# Patient Record
Sex: Female | Born: 1995
Health system: Southern US, Community
[De-identification: ages and names within clinical notes are randomized; demographics above are authoritative.]

## PROBLEM LIST (undated history)

## (undated) DIAGNOSIS — J039 Acute tonsillitis, unspecified: Secondary | ICD-10-CM

## (undated) DIAGNOSIS — K219 Gastro-esophageal reflux disease without esophagitis: Secondary | ICD-10-CM

## (undated) DIAGNOSIS — F329 Major depressive disorder, single episode, unspecified: Secondary | ICD-10-CM

## (undated) DIAGNOSIS — R7303 Prediabetes: Secondary | ICD-10-CM

## (undated) DIAGNOSIS — K760 Fatty (change of) liver, not elsewhere classified: Secondary | ICD-10-CM

## (undated) DIAGNOSIS — F32A Depression, unspecified: Secondary | ICD-10-CM

## (undated) DIAGNOSIS — D649 Anemia, unspecified: Secondary | ICD-10-CM

## (undated) DIAGNOSIS — S93409A Sprain of unspecified ligament of unspecified ankle, initial encounter: Secondary | ICD-10-CM

## (undated) HISTORY — DX: Prediabetes: R73.03

## (undated) HISTORY — DX: Anemia, unspecified: D64.9

## (undated) HISTORY — PX: EYE SURGERY: SHX253

---
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
## 2014-11-23 ENCOUNTER — Encounter (HOSPITAL_COMMUNITY): Payer: Self-pay

## 2014-11-23 ENCOUNTER — Emergency Department (HOSPITAL_COMMUNITY)
Admission: EM | Admit: 2014-11-23 | Discharge: 2014-11-23 | Disposition: A | Discharge status: Home / Self Care | Payer: Self-pay | Attending: Emergency Medicine | Admitting: Emergency Medicine

## 2014-11-23 DIAGNOSIS — J36 Peritonsillar abscess: Secondary | ICD-10-CM | POA: Insufficient documentation

## 2014-11-23 DIAGNOSIS — Z79899 Other long term (current) drug therapy: Secondary | ICD-10-CM | POA: Insufficient documentation

## 2014-11-23 MED ORDER — HYDROCODONE-ACETAMINOPHEN 7.5-325 MG/15ML PO SOLN: 10.0000 mL | Freq: Four times a day (QID) | ORAL | Status: DC | PRN

## 2014-11-23 MED ORDER — CLINDAMYCIN PHOSPHATE 900 MG/50ML IV SOLN
900.0000 mg | Freq: Once | INTRAVENOUS | Status: AC
  Administered 2014-11-23: 900 mg via INTRAVENOUS
  Filled 2014-11-23: qty 50

## 2014-11-23 MED ORDER — CLINDAMYCIN HCL 150 MG PO CAPS: 300.0000 mg | ORAL_CAPSULE | Freq: Four times a day (QID) | ORAL | Status: DC

## 2014-11-23 MED ORDER — METHYLPREDNISOLONE SODIUM SUCC 125 MG IJ SOLR
125.0000 mg | Freq: Once | INTRAMUSCULAR | Status: AC
  Administered 2014-11-23: 125 mg via INTRAVENOUS
  Filled 2014-11-23: qty 2

## 2014-11-23 MED ORDER — HYDROCODONE-ACETAMINOPHEN 7.5-325 MG/15ML PO SOLN
10.0000 mL | Freq: Once | ORAL | Status: AC
  Administered 2014-11-23: 10 mL via ORAL
  Filled 2014-11-23: qty 15

## 2014-11-23 NOTE — ED Provider Notes (Signed)
CSN: [id number]     Arrival date & time 11/23/14  1330 History   First MD Initiated Contact with Patient 11/23/14 1401     Chief Complaint  Patient presents with  . Sore Throat     (Consider location/radiation/quality/duration/timing/severity/associated sxs/prior Treatment) HPI   Dana Lane is a 19 y.o. female who presents to the Emergency Department complaining of left sided sore throat for four days.  She was seen yesterday at the local health dept and treated with IM Rocephin and Depo-Medrol and returned there today for recheck.  She was sent here for further evaluation of possible peri-tonsillar abscess.  She states her pain is worse today and she also c/o pain to the left neck.  She also states that she is not able to drink fluids or eat due to level of pain.  She denies fever, vomiting, or difficulty breathing.  She does report a history of frequent tonsillitis.     History reviewed. No pertinent past medical history. History reviewed. No pertinent past surgical history. No family history on file. History  Substance Use Topics  . Smoking status: Never Smoker   . Smokeless tobacco: Not on file  . Alcohol Use: No   OB History    No data available     Review of Systems  Constitutional: Negative for fever, chills, activity change and appetite change.  HENT: Positive for sore throat, trouble swallowing and voice change. Negative for congestion, drooling, ear pain and facial swelling.   Eyes: Negative for pain and visual disturbance.  Respiratory: Negative for cough and shortness of breath.   Gastrointestinal: Negative for nausea, vomiting and abdominal pain.  Musculoskeletal: Negative for arthralgias, neck pain and neck stiffness.  Skin: Negative for color change and rash.  Neurological: Negative for dizziness, facial asymmetry, speech difficulty, numbness and headaches.  Hematological: Negative for adenopathy.  All other systems reviewed and are negative.     Allergies   Review of patient's allergies indicates no known allergies.  Home Medications   Prior to Admission medications   Medication Sig Start Date End Date Taking? Authorizing Provider  pantoprazole (PROTONIX) 40 MG tablet Take 40 mg by mouth 2 (two) times daily.   Yes Historical Provider, MD   BP 145/85 mmHg  Pulse 120  Temp(Src) 98.1 F (36.7 C) (Oral)  Resp 18  Ht 5\' 6"  (1.676 m)  Wt 157 lb (71.215 kg)  BMI 25.35 kg/m2  SpO2 100%  LMP 11/12/2014   Physical Exam  Constitutional: She is oriented to person, place, and time. She appears well-developed and well-nourished. No distress.  HENT:  Head: Normocephalic and atraumatic.  Right Ear: Tympanic membrane and ear canal normal.  Left Ear: Tympanic membrane and ear canal normal.  Mouth/Throat: Uvula is midline and mucous membranes are normal. No trismus in the jaw. No uvula swelling. Posterior oropharyngeal edema, posterior oropharyngeal erythema and tonsillar abscesses present.  Erythema and edema of the left tonsil, exudate present with mild edema of the soft palate.  Uvula is midline.  Handles secretions well  Neck: Normal range of motion. Neck supple.  Cardiovascular: Normal rate, regular rhythm and normal heart sounds.   No murmur heard. Pulmonary/Chest: Effort normal and breath sounds normal. No respiratory distress.  Abdominal: There is no splenomegaly. There is no tenderness.  Musculoskeletal: Normal range of motion.  Lymphadenopathy:    She has no cervical adenopathy.  Neurological: She is alert and oriented to person, place, and time. She exhibits normal muscle tone. Coordination normal.  Skin:  Skin is warm and dry.  Nursing note and vitals reviewed.   ED Course  Procedures (including critical care time) Labs Review Labs Reviewed - No data to display  Imaging Review No results found.   EKG Interpretation None      MDM   Final diagnoses:  Peritonsillar abscess   Pt also seen by Dr. Rubin Payor care plan  discussed.   1515  Patient seen by Dr. Suszanne Conners, ENT.  He recommends IV steroids and clindamycin in the dept and rx clinda for 10 days. And he will see her in office for f/u next week. I&D not felt to be indicated at this time  1644 pt is resting comfortably.  Airway patent.  Uvula midline. Tolerating po fluids.  Feeling better, vitals stable.    Pt has tolerated po fluids and appears stable for d/c.  Pauline Aus, PA-C 11/25/14 2322  Benjiman Core, MD 11/27/14 [phone]

## 2014-11-23 NOTE — ED Notes (Signed)
Complain of sore throat and swelling since Monday night

## 2014-11-23 NOTE — ED Notes (Signed)
Sent from [facility]. Sore throat since Monday. Given Rocephin 1gm IM and depo medrol 80 mg IM on 6/1. Recheck today and say pt is worse.  Lt tonsil red with white spots present, ear hurts.  Alert,  Says unable to swallow due to pain  Swelling of lt ant cervical nodes.  .Marland Kitchen

## 2014-11-23 NOTE — Discharge Instructions (Signed)
Peritonsillar Abscess °Peritonsillar abscess is a collection of yellowish white fluid (pus) in the back of the throat. This fluid forms behind the tonsils. The treatment is most often drainage. This is done by: °· Putting a needle into the abscess. °· Cutting and draining the abscess. °HOME CARE °· If your abscess was drained today: °¨ Mix 1 teaspoon of salt in 8 ounces of warm water for gargling. °¨ Gargle the warm salt water. °¨ Gargle 4 times per day or as needed for comfort. Do not swallow this mixture. °· Rest in bed as needed. °· Return to normal activity as soon as you can. °· Apply cold to your neck for pain relief. °¨ Put ice in a plastic bag. °¨ Place a towel between your skin and the bag. °¨ Leave the ice on for 15-20 minutes at a time, 03-04 times a day. °· Eat a soft or liquid diet. Drink cold fluids. Cold fluids will soothe and take puffiness (swelling) down. °· Only take medicine as told by your doctor. °· Take all medicine as told. °GET HELP RIGHT AWAY IF:  °· You are coughing up or throwing up (vomiting) blood. °· Your throat pain is severe and pain medicine does not help it. °· You have trouble talking or breathing. °· You have trouble swallowing or eating. °· You find it easier to breathe while leaning forward. °· You have pain that gets worse. °· You have pain, puffiness, redness, or drainage in your throat. °· You have a fever. °· You become dizzy, have a headache, have low energy, or feel sick. °· You have signs of body fluid loss (dehydration). This includes lightheadedness when standing, peeing (urinating) less, a fast heart rate, or dry mouth and nose. °· You start to drool. °MAKE SURE YOU: °· Understand these instructions. °· Will watch your condition. °· Will get help if you are not doing well or get worse. °Document Released: 05/28/2009 Document Revised: 09/01/2011 Document Reviewed: 05/28/2009 °ExitCare® Patient Information ©2015 [employer]. This information is not intended to replace  advice given to you by your health care provider. Make sure you discuss any questions you have with your health care provider. ° °

## 2014-12-07 ENCOUNTER — Emergency Department (HOSPITAL_COMMUNITY)
Admission: EM | Admit: 2014-12-07 | Discharge: 2014-12-07 | Disposition: A | Discharge status: Home / Self Care | Payer: Self-pay | Attending: Emergency Medicine | Admitting: Emergency Medicine

## 2014-12-07 ENCOUNTER — Encounter (HOSPITAL_COMMUNITY): Payer: Self-pay | Admitting: Cardiology

## 2014-12-07 DIAGNOSIS — Z79899 Other long term (current) drug therapy: Secondary | ICD-10-CM | POA: Insufficient documentation

## 2014-12-07 DIAGNOSIS — L309 Dermatitis, unspecified: Secondary | ICD-10-CM | POA: Insufficient documentation

## 2014-12-07 DIAGNOSIS — Z8709 Personal history of other diseases of the respiratory system: Secondary | ICD-10-CM | POA: Insufficient documentation

## 2014-12-07 MED ORDER — PREDNISONE 20 MG PO TABS: 40.0000 mg | ORAL_TABLET | Freq: Every day | ORAL | Status: DC

## 2014-12-07 NOTE — ED Notes (Signed)
Patient with no complaints at this time. Respirations even and unlabored. Skin warm/dry. Discharge instructions reviewed with patient at this time. Patient given opportunity to voice concerns/ask questions. Patient discharged at this time and left Emergency Department with steady gait.   

## 2014-12-07 NOTE — Discharge Instructions (Signed)
Drug Rash Skin reactions can be caused by several different drugs. Allergy to the medicine can cause itching, hives, and other rashes. Sun exposure causes a red rash with some medicines. Mononucleosis virus can cause a similar red rash when you are taking antibiotics. Sometimes, the rash may be accompanied by pain. The drug rash may happen with new drugs or with medicines that you have been taking for a while. The rash cannot be spread from person to person. In most cases, the symptoms of a drug rash are gone within a few days of stopping the medicine. Your rash, including hives (urticaria), is most likely from the following medicines:  Antibiotics or antimicrobials.  Anticonvulsants or seizure medicines.  Antihypertensives or blood pressure medicines.  Antimalarials.  Antidepressants or depression medicines.  Antianxiety drugs.  Diuretics or water pills.  Nonsteroidal anti-inflammatory drugs.  Simvastatin.  Lithium.  Omeprazole.  Allopurinol.  Pseudoephedrine.  Amiodarone.  Packed red blood cells, when you get a blood transfusion.  Contrast media, such as when getting an imaging test (CT or CAT scan). This drug list is not all inclusive, but drug rashes have been reported with all the medicines listed above. Your caregiver will tell you which medicines to avoid. If you react to a medicine, a similar or worse reaction can occur the next time you take it. If you need to stop taking an antibiotic because of a drug rash, an alternative antibiotic may be needed to get rid of your infection. Antihistamine or cortisone drugs may be prescribed to help relieve your symptoms. Stay out of the sun until the rash is completely gone.  Be sure to let your caregiver know about your drug reaction. Do not take this medicine in the future. Call your caregiver if your drug rash does not improve within 3 to 4 days. SEEK IMMEDIATE MEDICAL CARE IF:   You develop breathing problems, swelling in the  throat, or wheezing.  You have weakness, fainting, fever, and muscle or joint pains.  You develop blisters or peeling of skin, especially around the mouth. Document Released: 07/17/2004 Document Revised: 10/24/2013 Document Reviewed: 04/27/2008 ExitCare Patient Information 2015 [employer]. This information is not intended to replace advice given to you by your health care provider. Make sure you discuss any questions you have with your health care provider.  

## 2014-12-07 NOTE — ED Notes (Signed)
Rash to hands ,  And legs since Tuesday.  Recently treated for tonsillitis.  States her throat still feels swollen.

## 2014-12-08 NOTE — ED Provider Notes (Signed)
CSN: [id number]     Arrival date & time 12/07/14  1516 History   First MD Initiated Contact with Patient 12/07/14 1534     Chief Complaint  Patient presents with  . Rash     (Consider location/radiation/quality/duration/timing/severity/associated sxs/prior Treatment) HPI   Dana Lane is a 19 y.o. female who presents to the Emergency Department complaining of rash and itching for two days.  She recently finished a course of clindamycin given here for peritonsillar abscess and was also given flagyl by her PMD for a "rash" that she developed in her mouth.  Rash developed after abx were completed.  She denies fever, abd pain, vomiting, neck pain, headaches or nasal congestion.    History reviewed. No pertinent past medical history. History reviewed. No pertinent past surgical history. History reviewed. No pertinent family history. History  Substance Use Topics  . Smoking status: Never Smoker   . Smokeless tobacco: Not on file  . Alcohol Use: No   OB History    No data available     Review of Systems  Constitutional: Negative for fever, chills, activity change and appetite change.  HENT: Negative for facial swelling, sore throat and trouble swallowing.   Respiratory: Negative for chest tightness, shortness of breath and wheezing.   Musculoskeletal: Negative for neck pain and neck stiffness.  Skin: Positive for rash. Negative for wound.  Neurological: Negative for dizziness, weakness, numbness and headaches.  All other systems reviewed and are negative.     Allergies  Chocolate and Eggs or egg-derived products  Home Medications   Prior to Admission medications   Medication Sig Start Date End Date Taking? Authorizing Provider  pantoprazole (PROTONIX) 40 MG tablet Take 40 mg by mouth 2 (two) times daily.   Yes Historical Provider, MD  predniSONE (DELTASONE) 20 MG tablet Take 2 tablets (40 mg total) by mouth daily. For 5 days 12/07/14   Tammy Triplett, PA-C   BP 124/83 mmHg   Pulse 94  Temp(Src) 98 F (36.7 C) (Oral)  Resp 18  Ht 5\' 4"  (1.626 m)  Wt 157 lb (71.215 kg)  BMI 26.94 kg/m2  SpO2 100%  LMP 11/12/2014 Physical Exam  Constitutional: She is oriented to person, place, and time. She appears well-developed and well-nourished. No distress.  HENT:  Head: Normocephalic and atraumatic.  Nose: No mucosal edema.  Mouth/Throat: Uvula is midline, oropharynx is clear and moist and mucous membranes are normal. No oral lesions. No trismus in the jaw. No uvula swelling. No oropharyngeal exudate, posterior oropharyngeal edema, posterior oropharyngeal erythema or tonsillar abscesses.  Neck: Normal range of motion. Neck supple.  Cardiovascular: Normal rate, regular rhythm, normal heart sounds and intact distal pulses.   No murmur heard. Pulmonary/Chest: Effort normal and breath sounds normal. No respiratory distress. She has no wheezes.  Musculoskeletal: She exhibits no edema or tenderness.  Lymphadenopathy:    She has no cervical adenopathy.  Neurological: She is alert and oriented to person, place, and time. She exhibits normal muscle tone. Coordination normal.  Skin: Skin is warm. Rash noted. There is erythema.  Erythematous papular lesions to bilateral wrists and lower extremities. No edema, pustules   Nursing note and vitals reviewed.   ED Course  Procedures (including critical care time) Labs Review Labs Reviewed - No data to display  Imaging Review No results found.   EKG Interpretation None      MDM   Final diagnoses:  Dermatitis    Pt well appearing.  Vitals stable.  Rash to  bilateral legs and wrists. Has recently been treated with clindamycin and flagyl.  Airway is patent. No edema , uvula midline.  Agrees to tx with prednisone and close f/u with PMD.      Pauline Aus, PA-C 12/08/14 2226  Bethann Berkshire, MD 12/09/14 [phone]

## 2015-05-04 ENCOUNTER — Emergency Department (HOSPITAL_COMMUNITY)
Admission: EM | Admit: 2015-05-04 | Discharge: 2015-05-04 | Disposition: A | Discharge status: Home / Self Care | Payer: Self-pay | Attending: Emergency Medicine | Admitting: Emergency Medicine

## 2015-05-04 ENCOUNTER — Encounter (HOSPITAL_COMMUNITY): Payer: Self-pay | Admitting: Emergency Medicine

## 2015-05-04 DIAGNOSIS — Z79899 Other long term (current) drug therapy: Secondary | ICD-10-CM | POA: Insufficient documentation

## 2015-05-04 DIAGNOSIS — J069 Acute upper respiratory infection, unspecified: Secondary | ICD-10-CM | POA: Insufficient documentation

## 2015-05-04 HISTORY — DX: Acute tonsillitis, unspecified: J03.90

## 2015-05-04 LAB — RAPID STREP SCREEN (MED CTR MEBANE ONLY): Streptococcus, Group A Screen (Direct): NEGATIVE

## 2015-05-04 NOTE — ED Notes (Signed)
Pt c/o of on-going sore throat since Friday. Pt reports pain intensified last night. Hx of tonsillitis. Airway patent.

## 2015-05-04 NOTE — ED Provider Notes (Signed)
CSN: [id number]     Arrival date & time 05/04/15  [id number] History  By signing my name below, I, Dana Lane, attest that this documentation has been prepared under the direction and in the presence of Lorre NickAnthony Allen, MD. Electronically Signed: Lyndel SafeKaitlyn Lane, ED Scribe. 05/04/2015. 10:09 AM.   Chief Complaint  Patient presents with  . Sore Throat   The history is provided by the patient. No language interpreter was used.   HPI Comments: Dana Lane is a 19 y.o. female, with a PMhx of tonsillitis, who presents to the Emergency Department complaining of a constant sore throat that has been present for 7 days but progressively worsened last night. Pt is able to tolerate secretions. She has been gargling with salt water without significant relief but has not taken any alleviating medication. She associates nasal congestion. Denies fevers, cough, sick contacts, or any urinary symptoms.   Past Medical History  Diagnosis Date  . Tonsillitis    Past Surgical History  Procedure Laterality Date  . Eye surgery     No family history on file. Social History  Substance Use Topics  . Smoking status: Never Smoker   . Smokeless tobacco: None  . Alcohol Use: No   OB History    No data available     Review of Systems  Constitutional: Negative for fever.  HENT: Positive for congestion (nasal) and sore throat. Negative for trouble swallowing.   Respiratory: Negative for cough.   Genitourinary: Negative for dysuria, urgency and frequency.  All other systems reviewed and are negative.  Allergies  Chocolate and Eggs or egg-derived products  Home Medications   Prior to Admission medications   Medication Sig Start Date End Date Taking? Authorizing Provider  pantoprazole (PROTONIX) 40 MG tablet Take 40 mg by mouth 2 (two) times daily.    Historical Provider, MD  predniSONE (DELTASONE) 20 MG tablet Take 2 tablets (40 mg total) by mouth daily. For 5 days 12/07/14   Tammy Triplett, PA-C   BP  130/97 mmHg  Pulse 84  Temp(Src) 97.9 F (36.6 C) (Oral)  Resp 16  Ht 5\' 4"  (1.626 m)  Wt 165 lb (74.844 kg)  BMI 28.31 kg/m2  SpO2 99%  LMP 04/24/2015 Physical Exam  Constitutional: She is oriented to person, place, and time. She appears well-developed and well-nourished.  Non-toxic appearance. No distress.  HENT:  Head: Normocephalic and atraumatic.  Right Ear: External ear normal.  Left Ear: External ear normal.  Mouth/Throat: No oropharyngeal exudate.  Posterior oropharynx with slight erythema, no exudates, no masses appreciated.   Eyes: Conjunctivae, EOM and lids are normal. Pupils are equal, round, and reactive to light.  Neck: Normal range of motion. Neck supple. No tracheal deviation present. No thyroid mass present.  Cardiovascular: Normal rate, regular rhythm and normal heart sounds.  Exam reveals no gallop.   No murmur heard. Pulmonary/Chest: Effort normal and breath sounds normal. No stridor. No respiratory distress. She has no decreased breath sounds. She has no wheezes. She has no rhonchi. She has no rales.  Abdominal: Normal appearance. There is no CVA tenderness.  Musculoskeletal: Normal range of motion.  Lymphadenopathy:    She has no cervical adenopathy.  Neurological: She is alert and oriented to person, place, and time. She has normal strength. No cranial nerve deficit or sensory deficit. GCS eye subscore is 4. GCS verbal subscore is 5. GCS motor subscore is 6.  Skin: Skin is warm and dry. No abrasion and no rash noted.  Psychiatric:  She has a normal mood and affect. Her speech is normal and behavior is normal.  Nursing note and vitals reviewed.   ED Course  Procedures  DIAGNOSTIC STUDIES: Oxygen Saturation is 99% on RA, normal by my interpretation.    COORDINATION OF CARE: 10:07 AM Discussed treatment plan with pt at bedside and pt agreed to plan. Will order rapid strep screening.    Labs Review Labs Reviewed  RAPID STREP SCREEN (NOT AT [facility])   I have  personally reviewed and evaluated these lab results as part of my medical decision-making.   MDM   Final diagnoses:  None    I personally performed the services described in this documentation, which was scribed in my presence. The recorded information has been reviewed and is accurate.   Strep test negative. Patient with likely URI and will be discharged   Lorre Nick, MD 05/04/15 1103

## 2015-05-04 NOTE — Discharge Instructions (Signed)
Upper Respiratory Infection, Adult Most upper respiratory infections (URIs) are a viral infection of the air passages leading to the lungs. A URI affects the nose, throat, and upper air passages. The most common type of URI is nasopharyngitis and is typically referred to as "the common cold." URIs run their course and usually go away on their own. Most of the time, a URI does not require medical attention, but sometimes a bacterial infection in the upper airways can follow a viral infection. This is called a secondary infection. Sinus and middle ear infections are common types of secondary upper respiratory infections. Bacterial pneumonia can also complicate a URI. A URI can worsen asthma and chronic obstructive pulmonary disease (COPD). Sometimes, these complications can require emergency medical care and may be life threatening.  CAUSES Almost all URIs are caused by viruses. A virus is a type of germ and can spread from one person to another.  RISKS FACTORS You may be at risk for a URI if:   You smoke.   You have chronic heart or lung disease.  You have a weakened defense (immune) system.   You are very young or very old.   You have nasal allergies or asthma.  You work in crowded or poorly ventilated areas.  You work in health care facilities or schools. SIGNS AND SYMPTOMS  Symptoms typically develop 2-3 days after you come in contact with a cold virus. Most viral URIs last 7-10 days. However, viral URIs from the influenza virus (flu virus) can last 14-18 days and are typically more severe. Symptoms may include:   Runny or stuffy (congested) nose.   Sneezing.   Cough.   Sore throat.   Headache.   Fatigue.   Fever.   Loss of appetite.   Pain in your forehead, behind your eyes, and over your cheekbones (sinus pain).  Muscle aches.  DIAGNOSIS  Your health care provider may diagnose a URI by:  Physical exam.  Tests to check that your symptoms are not due to  another condition such as:  Strep throat.  Sinusitis.  Pneumonia.  Asthma. TREATMENT  A URI goes away on its own with time. It cannot be cured with medicines, but medicines may be prescribed or recommended to relieve symptoms. Medicines may help:  Reduce your fever.  Reduce your cough.  Relieve nasal congestion. HOME CARE INSTRUCTIONS   Take medicines only as directed by your health care provider.   Gargle warm saltwater or take cough drops to comfort your throat as directed by your health care provider.  Use a warm mist humidifier or inhale steam from a shower to increase air moisture. This may make it easier to breathe.  Drink enough fluid to keep your urine clear or pale yellow.   Eat soups and other clear broths and maintain good nutrition.   Rest as needed.   Return to work when your temperature has returned to normal or as your health care provider advises. You may need to stay home longer to avoid infecting others. You can also use a face mask and careful hand washing to prevent spread of the virus.  Increase the usage of your inhaler if you have asthma.   Do not use any tobacco products, including cigarettes, chewing tobacco, or electronic cigarettes. If you need help quitting, ask your health care provider. PREVENTION  The best way to protect yourself from getting a cold is to practice good hygiene.   Avoid oral or hand contact with people with cold   symptoms.   Wash your hands often if contact occurs.  There is no clear evidence that vitamin C, vitamin E, echinacea, or exercise reduces the chance of developing a cold. However, it is always recommended to get plenty of rest, exercise, and practice good nutrition.  SEEK MEDICAL CARE IF:   You are getting worse rather than better.   Your symptoms are not controlled by medicine.   You have chills.  You have worsening shortness of breath.  You have brown or red mucus.  You have yellow or brown nasal  discharge.  You have pain in your face, especially when you bend forward.  You have a fever.  You have swollen neck glands.  You have pain while swallowing.  You have white areas in the back of your throat. SEEK IMMEDIATE MEDICAL CARE IF:   You have severe or persistent:  Headache.  Ear pain.  Sinus pain.  Chest pain.  You have chronic lung disease and any of the following:  Wheezing.  Prolonged cough.  Coughing up blood.  A change in your usual mucus.  You have a stiff neck.  You have changes in your:  Vision.  Hearing.  Thinking.  Mood. MAKE SURE YOU:   Understand these instructions.  Will watch your condition.  Will get help right away if you are not doing well or get worse.   This information is not intended to replace advice given to you by your health care provider. Make sure you discuss any questions you have with your health care provider.   Document Released: 12/03/2000 Document Revised: 10/24/2014 Document Reviewed: 09/14/2013 Elsevier Interactive Patient Education 2016 [employer]  

## 2015-05-06 LAB — CULTURE, GROUP A STREP: Strep A Culture: NEGATIVE

## 2015-05-22 ENCOUNTER — Encounter: Payer: Self-pay | Admitting: Physician Assistant

## 2015-05-22 ENCOUNTER — Ambulatory Visit: Payer: Self-pay | Admitting: Physician Assistant

## 2015-05-22 VITALS — BP 122/80 | HR 86 | Temp 98.1°F | Ht 64.0 in | Wt 159.6 lb

## 2015-05-22 DIAGNOSIS — L089 Local infection of the skin and subcutaneous tissue, unspecified: Secondary | ICD-10-CM

## 2015-05-22 MED ORDER — SULFAMETHOXAZOLE-TRIMETHOPRIM 800-160 MG PO TABS: 1.0000 | ORAL_TABLET | Freq: Two times a day (BID) | ORAL | Status: AC

## 2015-05-22 NOTE — Progress Notes (Signed)
BP 122/80 mmHg  Pulse 86  Temp(Src) 98.1 F (36.7 C)  Ht 5\' 4"  (1.626 m)  Wt 159 lb 9.6 oz (72.394 kg)  BMI 27.38 kg/m2  SpO2 98%  LMP 04/24/2015   Subjective:    Patient ID: Dana Lane, female    DOB: 11/28/1995, 19 y.o.   MRN: [id number]  HPI: Dana Lane is a 19 y.o. female presenting on 05/22/2015 for Skin Problem   HPI  Chief Complaint  Patient presents with  . Skin Problem     sores under arms since Sunday, 2 household members have MRSA    Pt has been using another person's bactroban but she says she can't tell that it is helping any.  Relevant past medical, surgical, family and social history reviewed and updated as indicated. Interim medical history since our last visit reviewed. Allergies and medications reviewed and updated.   Current outpatient prescriptions:  .  guaiFENesin-dextromethorphan (ROBITUSSIN DM) 100-10 MG/5ML syrup, Take 15 mLs by mouth every 4 (four) hours as needed for cough., Disp: , Rfl:  .  Multiple Vitamin (MULTIVITAMIN WITH MINERALS) TABS tablet, Take 1 tablet by mouth daily., Disp: , Rfl:  .  mupirocin ointment (BACTROBAN) 2 %, Apply 1 application topically 2 (two) times daily., Disp: , Rfl:  .  pantoprazole (PROTONIX) 40 MG tablet, Take 40 mg by mouth daily as needed. , Disp: , Rfl:    Review of Systems  Constitutional: Negative for fever, chills, diaphoresis, appetite change, fatigue and unexpected weight change.  HENT: Positive for congestion and sneezing. Negative for dental problem, drooling, ear pain, facial swelling, hearing loss, mouth sores, sore throat, trouble swallowing and voice change.   Eyes: Negative for pain, discharge, redness, itching and visual disturbance.  Respiratory: Negative for cough, choking, shortness of breath and wheezing.   Cardiovascular: Negative for chest pain, palpitations and leg swelling.  Gastrointestinal: Negative for vomiting, abdominal pain, diarrhea, constipation and blood in stool.  Endocrine:  Negative for cold intolerance, heat intolerance and polydipsia.  Genitourinary: Negative for dysuria, hematuria and decreased urine volume.  Musculoskeletal: Negative for back pain, arthralgias and gait problem.  Skin: Positive for rash.  Allergic/Immunologic: Negative for environmental allergies.  Neurological: Negative for seizures, syncope, light-headedness and headaches.  Hematological: Negative for adenopathy.  Psychiatric/Behavioral: Negative for suicidal ideas, dysphoric mood and agitation. The patient is not nervous/anxious.     Per HPI unless specifically indicated above     Objective:    BP 122/80 mmHg  Pulse 86  Temp(Src) 98.1 F (36.7 C)  Ht 5\' 4"  (1.626 m)  Wt 159 lb 9.6 oz (72.394 kg)  BMI 27.38 kg/m2  SpO2 98%  LMP 04/24/2015  Wt Readings from Last 3 Encounters:  05/22/15 159 lb 9.6 oz (72.394 kg)  05/04/15 165 lb (74.844 kg)  12/07/14 157 lb (71.215 kg)    Physical Exam  Constitutional: She is oriented to person, place, and time. She appears well-developed and well-nourished.  HENT:  Head: Normocephalic and atraumatic.  Pulmonary/Chest: Effort normal.  Neurological: She is alert and oriented to person, place, and time.  Skin: Skin is warm and dry. Rash noted.  approx 8 Red lesions R axilla.  In various states from one is clear colorless blister to some with scabbing.  Lesions vary in size from approx 1 cm to about 1 inch.  Psychiatric: She has a normal mood and affect. Her behavior is normal.  Vitals reviewed.       Assessment & Plan:   Encounter Diagnosis  Name Primary?  . Local skin infection Yes    -Can continue ointment if desired. rx septra. -f/u as scheduled. rto sooner prn

## 2015-06-07 ENCOUNTER — Encounter: Payer: Self-pay | Admitting: Physician Assistant

## 2015-06-07 ENCOUNTER — Ambulatory Visit: Payer: Self-pay | Admitting: Physician Assistant

## 2015-06-07 VITALS — BP 106/78 | HR 82 | Temp 97.9°F | Ht 64.0 in | Wt 162.7 lb

## 2015-06-07 DIAGNOSIS — K219 Gastro-esophageal reflux disease without esophagitis: Secondary | ICD-10-CM

## 2015-06-07 MED ORDER — OMEPRAZOLE 40 MG PO CPDR: 40.0000 mg | DELAYED_RELEASE_CAPSULE | Freq: Every day | ORAL | Status: DC

## 2015-06-07 NOTE — Progress Notes (Signed)
BP 106/78 mmHg  Pulse 82  Temp(Src) 97.9 F (36.6 C)  Ht  (1.626 m)  Wt 162 lb 11.2 oz (73.8 kg)  BMI 27.91 kg/m2  SpO2 99%   Subjective:    Patient ID: Dana Lane, female    DOB: 1995/09/22, 19 y.o.   MRN: [id number]  HPI: Dana Lane is a 19 y.o. female presenting on 06/07/2015 for Gastroesophageal Reflux   HPI Pt states the protonix has been working well.  She doesn't take it every day b/c she can't afford to but it works well when she takes it.  Relevant past medical, surgical, family and social history reviewed and updated as indicated. Interim medical history since our last visit reviewed. Allergies and medications reviewed and updated.  Current outpatient prescriptions:  .  guaiFENesin-dextromethorphan (ROBITUSSIN DM) 100-10 MG/5ML syrup, Take 15 mLs by mouth every 4 (four) hours as needed for cough., Disp: , Rfl:  .  Multiple Vitamin (MULTIVITAMIN WITH MINERALS) TABS tablet, Take 1 tablet by mouth daily., Disp: , Rfl:  .  pantoprazole (PROTONIX) 40 MG tablet, Take 40 mg by mouth daily as needed. , Disp: , Rfl:    Review of Systems  Constitutional: Negative for fever, chills, diaphoresis, appetite change, fatigue and unexpected weight change.  HENT: Positive for sneezing. Negative for congestion, dental problem, drooling, ear pain, facial swelling, hearing loss, mouth sores, sore throat, trouble swallowing and voice change.   Eyes: Negative for pain, discharge, redness, itching and visual disturbance.  Respiratory: Positive for cough. Negative for choking, shortness of breath and wheezing.   Cardiovascular: Negative for chest pain, palpitations and leg swelling.  Gastrointestinal: Negative for vomiting, abdominal pain, diarrhea, constipation and blood in stool.  Endocrine: Negative for cold intolerance, heat intolerance and polydipsia.  Genitourinary: Negative for dysuria, hematuria and decreased urine volume.  Musculoskeletal: Negative for back pain, arthralgias  and gait problem.  Skin: Negative for rash.  Allergic/Immunologic: Negative for environmental allergies.  Neurological: Negative for seizures, syncope, light-headedness and headaches.  Hematological: Negative for adenopathy.  Psychiatric/Behavioral: Negative for suicidal ideas, dysphoric mood and agitation. The patient is not nervous/anxious.     Per HPI unless specifically indicated above     Objective:    BP 106/78 mmHg  Pulse 82  Temp(Src) 97.9 F (36.6 C)  Ht  (1.626 m)  Wt 162 lb 11.2 oz (73.8 kg)  BMI 27.91 kg/m2  SpO2 99%  Wt Readings from Last 3 Encounters:  06/07/15 162 lb 11.2 oz (73.8 kg)  05/22/15 159 lb 9.6 oz (72.394 kg)  05/04/15 165 lb (74.844 kg)    Physical Exam  Constitutional: She is oriented to person, place, and time. She appears well-developed and well-nourished.  HENT:  Head: Normocephalic and atraumatic.  Neck: Neck supple.  Cardiovascular: Normal rate and regular rhythm.   Pulmonary/Chest: Effort normal and breath sounds normal.  Abdominal: Soft. Bowel sounds are normal. She exhibits no mass. There is no tenderness.  Musculoskeletal: She exhibits no edema.  Lymphadenopathy:    She has no cervical adenopathy.  Neurological: She is alert and oriented to person, place, and time.  Skin: Skin is warm and dry.  Psychiatric: She has a normal mood and affect. Her behavior is normal.  Vitals reviewed.       Assessment & Plan:   Encounter Diagnosis  Name Primary?  . Gastroesophageal reflux disease, esophagitis presence not specified Yes    -will get pt signed up for [facility] and order omeprazole so she can take  her ppi regularly -f/u 3 mo. rto sooner prn

## 2015-06-18 DIAGNOSIS — K219 Gastro-esophageal reflux disease without esophagitis: Secondary | ICD-10-CM | POA: Insufficient documentation

## 2015-06-18 HISTORY — DX: Gastro-esophageal reflux disease without esophagitis: K21.9

## 2015-06-27 ENCOUNTER — Ambulatory Visit: Payer: Self-pay | Admitting: Physician Assistant

## 2015-06-27 ENCOUNTER — Encounter: Payer: Self-pay | Admitting: Physician Assistant

## 2015-06-27 VITALS — BP 112/76 | HR 98 | Temp 98.1°F | Ht 64.0 in | Wt 168.4 lb

## 2015-06-27 DIAGNOSIS — N3090 Cystitis, unspecified without hematuria: Secondary | ICD-10-CM

## 2015-06-27 DIAGNOSIS — K219 Gastro-esophageal reflux disease without esophagitis: Secondary | ICD-10-CM

## 2015-06-27 DIAGNOSIS — R1084 Generalized abdominal pain: Secondary | ICD-10-CM

## 2015-06-27 LAB — POCT URINALYSIS DIPSTICK
Bilirubin, UA: NEGATIVE
Glucose, UA: NEGATIVE
Ketones, UA: NEGATIVE
Nitrite, UA: NEGATIVE
Protein, UA: NEGATIVE
Spec Grav, UA: 1.01
Urobilinogen, UA: 1
pH, UA: 7

## 2015-06-27 MED ORDER — CIPROFLOXACIN HCL 500 MG PO TABS: 500.0000 mg | ORAL_TABLET | Freq: Two times a day (BID) | ORAL | Status: AC

## 2015-06-27 NOTE — Progress Notes (Signed)
BP 112/76 mmHg  Pulse 98  Temp(Src) 98.1 F (36.7 C)  Ht 5\' 4"  (1.626 m)  Wt 168 lb 6.4 oz (76.386 kg)  BMI 28.89 kg/m2  SpO2 99%   Subjective:    Patient ID: Dana Lane, female    DOB: Oct 26, 1995, 20 y.o.   MRN: [id number]  HPI: Dana Lane is a 20 y.o. female presenting on 06/27/2015 for Chest Pain and Abdominal Pain   HPI States chest started hurting one week ago. abd pain started Monday (2 d ago).  She says she took some tylenol w/o relief.  Denies cough, fever, nausea, vomiting no other symptoms.  CP is constant and never goes away.    Pt just started on the omeprazole on Sunday (she had to wait for it to come in the mail) so she was off her PPI for some time.  LMP 12/27-31.  Her BMs have been normal and regular.  Relevant past medical, surgical, family and social history reviewed and updated as indicated. Interim medical history since our last visit reviewed. Allergies and medications reviewed and updated.   Current outpatient prescriptions:  Marland Kitchen  Multiple Vitamin (MULTIVITAMIN WITH MINERALS) TABS tablet, Take 1 tablet by mouth daily., Disp: , Rfl:  .  omeprazole (PRILOSEC) 40 MG capsule, Take 1 capsule (40 mg total) by mouth daily., Disp: 90 capsule, Rfl: 2   Review of Systems  Constitutional: Negative for fever, chills, diaphoresis, appetite change, fatigue and unexpected weight change.  HENT: Negative for congestion, dental problem, drooling, ear pain, facial swelling, hearing loss, mouth sores, sneezing, sore throat, trouble swallowing and voice change.   Eyes: Negative for pain, discharge, redness, itching and visual disturbance.  Respiratory: Negative for cough, choking, shortness of breath and wheezing.   Cardiovascular: Positive for chest pain. Negative for palpitations and leg swelling.  Gastrointestinal: Positive for abdominal pain. Negative for vomiting, diarrhea, constipation and blood in stool.  Endocrine: Negative for cold intolerance, heat intolerance and  polydipsia.  Genitourinary: Negative for dysuria, hematuria and decreased urine volume.  Musculoskeletal: Negative for back pain, arthralgias and gait problem.  Skin: Negative for rash.  Allergic/Immunologic: Negative for environmental allergies.  Neurological: Negative for seizures, syncope, light-headedness and headaches.  Hematological: Negative for adenopathy.  Psychiatric/Behavioral: Negative for suicidal ideas, dysphoric mood and agitation. The patient is not nervous/anxious.     Per HPI unless specifically indicated above     Objective:    BP 112/76 mmHg  Pulse 98  Temp(Src) 98.1 F (36.7 C)  Ht 5\' 4"  (1.626 m)  Wt 168 lb 6.4 oz (76.386 kg)  BMI 28.89 kg/m2  SpO2 99%  Wt Readings from Last 3 Encounters:  06/27/15 168 lb 6.4 oz (76.386 kg)  06/07/15 162 lb 11.2 oz (73.8 kg)  05/22/15 159 lb 9.6 oz (72.394 kg)    Physical Exam  Constitutional: She is oriented to person, place, and time. She appears well-developed and well-nourished.  HENT:  Head: Normocephalic and atraumatic.  Neck: Neck supple.  Cardiovascular: Normal rate and regular rhythm.  Exam reveals no gallop and no friction rub.   No murmur heard. Pulmonary/Chest: Effort normal and breath sounds normal. No respiratory distress. She has no wheezes. She has no rales. She exhibits no tenderness.  Abdominal: Soft. Bowel sounds are normal. She exhibits no distension and no mass. There is no hepatosplenomegaly. There is no tenderness. There is no rebound, no guarding and no CVA tenderness.  Musculoskeletal: She exhibits no edema.  Lymphadenopathy:    She has no  cervical adenopathy.  Neurological: She is alert and oriented to person, place, and time.  Skin: Skin is warm and dry.  Psychiatric: She has a normal mood and affect. Her behavior is normal.  Vitals reviewed.   Urinalysis +blo, leuks. cloudy  EKG  NSR without st-t changes     Assessment & Plan:   Encounter Diagnoses  Name Primary?  . Generalized  abdominal pain Yes  . Gastroesophageal reflux disease, esophagitis presence not specified   . Cystitis     rx cipro for urine.  Likely pain from GERD left untreated until 3 days ago. Cont omeprazole.  F/u march as scheduled but RTO if pain worsens or new symptoms develop.

## 2015-09-06 ENCOUNTER — Encounter: Payer: Self-pay | Admitting: Physician Assistant

## 2015-09-06 ENCOUNTER — Ambulatory Visit: Payer: Self-pay | Admitting: Physician Assistant

## 2015-09-06 VITALS — BP 100/70 | HR 95 | Temp 97.5°F | Ht 64.0 in | Wt 164.2 lb

## 2015-09-06 DIAGNOSIS — R0789 Other chest pain: Secondary | ICD-10-CM

## 2015-09-06 DIAGNOSIS — K219 Gastro-esophageal reflux disease without esophagitis: Secondary | ICD-10-CM

## 2015-09-06 NOTE — Progress Notes (Signed)
BP 100/70 mmHg  Pulse 95  Temp(Src) 97.5 F (36.4 C)  Ht 5\' 4"  (1.626 m)  Wt 164 lb 3.2 oz (74.481 kg)  BMI 28.17 kg/m2  SpO2 99%   Subjective:    Patient ID: Dana Lane, female    DOB: 08/26/1995, 20 y.o.   MRN: [id number]  HPI: Dana LewBess Mansour is a 20 y.o. female presenting on 09/06/2015 for Gastroesophageal Reflux   HPI   She is still taking her omeprazole.  States still has cp at times. Hurts once/week.  Lasts the entire day.  No sob but it hurts to breath when her chest is hurting.   Moving arms makes chest hurt when it is hurting.  No n/v/d.   Also insomnia- about a month.  Tried otc sleep aid.    Relevant past medical, surgical, family and social history reviewed and updated as indicated. Interim medical history since our last visit reviewed. Allergies and medications reviewed and updated.    Current outpatient prescriptions:  .  omeprazole (PRILOSEC) 40 MG capsule, Take 1 capsule (40 mg total) by mouth daily., Disp: 90 capsule, Rfl: 2  Review of Systems  Constitutional: Negative for fever, chills, diaphoresis, appetite change, fatigue and unexpected weight change.  HENT: Negative for congestion, dental problem, drooling, ear pain, facial swelling, hearing loss, mouth sores, sneezing, sore throat, trouble swallowing and voice change.   Eyes: Positive for pain and itching. Negative for discharge, redness and visual disturbance.  Respiratory: Negative for cough, choking, shortness of breath and wheezing.   Cardiovascular: Positive for chest pain. Negative for palpitations and leg swelling.  Gastrointestinal: Negative for vomiting, abdominal pain, diarrhea, constipation and blood in stool.  Endocrine: Negative for cold intolerance, heat intolerance and polydipsia.  Genitourinary: Negative for dysuria, hematuria and decreased urine volume.  Musculoskeletal: Negative for back pain, arthralgias and gait problem.  Skin: Negative for rash.  Allergic/Immunologic: Negative for  environmental allergies.  Neurological: Negative for seizures, syncope, light-headedness and headaches.  Hematological: Negative for adenopathy.  Psychiatric/Behavioral: Negative for suicidal ideas, dysphoric mood and agitation. The patient is not nervous/anxious.     Per HPI unless specifically indicated above     Objective:    BP 100/70 mmHg  Pulse 95  Temp(Src) 97.5 F (36.4 C)  Ht 5\' 4"  (1.626 m)  Wt 164 lb 3.2 oz (74.481 kg)  BMI 28.17 kg/m2  SpO2 99%  Wt Readings from Last 3 Encounters:  09/06/15 164 lb 3.2 oz (74.481 kg)  06/27/15 168 lb 6.4 oz (76.386 kg)  06/07/15 162 lb 11.2 oz (73.8 kg)    Physical Exam  Constitutional: She is oriented to person, place, and time. She appears well-developed and well-nourished.  HENT:  Head: Normocephalic and atraumatic.  Neck: Neck supple.  Cardiovascular: Normal rate and regular rhythm.   Pulmonary/Chest: Effort normal and breath sounds normal. She exhibits tenderness.  Abdominal: Soft. Bowel sounds are normal. She exhibits no mass. There is no hepatosplenomegaly. There is no tenderness.  Musculoskeletal: She exhibits no edema.       Right shoulder: She exhibits normal range of motion and no tenderness.       Left shoulder: She exhibits normal range of motion and no tenderness.  Chest pain elicited with shoulder ROM testing  Lymphadenopathy:    She has no cervical adenopathy.  Neurological: She is alert and oriented to person, place, and time.  Skin: Skin is warm and dry.  Psychiatric: She has a normal mood and affect. Her behavior is normal.  Vitals reviewed.       Assessment & Plan:   Encounter Diagnoses  Name Primary?  . Gastroesophageal reflux disease, esophagitis presence not specified Yes  . Chest wall pain     -Continue omeprazole -Gave upper body exercises -F/u 6 months. RTO sooner prn worsening or new symptoms

## 2015-09-14 DIAGNOSIS — R0789 Other chest pain: Secondary | ICD-10-CM | POA: Insufficient documentation

## 2015-09-14 HISTORY — DX: Other chest pain: R07.89

## 2016-01-09 ENCOUNTER — Encounter: Payer: Self-pay | Admitting: Physician Assistant

## 2016-01-09 ENCOUNTER — Ambulatory Visit: Payer: Self-pay | Admitting: Physician Assistant

## 2016-01-09 VITALS — BP 104/72 | HR 87 | Temp 98.4°F | Ht 64.0 in | Wt 157.6 lb

## 2016-01-09 DIAGNOSIS — J019 Acute sinusitis, unspecified: Secondary | ICD-10-CM

## 2016-01-09 MED ORDER — AMOXICILLIN 500 MG PO CAPS: 500.0000 mg | ORAL_CAPSULE | Freq: Three times a day (TID) | ORAL | Status: DC

## 2016-01-09 NOTE — Patient Instructions (Signed)

## 2016-01-09 NOTE — Progress Notes (Signed)
BP 104/72 mmHg  Pulse 87  Temp(Src) 98.4 F (36.9 C)  Ht  (1.626 m)  Wt 157 lb 9.6 oz (71.487 kg)  BMI 27.04 kg/m2  SpO2 99%   Subjective:    Patient ID: Dana Lane, female    DOB: 1995-10-12, 20 y.o.   MRN: [id number]  HPI: Dana Lane is a 20 y.o. female presenting on 01/09/2016 for Headache; Sore Throat; and Eye Pain   HPI Chief Complaint  Patient presents with  . Headache    frontal and occipital, intermittent, since Thursday, tylenol and ibuprofen do not help  . Sore Throat    getting better with gargling salt water and using throat spray  . Eye Pain    pain around both eyes persists during the day, since Thursday    Pt was feeling well until last Thursday.   Pt states the worse things is the pain around her eyes.   She tried tylenol and ibu without much improvement.  Her ST is improving.  Her HA comes and goes.    Relevant past medical, surgical, family and social history reviewed and updated as indicated. Interim medical history since our last visit reviewed. Allergies and medications reviewed and updated.   Current outpatient prescriptions:  .  omeprazole (PRILOSEC) 40 MG capsule, Take 1 capsule (40 mg total) by mouth daily., Disp: 90 capsule, Rfl: 2   Review of Systems  Constitutional: Negative for fever, chills, diaphoresis, appetite change, fatigue and unexpected weight change.  HENT: Positive for sore throat. Negative for congestion, dental problem, drooling, ear pain, facial swelling, hearing loss, mouth sores, sneezing, trouble swallowing and voice change.   Eyes: Positive for pain. Negative for discharge, redness, itching and visual disturbance.  Respiratory: Negative for cough, choking, shortness of breath and wheezing.   Cardiovascular: Negative for chest pain, palpitations and leg swelling.  Gastrointestinal: Negative for vomiting, abdominal pain, diarrhea, constipation and blood in stool.  Endocrine: Negative for cold intolerance, heat  intolerance and polydipsia.  Genitourinary: Negative for dysuria, hematuria and decreased urine volume.  Musculoskeletal: Negative for back pain, arthralgias and gait problem.  Skin: Negative for rash.  Allergic/Immunologic: Negative for environmental allergies.  Neurological: Positive for headaches. Negative for seizures, syncope and light-headedness.  Hematological: Negative for adenopathy.  Psychiatric/Behavioral: Negative for suicidal ideas, dysphoric mood and agitation. The patient is not nervous/anxious.     Per HPI unless specifically indicated above     Objective:    BP 104/72 mmHg  Pulse 87  Temp(Src) 98.4 F (36.9 C)  Ht  (1.626 m)  Wt 157 lb 9.6 oz (71.487 kg)  BMI 27.04 kg/m2  SpO2 99%  Wt Readings from Last 3 Encounters:  01/09/16 157 lb 9.6 oz (71.487 kg)  09/06/15 164 lb 3.2 oz (74.481 kg)  06/27/15 168 lb 6.4 oz (76.386 kg)    Physical Exam  Constitutional: She is oriented to person, place, and time. She appears well-developed and well-nourished.  HENT:  Head: Normocephalic and atraumatic.  Right Ear: Hearing, tympanic membrane and external ear normal. A foreign body is present.  Left Ear: Hearing, tympanic membrane and external ear normal. A foreign body is present.  Nose: Right sinus exhibits frontal sinus tenderness. Left sinus exhibits frontal sinus tenderness.  Mouth/Throat: Uvula is midline and oropharynx is clear and moist. No oropharyngeal exudate.  Cerumen Bilaterally  Neck: Neck supple.  Cardiovascular: Normal rate and regular rhythm.   Pulmonary/Chest: Effort normal and breath sounds normal. She has no wheezes.  Abdominal:  Soft. Bowel sounds are normal. She exhibits no mass. There is no hepatosplenomegaly. There is no tenderness.  Musculoskeletal: She exhibits no edema.  Lymphadenopathy:    She has no cervical adenopathy.  Neurological: She is alert and oriented to person, place, and time.  Skin: Skin is warm and dry.  Face sunburned   Psychiatric: She has a normal mood and affect. Her behavior is normal.  Vitals reviewed.       Assessment & Plan:    Encounter Diagnosis  Name Primary?  . Acute sinusitis, recurrence not specified, unspecified location Yes   -rx amoxil.  Gave handout on sinusitis -counseled pt to wear sunscreen daily to prevent sun damage -f/u as scheduled.  RTO sooner prn

## 2016-01-29 ENCOUNTER — Ambulatory Visit: Payer: Self-pay | Admitting: Physician Assistant

## 2016-01-29 ENCOUNTER — Encounter: Payer: Self-pay | Admitting: Physician Assistant

## 2016-01-29 VITALS — BP 112/76 | HR 90 | Temp 97.9°F | Ht 64.0 in | Wt 155.5 lb

## 2016-01-29 DIAGNOSIS — N3090 Cystitis, unspecified without hematuria: Secondary | ICD-10-CM

## 2016-01-29 DIAGNOSIS — R319 Hematuria, unspecified: Secondary | ICD-10-CM

## 2016-01-29 LAB — POCT URINALYSIS DIPSTICK
Bilirubin, UA: NEGATIVE
Glucose, UA: NEGATIVE
Ketones, UA: NEGATIVE
Nitrite, UA: POSITIVE
Spec Grav, UA: 1.02
Urobilinogen, UA: 0.2
pH, UA: 6

## 2016-01-29 MED ORDER — CIPROFLOXACIN HCL 500 MG PO TABS: 500.0000 mg | ORAL_TABLET | Freq: Two times a day (BID) | ORAL | 0 refills | Status: DC

## 2016-01-29 NOTE — Progress Notes (Signed)
BP 112/76 (BP Location: Left Arm, Patient Position: Sitting, Cuff Size: Normal)   Pulse 90   Temp 97.9 F (36.6 C)   Ht 5\' 4"  (1.626 m)   Wt 155 lb 8 oz (70.5 kg)   SpO2 99%   BMI 26.69 kg/m    Subjective:    Patient ID: Dana Lane, female    DOB: 05/24/1996, 20 y.o.   MRN: [id number]  HPI: Dana Lane is a 20 y.o. female presenting on 01/29/2016 for Hematuria (intermittent since Thursday. pt c/o pain on lower abd. pt states she has a rash on upper legs. pt has taken AZO but was not helful)   HPI   Chief Complaint  Patient presents with  . Hematuria    intermittent since Thursday. pt c/o pain on lower abd. pt states she has a rash on upper legs. pt has taken AZO but was not helful    also states hurts in her stomach when she urinates.  LMP 01/08/16  Relevant past medical, surgical, family and social history reviewed and updated as indicated. Interim medical history since our last visit reviewed. Allergies and medications reviewed and updated.   Current Outpatient Prescriptions:  .  omeprazole (PRILOSEC) 40 MG capsule, Take 1 capsule (40 mg total) by mouth daily., Disp: 90 capsule, Rfl: 2  Review of Systems  Constitutional: Negative for appetite change, chills, diaphoresis, fatigue, fever and unexpected weight change.  HENT: Negative for congestion, drooling, ear pain, facial swelling, hearing loss, mouth sores, sneezing, sore throat, trouble swallowing and voice change.   Eyes: Negative for pain, discharge, redness, itching and visual disturbance.  Respiratory: Negative for cough, choking, shortness of breath and wheezing.   Cardiovascular: Negative for chest pain, palpitations and leg swelling.  Gastrointestinal: Positive for abdominal pain. Negative for blood in stool, constipation, diarrhea and vomiting.  Endocrine: Negative for cold intolerance, heat intolerance and polydipsia.  Genitourinary: Positive for hematuria. Negative for decreased urine volume and dysuria.   Musculoskeletal: Negative for arthralgias, back pain and gait problem.  Skin: Positive for rash.  Allergic/Immunologic: Negative for environmental allergies.  Neurological: Negative for seizures, syncope, light-headedness and headaches.  Hematological: Negative for adenopathy.  Psychiatric/Behavioral: Negative for agitation, dysphoric mood and suicidal ideas. The patient is not nervous/anxious.     Per HPI unless specifically indicated above     Objective:    BP 112/76 (BP Location: Left Arm, Patient Position: Sitting, Cuff Size: Normal)   Pulse 90   Temp 97.9 F (36.6 C)   Ht 5\' 4"  (1.626 m)   Wt 155 lb 8 oz (70.5 kg)   SpO2 99%   BMI 26.69 kg/m   Wt Readings from Last 3 Encounters:  01/29/16 155 lb 8 oz (70.5 kg)  01/09/16 157 lb 9.6 oz (71.5 kg)  09/06/15 164 lb 3.2 oz (74.5 kg)    Physical Exam  Constitutional: She is oriented to person, place, and time. She appears well-developed and well-nourished.  HENT:  Head: Normocephalic and atraumatic.  Neck: Neck supple.  Cardiovascular: Normal rate and regular rhythm.   Pulmonary/Chest: Effort normal and breath sounds normal.  Abdominal: Soft. Bowel sounds are normal. She exhibits no mass. There is no hepatosplenomegaly. There is no tenderness.  Musculoskeletal: She exhibits no edema.  Lymphadenopathy:    She has no cervical adenopathy.  Neurological: She is alert and oriented to person, place, and time.  Skin: Skin is warm and dry.  Psychiatric: She has a normal mood and affect. Her behavior is normal.  Vitals reviewed.   Results for orders placed or performed in visit on 06/27/15  POCT Urinalysis Dipstick  Result Value Ref Range   Color, UA yellow    Clarity, UA cloudy    Glucose, UA N    Bilirubin, UA N    Ketones, UA N    Spec Grav, UA 1.010    Blood, UA TRACE-LYSED    pH, UA 7.0    Protein, UA N    Urobilinogen, UA 1.0    Nitrite, UA N    Leukocytes, UA small (1+) (A) Negative      Assessment & Plan:    Encounter Diagnoses  Name Primary?  . Cystitis Yes  . Hematuria      -rx cipro -f/u as scheduled.  RTO sooner prn

## 2016-02-28 ENCOUNTER — Other Ambulatory Visit: Payer: Self-pay | Admitting: Physician Assistant

## 2016-03-06 ENCOUNTER — Ambulatory Visit: Payer: Self-pay | Admitting: Physician Assistant

## 2016-03-06 ENCOUNTER — Encounter: Payer: Self-pay | Admitting: Physician Assistant

## 2016-03-06 VITALS — BP 110/76 | HR 85 | Temp 97.9°F | Ht 64.0 in | Wt 160.2 lb

## 2016-03-06 DIAGNOSIS — K219 Gastro-esophageal reflux disease without esophagitis: Secondary | ICD-10-CM

## 2016-03-06 NOTE — Progress Notes (Signed)
   BP 110/76 (BP Location: Left Arm, Patient Position: Sitting, Cuff Size: Normal)   Pulse 85   Temp 97.9 F (36.6 C)   Ht 5\' 4"  (1.626 m)   Wt 160 lb 4 oz (72.7 kg)   SpO2 99%   BMI 27.51 kg/m    Subjective:    Patient ID: Dana Lane, female    DOB: 1995/08/02, 20 y.o.   MRN: [id number]  HPI: Dana Lane is a 20 y.o. female presenting on 03/06/2016 for Gastroesophageal Reflux   HPI   Pt is doing well.  She says her GERD is well controlled.  No problems today  Relevant past medical, surgical, family and social history reviewed and updated as indicated. Interim medical history since our last visit reviewed. Allergies and medications reviewed and updated.   Current Outpatient Prescriptions:  .  omeprazole (PRILOSEC) 20 MG capsule, TAKE 2 Capsules BY MOUTH ONCE DAILY, Disp: 180 capsule, Rfl: 2  Review of Systems  Constitutional: Negative for appetite change, chills, diaphoresis, fatigue, fever and unexpected weight change.  HENT: Negative for congestion, drooling, ear pain, facial swelling, hearing loss, mouth sores, sneezing, sore throat, trouble swallowing and voice change.   Eyes: Positive for itching. Negative for pain, discharge, redness and visual disturbance.  Respiratory: Negative for cough, choking, shortness of breath and wheezing.   Cardiovascular: Negative for chest pain, palpitations and leg swelling.  Gastrointestinal: Negative for abdominal pain, blood in stool, constipation, diarrhea and vomiting.  Endocrine: Negative for cold intolerance, heat intolerance and polydipsia.  Genitourinary: Negative for decreased urine volume, dysuria and hematuria.  Musculoskeletal: Negative for arthralgias, back pain and gait problem.  Skin: Negative for rash.  Allergic/Immunologic: Negative for environmental allergies.  Neurological: Negative for seizures, syncope, light-headedness and headaches.  Hematological: Negative for adenopathy.  Psychiatric/Behavioral: Negative for  agitation, dysphoric mood and suicidal ideas. The patient is nervous/anxious.     Per HPI unless specifically indicated above     Objective:    BP 110/76 (BP Location: Left Arm, Patient Position: Sitting, Cuff Size: Normal)   Pulse 85   Temp 97.9 F (36.6 C)   Ht 5\' 4"  (1.626 m)   Wt 160 lb 4 oz (72.7 kg)   SpO2 99%   BMI 27.51 kg/m   Wt Readings from Last 3 Encounters:  03/06/16 160 lb 4 oz (72.7 kg)  01/29/16 155 lb 8 oz (70.5 kg)  01/09/16 157 lb 9.6 oz (71.5 kg)    Physical Exam  Constitutional: She is oriented to person, place, and time. She appears well-developed and well-nourished.  HENT:  Head: Normocephalic and atraumatic.  Neck: Neck supple.  Cardiovascular: Normal rate and regular rhythm.   Pulmonary/Chest: Effort normal and breath sounds normal.  Abdominal: Soft. Bowel sounds are normal. She exhibits no mass. There is no hepatosplenomegaly. There is no tenderness.  Musculoskeletal: She exhibits no edema.  Lymphadenopathy:    She has no cervical adenopathy.  Neurological: She is alert and oriented to person, place, and time.  Skin: Skin is warm and dry.  Psychiatric: She has a normal mood and affect. Her behavior is normal.  Vitals reviewed.       Assessment & Plan:   Encounter Diagnosis  Name Primary?  . Gastroesophageal reflux disease, esophagitis presence not specified Yes    -pt can continue omeprazole as needed for GERD -f/u 6 months.  Will plan on PAP at that appointment.  RTO sooner prn

## 2016-07-15 ENCOUNTER — Encounter: Payer: Self-pay | Admitting: Physician Assistant

## 2016-07-15 ENCOUNTER — Ambulatory Visit: Payer: Self-pay | Admitting: Physician Assistant

## 2016-07-15 VITALS — BP 124/76 | HR 102 | Temp 98.1°F | Ht 64.0 in | Wt 164.5 lb

## 2016-07-15 DIAGNOSIS — G4700 Insomnia, unspecified: Secondary | ICD-10-CM

## 2016-07-15 DIAGNOSIS — R5383 Other fatigue: Secondary | ICD-10-CM

## 2016-07-15 DIAGNOSIS — Z13220 Encounter for screening for lipoid disorders: Secondary | ICD-10-CM

## 2016-07-15 DIAGNOSIS — I7300 Raynaud's syndrome without gangrene: Secondary | ICD-10-CM

## 2016-07-15 NOTE — Progress Notes (Signed)
BP 124/76 (BP Location: Left Arm, Patient Position: Sitting, Cuff Size: Normal)   Pulse (!) 102   Temp 98.1 F (36.7 C)   Ht 5\' 4"  (1.626 m)   Wt 164 lb 8 oz (74.6 kg)   SpO2 98%   BMI 28.24 kg/m    Subjective:    Patient ID: Dana Lane, female    DOB: 10/15/1995, 21 y.o.   MRN: [id number]  HPI: Dana Lane is a 21 y.o. female presenting on 07/15/2016 for Fatigue and Skin Discoloration (pt states her hands and feet turn purple)   HPI   She states feeling like no energy for about a week.  States she can't sleep.  She says she is Worried about wedding coming up in April.    States hand discoloration worse on days when it rains or snows.  Says she's been staying cold a lot lately  Pt never smoker.   Relevant past medical, surgical, family and social history reviewed and updated as indicated. Interim medical history since our last visit reviewed. Allergies and medications reviewed and updated.   Current Outpatient Prescriptions:  .  omeprazole (PRILOSEC) 20 MG capsule, TAKE 2 Capsules BY MOUTH ONCE DAILY, Disp: 180 capsule, Rfl: 2   Review of Systems  Constitutional: Positive for chills. Negative for appetite change, diaphoresis, fatigue, fever and unexpected weight change.  HENT: Negative for congestion, dental problem, drooling, ear pain, facial swelling, hearing loss, mouth sores, sneezing, sore throat, trouble swallowing and voice change.   Eyes: Negative for pain, discharge, redness, itching and visual disturbance.  Respiratory: Negative for cough, choking, shortness of breath and wheezing.   Cardiovascular: Negative for chest pain, palpitations and leg swelling.  Gastrointestinal: Negative for abdominal pain, blood in stool, constipation, diarrhea and vomiting.  Endocrine: Negative for cold intolerance, heat intolerance and polydipsia.  Genitourinary: Negative for decreased urine volume, dysuria and hematuria.  Musculoskeletal: Negative for arthralgias, back pain and  gait problem.  Skin: Negative for rash.  Allergic/Immunologic: Negative for environmental allergies.  Neurological: Positive for headaches. Negative for seizures, syncope and light-headedness.  Hematological: Negative for adenopathy.  Psychiatric/Behavioral: Positive for dysphoric mood. Negative for agitation and suicidal ideas. The patient is not nervous/anxious.     Per HPI unless specifically indicated above     Objective:    BP 124/76 (BP Location: Left Arm, Patient Position: Sitting, Cuff Size: Normal)   Pulse (!) 102   Temp 98.1 F (36.7 C)   Ht 5\' 4"  (1.626 m)   Wt 164 lb 8 oz (74.6 kg)   SpO2 98%   BMI 28.24 kg/m   Wt Readings from Last 3 Encounters:  07/15/16 164 lb 8 oz (74.6 kg)  03/06/16 160 lb 4 oz (72.7 kg)  01/29/16 155 lb 8 oz (70.5 kg)    Physical Exam  Constitutional: She is oriented to person, place, and time. She appears well-developed and well-nourished.  HENT:  Head: Normocephalic and atraumatic.  Neck: Neck supple.  Cardiovascular: Normal rate and regular rhythm.   Pulses:      Radial pulses are 2+ on the right side, and 2+ on the left side.       Dorsalis pedis pulses are 2+ on the right side, and 2+ on the left side.  Pulmonary/Chest: Effort normal and breath sounds normal.  Abdominal: Soft. Bowel sounds are normal. She exhibits no mass. There is no hepatosplenomegaly. There is no tenderness.  Musculoskeletal: She exhibits no edema.  Lymphadenopathy:    She has no cervical  adenopathy.  Neurological: She is alert and oriented to person, place, and time.  Skin: Skin is warm, dry and intact. No cyanosis. Nails show no clubbing.  Pt's face and neck skin turned bright red when she got embarrased when she was asked about was she excited about getting married.    Her distal fingers were mildly bluish.  Pt sat on hands for a few minutes and they became warmer and were pink.  Toes were cold but not notably bluish.  They did become more pink when warmed with  rubbing.  Psychiatric: She has a normal mood and affect. Her behavior is normal.  Vitals reviewed.       Assessment & Plan:   Encounter Diagnoses  Name Primary?  . Insomnia, unspecified type Yes  . Other fatigue   . Raynaud's disease without gangrene   . Screening cholesterol level     -Counseled pt on raynauds.  Discussed possible medication but decided to wait since symptoms not sever at this time -Check fasting labs tomorrow morning -counseled pt on insomnia.  Recommended getting plenty of exercise, relaxing, can try OTC insomnia medication if desired -Follow up as scheduled.  RTO sooner prn

## 2016-07-15 NOTE — Patient Instructions (Signed)
Insomnia Insomnia is a sleep disorder that makes it difficult to fall asleep or to stay asleep. Insomnia can cause tiredness (fatigue), low energy, difficulty concentrating, mood swings, and poor performance at work or school. There are three different ways to classify insomnia:  Difficulty falling asleep.  Difficulty staying asleep.  Waking up too early in the morning. Any type of insomnia can be long-term (chronic) or short-term (acute). Both are common. Short-term insomnia usually lasts for three months or less. Chronic insomnia occurs at least three times a week for longer than three months. What are the causes? Insomnia may be caused by another condition, situation, or substance, such as:  Anxiety.  Certain medicines.  Gastroesophageal reflux disease (GERD) or other gastrointestinal conditions.  Asthma or other breathing conditions.  Restless legs syndrome, sleep apnea, or other sleep disorders.  Chronic pain.  Menopause. This may include hot flashes.  Stroke.  Abuse of alcohol, tobacco, or illegal drugs.  Depression.  Caffeine.  Neurological disorders, such as Alzheimer disease.  An overactive thyroid (hyperthyroidism). The cause of insomnia may not be known. What increases the risk? Risk factors for insomnia include:  Gender. Women are more commonly affected than men.  Age. Insomnia is more common as you get older.  Stress. This may involve your professional or personal life.  Income. Insomnia is more common in people with lower income.  Lack of exercise.  Irregular work schedule or night shifts.  Traveling between different time zones. What are the signs or symptoms? If you have insomnia, trouble falling asleep or trouble staying asleep is the main symptom. This may lead to other symptoms, such as:  Feeling fatigued.  Feeling nervous about going to sleep.  Not feeling rested in the morning.  Having trouble concentrating.  Feeling irritable,  anxious, or depressed. How is this treated? Treatment for insomnia depends on the cause. If your insomnia is caused by an underlying condition, treatment will focus on addressing the condition. Treatment may also include:  Medicines to help you sleep.  Counseling or therapy.  Lifestyle adjustments. Follow these instructions at home:  Take medicines only as directed by your health care provider.  Keep regular sleeping and waking hours. Avoid naps.  Keep a sleep diary to help you and your health care provider figure out what could be causing your insomnia. Include:  When you sleep.  When you wake up during the night.  How well you sleep.  How rested you feel the next day.  Any side effects of medicines you are taking.  What you eat and drink.  Make your bedroom a comfortable place where it is easy to fall asleep:  Put up shades or special blackout curtains to block light from outside.  Use a white noise machine to block noise.  Keep the temperature cool.  Exercise regularly as directed by your health care provider. Avoid exercising right before bedtime.  Use relaxation techniques to manage stress. Ask your health care provider to suggest some techniques that may work well for you. These may include:  Breathing exercises.  Routines to release muscle tension.  Visualizing peaceful scenes.  Cut back on alcohol, caffeinated beverages, and cigarettes, especially close to bedtime. These can disrupt your sleep.  Do not overeat or eat spicy foods right before bedtime. This can lead to digestive discomfort that can make it hard for you to sleep.  Limit screen use before bedtime. This includes:  Watching TV.  Using your smartphone, tablet, and computer.  Stick to a   routine. This can help you fall asleep faster. Try to do a quiet activity, brush your teeth, and go to bed at the same time each night.  Get out of bed if you are still awake after 15 minutes of trying to  sleep. Keep the lights down, but try reading or doing a quiet activity. When you feel sleepy, go back to bed.  Make sure that you drive carefully. Avoid driving if you feel very sleepy.  Keep all follow-up appointments as directed by your health care provider. This is important. Contact a health care provider if:  You are tired throughout the day or have trouble in your daily routine due to sleepiness.  You continue to have sleep problems or your sleep problems get worse. Get help right away if:  You have serious thoughts about hurting yourself or someone else. This information is not intended to replace advice given to you by your health care provider. Make sure you discuss any questions you have with your health care provider. Document Released: 06/06/2000 Document Revised: 11/09/2015 Document Reviewed: 03/10/2014 Elsevier Interactive Patient Education  2017 [employer]  

## 2016-07-16 LAB — COMPREHENSIVE METABOLIC PANEL
ALT: 21 U/L (ref 6–29)
AST: 16 U/L (ref 10–30)
Albumin: 3.8 g/dL (ref 3.6–5.1)
Alkaline Phosphatase: 53 U/L (ref 33–115)
BUN: 7 mg/dL (ref 7–25)
CO2: 28 mmol/L (ref 20–31)
Calcium: 9.1 mg/dL (ref 8.6–10.2)
Chloride: 102 mmol/L (ref 98–110)
Creat: 0.75 mg/dL (ref 0.50–1.10)
Glucose, Bld: 82 mg/dL (ref 65–99)
Potassium: 4.4 mmol/L (ref 3.5–5.3)
Sodium: 138 mmol/L (ref 135–146)
Total Bilirubin: 0.4 mg/dL (ref 0.2–1.2)
Total Protein: 6.4 g/dL (ref 6.1–8.1)

## 2016-07-16 LAB — CBC WITH DIFFERENTIAL/PLATELET
Basophils Absolute: 0 cells/uL (ref 0–200)
Basophils Relative: 0 %
Eosinophils Absolute: 140 cells/uL (ref 15–500)
Eosinophils Relative: 2 %
HCT: 38.8 % (ref 35.0–45.0)
Hemoglobin: 12.8 g/dL (ref 11.7–15.5)
Lymphocytes Relative: 33 %
Lymphs Abs: 2310 cells/uL (ref 850–3900)
MCH: 29.6 pg (ref 27.0–33.0)
MCHC: 33 g/dL (ref 32.0–36.0)
MCV: 89.8 fL (ref 80.0–100.0)
MPV: 10.4 fL (ref 7.5–12.5)
Monocytes Absolute: 280 cells/uL (ref 200–950)
Monocytes Relative: 4 %
Neutro Abs: 4270 cells/uL (ref 1500–7800)
Neutrophils Relative %: 61 %
Platelets: 278 10*3/uL (ref 140–400)
RBC: 4.32 MIL/uL (ref 3.80–5.10)
RDW: 14 % (ref 11.0–15.0)
WBC: 7 10*3/uL (ref 3.8–10.8)

## 2016-07-16 LAB — LIPID PANEL
Cholesterol: 176 mg/dL (ref <200)
HDL: 49 mg/dL — ABNORMAL LOW (ref >50)
LDL Cholesterol: 91 mg/dL (ref <100)
Total CHOL/HDL Ratio: 3.6 Ratio (ref <5.0)
Triglycerides: 181 mg/dL — ABNORMAL HIGH (ref <150)
VLDL: 36 mg/dL — ABNORMAL HIGH (ref <30)

## 2016-07-17 LAB — ANA: Anti Nuclear Antibody(ANA): NEGATIVE

## 2016-07-17 LAB — SEDIMENTATION RATE: Sed Rate: 4 mm/hr (ref 0–20)

## 2016-09-08 ENCOUNTER — Encounter: Payer: Self-pay | Admitting: Physician Assistant

## 2016-09-08 ENCOUNTER — Other Ambulatory Visit: Payer: Self-pay | Admitting: Physician Assistant

## 2016-09-08 ENCOUNTER — Ambulatory Visit: Payer: Self-pay | Admitting: Physician Assistant

## 2016-09-08 VITALS — BP 120/60 | HR 85 | Temp 97.9°F | Ht 64.0 in | Wt 169.5 lb

## 2016-09-08 DIAGNOSIS — Z124 Encounter for screening for malignant neoplasm of cervix: Secondary | ICD-10-CM

## 2016-09-08 DIAGNOSIS — F4321 Adjustment disorder with depressed mood: Secondary | ICD-10-CM

## 2016-09-08 DIAGNOSIS — K219 Gastro-esophageal reflux disease without esophagitis: Secondary | ICD-10-CM

## 2016-09-08 MED ORDER — TRIAMCINOLONE ACETONIDE 0.5 % EX CREA: 1.0000 "application " | TOPICAL_CREAM | Freq: Two times a day (BID) | CUTANEOUS | 0 refills | Status: DC | PRN
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
## 2016-09-09 LAB — PAP, THINPREP RFLX HPV

## 2016-10-21 ENCOUNTER — Ambulatory Visit: Payer: Self-pay | Admitting: Physician Assistant

## 2016-10-21 ENCOUNTER — Encounter: Payer: Self-pay | Admitting: Physician Assistant

## 2016-10-21 VITALS — BP 108/80 | HR 100 | Temp 98.1°F | Ht 64.0 in | Wt 162.8 lb

## 2016-10-21 DIAGNOSIS — J302 Other seasonal allergic rhinitis: Secondary | ICD-10-CM

## 2016-10-21 DIAGNOSIS — J069 Acute upper respiratory infection, unspecified: Secondary | ICD-10-CM

## 2016-10-21 NOTE — Progress Notes (Signed)
BP 108/80 (BP Location: Left Arm, Patient Position: Sitting, Cuff Size: Normal)   Pulse 100   Temp 98.1 F (36.7 C)   Ht  (1.626 m)   Wt 162 lb 12 oz (73.8 kg)   SpO2 99%   BMI 27.94 kg/m    Subjective:    Patient ID: Dana Lane, female    DOB: 1996-05-03, 21 y.o.   MRN: [id number]  HPI: Dana Lane is a 21 y.o. female presenting on 10/21/2016 for Sore Throat   HPI Pt states ST started Sunday.  Pain constant.  Also with lots of nasal congestion.  Sometimes mucus comes out her mought.  Also EA and HA.  Some cough.  No fever.   Relevant past medical, surgical, family and social history reviewed and updated as indicated. Interim medical history since our last visit reviewed. Allergies and medications reviewed and updated.   Current Outpatient Prescriptions:  .  omeprazole (PRILOSEC) 20 MG capsule, TAKE 2 Capsules BY MOUTH ONCE DAILY, Disp: 180 capsule, Rfl: 2   Review of Systems  Constitutional: Negative for appetite change, chills, diaphoresis, fatigue, fever and unexpected weight change.  HENT: Positive for congestion, ear pain, sneezing and sore throat. Negative for dental problem, drooling, facial swelling, hearing loss, mouth sores, trouble swallowing and voice change.   Eyes: Positive for pain. Negative for discharge, redness, itching and visual disturbance.  Respiratory: Positive for cough. Negative for choking, shortness of breath and wheezing.   Cardiovascular: Negative for chest pain, palpitations and leg swelling.  Gastrointestinal: Negative for abdominal pain, blood in stool, constipation, diarrhea and vomiting.  Endocrine: Negative for cold intolerance, heat intolerance and polydipsia.  Genitourinary: Negative for decreased urine volume, dysuria and hematuria.  Musculoskeletal: Negative for arthralgias, back pain and gait problem.  Skin: Negative for rash.  Allergic/Immunologic: Negative for environmental allergies.  Neurological: Positive for headaches.  Negative for seizures, syncope and light-headedness.  Hematological: Positive for adenopathy.  Psychiatric/Behavioral: Negative for agitation, dysphoric mood and suicidal ideas. The patient is nervous/anxious.     Per HPI unless specifically indicated above     Objective:    BP 108/80 (BP Location: Left Arm, Patient Position: Sitting, Cuff Size: Normal)   Pulse 100   Temp 98.1 F (36.7 C)   Ht  (1.626 m)   Wt 162 lb 12 oz (73.8 kg)   SpO2 99%   BMI 27.94 kg/m   Wt Readings from Last 3 Encounters:  10/21/16 162 lb 12 oz (73.8 kg)  09/08/16 169 lb 8 oz (76.9 kg)  07/15/16 164 lb 8 oz (74.6 kg)    Physical Exam  Constitutional: She is oriented to person, place, and time. She appears well-developed and well-nourished.  HENT:  Head: Normocephalic and atraumatic.  Right Ear: Hearing, tympanic membrane, external ear and ear canal normal.  Left Ear: Hearing, tympanic membrane, external ear and ear canal normal.  Nose: Rhinorrhea present.  Mouth/Throat: Uvula is midline and oropharynx is clear and moist. No oropharyngeal exudate.  Neck: Neck supple.  Cardiovascular: Normal rate and regular rhythm.   Pulmonary/Chest: Effort normal and breath sounds normal. She has no wheezes.  Lymphadenopathy:    She has no cervical adenopathy.  Neurological: She is alert and oriented to person, place, and time.  Skin: Skin is warm and dry.  Psychiatric: She has a normal mood and affect. Her behavior is normal.  Vitals reviewed.      Assessment & Plan:   Encounter Diagnoses  Name Primary?  . Acute  upper respiratory infection Yes  . Seasonal allergic rhinitis, unspecified trigger    Pt counseled to :  Warm salt water gargles Use tylenol or Ibuprofen as needed for pain Use mucinex for next week and then as needed.   Use over-the-counter antihistamine like claritan or benedryl Get extra rest Drink extra fluids Use extra pillow at night for next week or so Follow up as scheduled.   RTO sooner prn

## 2016-10-21 NOTE — Patient Instructions (Signed)
Warm salt water gargles Use tylenol or Ibuprofen as needed for pain Use mucinex for next week and then as needed.   Use over-the-counter antihistamine like claritan or benedryl Get extra rest Drink extra fluids Use extra pillow at night for next week or so    Upper Respiratory Infection, Adult Most upper respiratory infections (URIs) are a viral infection of the air passages leading to the lungs. A URI affects the nose, throat, and upper air passages. The most common type of URI is nasopharyngitis and is typically referred to as "the common cold." URIs run their course and usually go away on their own. Most of the time, a URI does not require medical attention, but sometimes a bacterial infection in the upper airways can follow a viral infection. This is called a secondary infection. Sinus and middle ear infections are common types of secondary upper respiratory infections. Bacterial pneumonia can also complicate a URI. A URI can worsen asthma and chronic obstructive pulmonary disease (COPD). Sometimes, these complications can require emergency medical care and may be life threatening. What are the causes? Almost all URIs are caused by viruses. A virus is a type of germ and can spread from one person to another. What increases the risk? You may be at risk for a URI if:  You smoke.  You have chronic heart or lung disease.  You have a weakened defense (immune) system.  You are very young or very old.  You have nasal allergies or asthma.  You work in crowded or poorly ventilated areas.  You work in health care facilities or schools. What are the signs or symptoms? Symptoms typically develop 2-3 days after you come in contact with a cold virus. Most viral URIs last 7-10 days. However, viral URIs from the influenza virus (flu virus) can last 14-18 days and are typically more severe. Symptoms may include:  Runny or stuffy (congested) nose.  Sneezing.  Cough.  Sore  throat.  Headache.  Fatigue.  Fever.  Loss of appetite.  Pain in your forehead, behind your eyes, and over your cheekbones (sinus pain).  Muscle aches. How is this diagnosed? Your health care provider may diagnose a URI by:  Physical exam.  Tests to check that your symptoms are not due to another condition such as:  Strep throat.  Sinusitis.  Pneumonia.  Asthma. How is this treated? A URI goes away on its own with time. It cannot be cured with medicines, but medicines may be prescribed or recommended to relieve symptoms. Medicines may help:  Reduce your fever.  Reduce your cough.  Relieve nasal congestion. Follow these instructions at home:  Take medicines only as directed by your health care provider.  Gargle warm saltwater or take cough drops to comfort your throat as directed by your health care provider.  Use a warm mist humidifier or inhale steam from a shower to increase air moisture. This may make it easier to breathe.  Drink enough fluid to keep your urine clear or pale yellow.  Eat soups and other clear broths and maintain good nutrition.  Rest as needed.  Return to work when your temperature has returned to normal or as your health care provider advises. You may need to stay home longer to avoid infecting others. You can also use a face mask and careful hand washing to prevent spread of the virus.  Increase the usage of your inhaler if you have asthma.  Do not use any tobacco products, including cigarettes, chewing tobacco, or  electronic cigarettes. If you need help quitting, ask your health care provider. How is this prevented? The best way to protect yourself from getting a cold is to practice good hygiene.  Avoid oral or hand contact with people with cold symptoms.  Wash your hands often if contact occurs. There is no clear evidence that vitamin C, vitamin E, echinacea, or exercise reduces the chance of developing a cold. However, it is always  recommended to get plenty of rest, exercise, and practice good nutrition. Contact a health care provider if:  You are getting worse rather than better.  Your symptoms are not controlled by medicine.  You have chills.  You have worsening shortness of breath.  You have brown or red mucus.  You have yellow or brown nasal discharge.  You have pain in your face, especially when you bend forward.  You have a fever.  You have swollen neck glands.  You have pain while swallowing.  You have white areas in the back of your throat. Get help right away if:  You have severe or persistent:  Headache.  Ear pain.  Sinus pain.  Chest pain.  You have chronic lung disease and any of the following:  Wheezing.  Prolonged cough.  Coughing up blood.  A change in your usual mucus.  You have a stiff neck.  You have changes in your:  Vision.  Hearing.  Thinking.  Mood. This information is not intended to replace advice given to you by your health care provider. Make sure you discuss any questions you have with your health care provider. Document Released: 12/03/2000 Document Revised: 02/10/2016 Document Reviewed: 09/14/2013 Elsevier Interactive Patient Education  2017 [employer].

## 2016-12-03 ENCOUNTER — Other Ambulatory Visit: Payer: Self-pay | Admitting: Physician Assistant

## 2017-08-24 ENCOUNTER — Ambulatory Visit: Payer: Self-pay | Admitting: Physician Assistant

## 2017-10-04 DIAGNOSIS — J039 Acute tonsillitis, unspecified: Secondary | ICD-10-CM | POA: Diagnosis not present

## 2017-10-04 DIAGNOSIS — Z79899 Other long term (current) drug therapy: Secondary | ICD-10-CM | POA: Diagnosis not present

## 2017-10-04 DIAGNOSIS — K219 Gastro-esophageal reflux disease without esophagitis: Secondary | ICD-10-CM | POA: Diagnosis not present

## 2017-11-28 DIAGNOSIS — J0300 Acute streptococcal tonsillitis, unspecified: Secondary | ICD-10-CM | POA: Diagnosis not present

## 2017-11-28 DIAGNOSIS — J029 Acute pharyngitis, unspecified: Secondary | ICD-10-CM | POA: Diagnosis not present

## 2017-12-11 ENCOUNTER — Ambulatory Visit: Payer: Commercial Managed Care - PPO | Admitting: Family

## 2017-12-11 ENCOUNTER — Encounter: Payer: Self-pay | Admitting: Family

## 2017-12-11 VITALS — BP 123/86 | HR 87 | Temp 98.6°F | Ht 64.0 in | Wt 184.8 lb

## 2017-12-11 DIAGNOSIS — F321 Major depressive disorder, single episode, moderate: Secondary | ICD-10-CM

## 2017-12-11 DIAGNOSIS — E669 Obesity, unspecified: Secondary | ICD-10-CM | POA: Insufficient documentation

## 2017-12-11 DIAGNOSIS — N3001 Acute cystitis with hematuria: Secondary | ICD-10-CM

## 2017-12-11 DIAGNOSIS — F329 Major depressive disorder, single episode, unspecified: Secondary | ICD-10-CM | POA: Insufficient documentation

## 2017-12-11 DIAGNOSIS — F32A Depression, unspecified: Secondary | ICD-10-CM | POA: Insufficient documentation

## 2017-12-11 DIAGNOSIS — R300 Dysuria: Secondary | ICD-10-CM | POA: Diagnosis not present

## 2017-12-11 LAB — URINALYSIS, COMPLETE
Bilirubin, UA: NEGATIVE
Glucose, UA: NEGATIVE
Ketones, UA: NEGATIVE
Nitrite, UA: NEGATIVE
Specific Gravity, UA: 1.02 (ref 1.005–1.030)
Urobilinogen, Ur: 1 mg/dL (ref 0.2–1.0)
pH, UA: 7 (ref 5.0–7.5)

## 2017-12-11 LAB — MICROSCOPIC EXAMINATION
RBC, UA: >30 /hpf — AB (ref 0–2)
Renal Epithel, UA: NONE SEEN /hpf
WBC, UA: >30 /hpf — AB (ref 0–5)

## 2017-12-11 MED ORDER — SULFAMETHOXAZOLE-TRIMETHOPRIM 800-160 MG PO TABS: 1.0000 | ORAL_TABLET | Freq: Two times a day (BID) | ORAL | 0 refills | Status: DC

## 2017-12-11 NOTE — Patient Instructions (Signed)

## 2017-12-11 NOTE — Progress Notes (Signed)
Subjective:    Patient ID: Dana Lane, female    DOB: [birth year], 22 y.o.   MRN: [id number]  PT presents to the office today to establish care and complaints of dsyuria. PT complaining of depression, but is followed by [facility].  Dysuria   This is a new problem. The current episode started in the past 7 days. The problem occurs every urination. The problem has been gradually worsening. The quality of the pain is described as burning. The pain is at a severity of 5/10. The pain is moderate. There has been no fever. Associated symptoms include flank pain, frequency, hesitancy and urgency. Pertinent negatives include no discharge or nausea. She has tried increased fluids (AZO) for the symptoms. The treatment provided no relief.  Depression         The current episode started more than 1 year ago.   The onset quality is gradual.   The problem occurs constantly.  The problem has been waxing and waning since onset.  Associated symptoms include helplessness, hopelessness, irritable, decreased interest and sad.  Past treatments include nothing.     Review of Systems  Gastrointestinal: Negative for nausea.  Genitourinary: Positive for dysuria, flank pain, frequency, hesitancy and urgency.  Psychiatric/Behavioral: Positive for depression.  All other systems reviewed and are negative.   Social History   Socioeconomic History  . Marital status: Single    Spouse name: Not on file  . Number of children: Not on file  . Years of education: Not on file  . Highest education level: Not on file  Occupational History  . Not on file  Social Needs  . Financial resource strain: Not on file  . Food insecurity:    Worry: Not on file    Inability: Not on file  . Transportation needs:    Medical: Not on file    Non-medical: Not on file  Tobacco Use  . Smoking status: Never Smoker  . Smokeless tobacco: Never Used  Substance and Sexual Activity  . Alcohol use: No  . Drug use: No  . Sexual activity:  Not on file  Lifestyle  . Physical activity:    Days per week: Not on file    Minutes per session: Not on file  . Stress: Not on file  Relationships  . Social connections:    Talks on phone: Not on file    Gets together: Not on file    Attends religious service: Not on file    Active member of club or organization: Not on file    Attends meetings of clubs or organizations: Not on file    Relationship status: Not on file  . Intimate partner violence:    Fear of current or ex partner: Not on file    Emotionally abused: Not on file    Physically abused: Not on file    Forced sexual activity: Not on file  Other Topics Concern  . Not on file  Social History Narrative  . Not on file    Breast Cancer-relatedfamily history is not on file.     Objective:   Physical Exam  Constitutional: She is oriented to person, place, and time. She appears well-developed and well-nourished. She is irritable. No distress.  HENT:  Head: Normocephalic and atraumatic.  Right Ear: External ear normal.  Mouth/Throat: Oropharynx is clear and moist.  Eyes: Pupils are equal, round, and reactive to light.  Neck: Normal range of motion. Neck supple. No thyromegaly present.  Cardiovascular: Normal rate, regular  rhythm, normal heart sounds and intact distal pulses.  No murmur heard. Pulmonary/Chest: Effort normal and breath sounds normal. No respiratory distress. She has no wheezes.  Abdominal: Soft. Bowel sounds are normal. She exhibits no distension. There is no tenderness.  Musculoskeletal: Normal range of motion. She exhibits no edema or tenderness.  Neurological: She is alert and oriented to person, place, and time. She has normal reflexes. No cranial nerve deficit.  Skin: Skin is warm and dry.  Psychiatric: She has a normal mood and affect. Her behavior is normal. Judgment and thought content normal.  Vitals reviewed.     BP 123/86   Pulse 87   Temp 98.6 F (37 C) (Oral)   Ht 5\' 4"  (1.626 m)    Wt 184 lb 12.8 oz (83.8 kg)   BMI 31.72 kg/m      Assessment & Plan:  Dana Lane comes in today with chief complaint of New Patient (Initial Visit) and Dysuria   Diagnosis and orders addressed:  1. Dysuria - Urinalysis, Complete  2. Acute cystitis with hematuria Force fluids AZO over the counter X2 days RTO prn Culture pending - Urine Culture - sulfamethoxazole-trimethoprim (BACTRIM DS) 800-160 MG tablet; Take 1 tablet by mouth 2 (two) times daily.  Dispense: 14 tablet; Refill: 0  3. Current moderate episode of major depressive disorder, unspecified whether recurrent (HCC) Will hold off on adding medication since patient is followed by [facility]  4. Obesity (BMI 30-39.9)    Jannifer Rodney, FNP

## 2017-12-13 LAB — URINE CULTURE

## 2017-12-25 ENCOUNTER — Encounter: Payer: Self-pay | Admitting: Family

## 2017-12-25 ENCOUNTER — Ambulatory Visit: Payer: Commercial Managed Care - PPO | Admitting: Family

## 2017-12-25 VITALS — BP 113/75 | HR 87 | Temp 98.1°F | Ht 64.0 in | Wt 186.0 lb

## 2017-12-25 DIAGNOSIS — J069 Acute upper respiratory infection, unspecified: Secondary | ICD-10-CM

## 2017-12-25 DIAGNOSIS — H6122 Impacted cerumen, left ear: Secondary | ICD-10-CM

## 2017-12-25 DIAGNOSIS — J029 Acute pharyngitis, unspecified: Secondary | ICD-10-CM | POA: Diagnosis not present

## 2017-12-25 DIAGNOSIS — F321 Major depressive disorder, single episode, moderate: Secondary | ICD-10-CM | POA: Diagnosis not present

## 2017-12-25 LAB — RAPID STREP SCREEN (MED CTR MEBANE ONLY): Strep Gp A Ag, IA W/Reflex: NEGATIVE

## 2017-12-25 LAB — CULTURE, GROUP A STREP

## 2017-12-25 MED ORDER — ESCITALOPRAM OXALATE 10 MG PO TABS: 10.0000 mg | ORAL_TABLET | Freq: Every day | ORAL | 3 refills | Status: DC

## 2017-12-25 MED ORDER — FLUTICASONE PROPIONATE 50 MCG/ACT NA SUSP: 2.0000 | Freq: Every day | NASAL | 6 refills | Status: DC

## 2017-12-25 NOTE — Progress Notes (Signed)
Subjective:    Patient ID: Dana Lane, female    DOB: Sep 18, 1995, 22 y.o.   MRN: [id number]  Chief Complaint  Patient presents with  . Sore Throat    started yesterday  . left ear pain    Sore Throat   This is a new problem. The current episode started yesterday. The problem has been rapidly worsening. There has been no fever. The pain is at a severity of 8/10. The pain is mild. Associated symptoms include ear pain, a hoarse voice, a plugged ear sensation (left) and trouble swallowing. Pertinent negatives include no congestion, coughing, ear discharge, headaches or shortness of breath. She has tried acetaminophen and NSAIDs for the symptoms. The treatment provided mild relief.  Depression         This is a chronic problem.  The current episode started more than 1 year ago.   The onset quality is gradual.   The problem occurs constantly.  The problem has been waxing and waning since onset.  Associated symptoms include helplessness, hopelessness, irritable, decreased interest, sad and suicidal ideas (not at this time).  Associated symptoms include no headaches.     Review of Systems  HENT: Positive for ear pain, hoarse voice and trouble swallowing. Negative for congestion and ear discharge.   Respiratory: Negative for cough and shortness of breath.   Neurological: Negative for headaches.  Psychiatric/Behavioral: Positive for depression and suicidal ideas (not at this time).  All other systems reviewed and are negative.      Objective:   Physical Exam  Constitutional: She is oriented to person, place, and time. She appears well-developed and well-nourished. She is irritable. No distress.  HENT:  Head: Normocephalic and atraumatic.  Right Ear: External ear normal.  Mouth/Throat: Posterior oropharyngeal erythema present. Tonsils are 2+ on the right. Tonsils are 2+ on the left.  Left ear cerumen impaction   Eyes: Pupils are equal, round, and reactive to light.  Neck: Normal range of  motion. Neck supple. No thyromegaly present.  Cardiovascular: Normal rate, regular rhythm, normal heart sounds and intact distal pulses.  No murmur heard. Pulmonary/Chest: Effort normal and breath sounds normal. No respiratory distress. She has no wheezes.  Abdominal: Soft. Bowel sounds are normal. She exhibits no distension. There is no tenderness.  Musculoskeletal: Normal range of motion. She exhibits no edema or tenderness.  Neurological: She is alert and oriented to person, place, and time. She has normal reflexes. No cranial nerve deficit.  Skin: Skin is warm and dry.  Psychiatric: She has a normal mood and affect. Her behavior is normal. Judgment and thought content normal.  Vitals reviewed.  Bilateral ears washed with warm water and peroxide. TM WNL.  BP 113/75 (BP Location: Right Arm)   Pulse 87   Temp 98.1 F (36.7 C) (Oral)   Ht 5\' 4"  (1.626 m)   Wt 186 lb (84.4 kg)   LMP 11/27/2017   BMI 31.93 kg/m      Assessment & Plan:  Dana Lane comes in today with chief complaint of Sore Throat (started yesterday) and left ear pain   Diagnosis and orders addressed:  1. Sore throat - Rapid Strep Screen ([facility] ONLY)  2. Current moderate episode of major depressive disorder, unspecified whether recurrent [facility](HCC) We discussed on her last visit [facility] get [facility] appointment instead of October. She states she did call, but did not get a call back.  I am placing referral today because I  am not sure she will follow up and make appointment on her on Will start Lexapro 10 mg today Stress  Management discussed RTO in 6 weeks or follow up with behavorial health if she has appt - Ambulatory referral to Psychiatry - escitalopram (LEXAPRO) 10 MG tablet; Take 1 tablet (10 mg total) by mouth daily.  Dispense: 90 tablet; Refill: 3  3. Viral upper respiratory tract infection - Take meds as prescribed - Use a cool mist humidifier  -Use saline nose sprays  frequently -Force fluids -For any cough or congestion  Use plain Mucinex- regular strength or max strength is fine -For fever or aces or pains- take tylenol or ibuprofen. -Throat lozenges if help - fluticasone (FLONASE) 50 MCG/ACT nasal spray; Place 2 sprays into both nostrils daily.  Dispense: 16 g; Refill: 6  4. Impacted cerumen of left ear OTC drops as needed    Jannifer Rodney, FNP

## 2017-12-25 NOTE — Patient Instructions (Signed)
Upper Respiratory Infection, Adult Most upper respiratory infections (URIs) are caused by a virus. A URI affects the nose, throat, and upper air passages. The most common type of URI is often called "the common cold." Follow these instructions at home:  Take medicines only as told by your doctor.  Gargle warm saltwater or take cough drops to comfort your throat as told by your doctor.  Use a warm mist humidifier or inhale steam from a shower to increase air moisture. This may make it easier to breathe.  Drink enough fluid to keep your pee (urine) clear or pale yellow.  Eat soups and other clear broths.  Have a healthy diet.  Rest as needed.  Go back to work when your fever is gone or your doctor says it is okay. ? You may need to stay home longer to avoid giving your URI to others. ? You can also wear a face mask and wash your hands often to prevent spread of the virus.  Use your inhaler more if you have asthma.  Do not use any tobacco products, including cigarettes, chewing tobacco, or electronic cigarettes. If you need help quitting, ask your doctor. Contact a doctor if:  You are getting worse, not better.  Your symptoms are not helped by medicine.  You have chills.  You are getting more short of breath.  You have brown or red mucus.  You have yellow or brown discharge from your nose.  You have pain in your face, especially when you bend forward.  You have a fever.  You have puffy (swollen) neck glands.  You have pain while swallowing.  You have white areas in the back of your throat. Get help right away if:  You have very bad or constant: ? Headache. ? Ear pain. ? Pain in your forehead, behind your eyes, and over your cheekbones (sinus pain). ? Chest pain.  You have long-lasting (chronic) lung disease and any of the following: ? Wheezing. ? Long-lasting cough. ? Coughing up blood. ? A change in your usual mucus.  You have a stiff neck.  You have  changes in your: ? Vision. ? Hearing. ? Thinking. ? Mood. This information is not intended to replace advice given to you by your health care provider. Make sure you discuss any questions you have with your health care provider. Document Released: 11/26/2007 Document Revised: 02/10/2016 Document Reviewed: 09/14/2013 Elsevier Interactive Patient Education  2018 [employer]  

## 2017-12-30 ENCOUNTER — Encounter: Payer: Self-pay | Admitting: Family

## 2017-12-30 ENCOUNTER — Ambulatory Visit: Payer: Commercial Managed Care - PPO | Admitting: Family

## 2017-12-30 VITALS — BP 113/78 | HR 83 | Temp 98.7°F | Ht 64.0 in | Wt 186.6 lb

## 2017-12-30 DIAGNOSIS — J029 Acute pharyngitis, unspecified: Secondary | ICD-10-CM

## 2017-12-30 MED ORDER — AMOXICILLIN-POT CLAVULANATE 875-125 MG PO TABS
1.0000 | ORAL_TABLET | Freq: Two times a day (BID) | ORAL | 0 refills | Status: DC
Start: 2017-12-30 — End: 2018-02-16

## 2017-12-30 NOTE — Progress Notes (Signed)
   Subjective:    Patient ID: Dana Lane, female    DOB: [birth year], 22 y.o.   MRN: [id number]  Chief Complaint  Patient presents with  . swollen tonsils    Sore Throat   This is a new problem. The current episode started in the past 7 days. The problem has been gradually worsening. There has been no fever. The pain is at a severity of 8/10. The pain is moderate. Associated symptoms include ear pain, a hoarse voice and trouble swallowing. Pertinent negatives include no congestion, coughing or headaches. She has tried acetaminophen and NSAIDs for the symptoms. The treatment provided mild relief.      Review of Systems  HENT: Positive for ear pain, hoarse voice and trouble swallowing. Negative for congestion.   Respiratory: Negative for cough.   Neurological: Negative for headaches.  All other systems reviewed and are negative.      Objective:   Physical Exam  Constitutional: She is oriented to person, place, and time. She appears well-developed and well-nourished. No distress.  HENT:  Head: Normocephalic and atraumatic.  Right Ear: External ear normal.  Nose: Mucosal edema and rhinorrhea present.  Mouth/Throat: Posterior oropharyngeal edema and posterior oropharyngeal erythema present. Tonsils are 2+ on the right. Tonsils are 2+ on the left.  Eyes: Pupils are equal, round, and reactive to light.  Neck: Normal range of motion. Neck supple. No thyromegaly present.  Cardiovascular: Normal rate, regular rhythm, normal heart sounds and intact distal pulses.  No murmur heard. Pulmonary/Chest: Effort normal and breath sounds normal. No respiratory distress. She has no wheezes.  Abdominal: Soft. Bowel sounds are normal. She exhibits no distension. There is no tenderness.  Musculoskeletal: Normal range of motion. She exhibits no edema or tenderness.  Neurological: She is alert and oriented to person, place, and time. She has normal reflexes. No cranial nerve deficit.  Skin: Skin is warm  and dry.  Psychiatric: She has a normal mood and affect. Her behavior is normal. Judgment and thought content normal.  Vitals reviewed.     BP 113/78   Pulse 83   Temp 98.7 F (37.1 C) (Oral)   Ht 5\' 4"  (1.626 m)   Wt 186 lb 9.6 oz (84.6 kg)   BMI 32.03 kg/m      Assessment & Plan:  Dana Lane was seen today for swollen tonsils.  Diagnoses and all orders for this visit:  Acute pharyngitis, unspecified etiology -     amoxicillin-clavulanate (AUGMENTIN) 875-125 MG tablet; Take 1 tablet by mouth 2 (two) times daily.   - Take meds as prescribed - Use a cool mist humidifier  -Use saline nose sprays frequently -Force fluids -For any cough or congestion  Use plain Mucinex- regular strength or max strength is fine -For fever or aces or pains- take tylenol or ibuprofen. -Throat lozenges if help -New toothbrush in 3 days   Jannifer Rodneyhristy Hawks, FNP

## 2017-12-30 NOTE — Patient Instructions (Signed)

## 2018-01-04 ENCOUNTER — Telehealth: Payer: Self-pay

## 2018-01-04 NOTE — Telephone Encounter (Signed)
[facility] - Left Msg 

## 2018-01-25 ENCOUNTER — Other Ambulatory Visit: Payer: Self-pay | Admitting: Physician Assistant

## 2018-01-30 DIAGNOSIS — H10013 Acute follicular conjunctivitis, bilateral: Secondary | ICD-10-CM | POA: Diagnosis not present

## 2018-02-05 ENCOUNTER — Ambulatory Visit: Payer: Commercial Managed Care - PPO | Admitting: Family

## 2018-02-05 DIAGNOSIS — W57XXXA Bitten or stung by nonvenomous insect and other nonvenomous arthropods, initial encounter: Secondary | ICD-10-CM | POA: Diagnosis not present

## 2018-02-05 DIAGNOSIS — L03116 Cellulitis of left lower limb: Secondary | ICD-10-CM | POA: Diagnosis not present

## 2018-02-05 DIAGNOSIS — R21 Rash and other nonspecific skin eruption: Secondary | ICD-10-CM | POA: Diagnosis not present

## 2018-02-16 ENCOUNTER — Ambulatory Visit: Payer: Commercial Managed Care - PPO | Admitting: Family

## 2018-02-16 ENCOUNTER — Encounter: Payer: Self-pay | Admitting: *Deleted

## 2018-02-16 ENCOUNTER — Encounter: Payer: Self-pay | Admitting: Family

## 2018-02-16 VITALS — BP 116/80 | HR 85 | Temp 98.0°F | Ht 64.0 in | Wt 192.2 lb

## 2018-02-16 DIAGNOSIS — F321 Major depressive disorder, single episode, moderate: Secondary | ICD-10-CM | POA: Diagnosis not present

## 2018-02-16 DIAGNOSIS — J301 Allergic rhinitis due to pollen: Secondary | ICD-10-CM

## 2018-02-16 DIAGNOSIS — J069 Acute upper respiratory infection, unspecified: Secondary | ICD-10-CM | POA: Diagnosis not present

## 2018-02-16 MED ORDER — FLUTICASONE PROPIONATE 50 MCG/ACT NA SUSP: 2.0000 | Freq: Every day | NASAL | 6 refills | Status: DC

## 2018-02-16 MED ORDER — ESCITALOPRAM OXALATE 20 MG PO TABS: 20.0000 mg | ORAL_TABLET | Freq: Every day | ORAL | 1 refills | Status: DC

## 2018-02-16 NOTE — Progress Notes (Signed)
   Subjective:    Patient ID: Dana Lane, female    DOB: 1996-05-26, 22 y.o.   MRN: [id number]  Chief Complaint  Patient presents with  . Depression    six week recheck    Sore Throat   This is a recurrent problem. The current episode started more than 1 month ago. The problem has been unchanged. There has been no fever. The pain is mild. Associated symptoms include congestion, coughing and trouble swallowing. Pertinent negatives include no ear pain. She has tried acetaminophen, gargles, cool liquids and NSAIDs for the symptoms. The treatment provided mild relief.  Depression         This is a chronic problem.  The current episode started more than 1 year ago.   The onset quality is gradual.   The problem occurs intermittently.  The problem has been waxing and waning since onset.  Associated symptoms include irritable, decreased interest and sad.  Associated symptoms include no helplessness and no hopelessness.  Past treatments include SSRIs - Selective serotonin reuptake inhibitors.     Review of Systems  HENT: Positive for congestion and trouble swallowing. Negative for ear pain.   Respiratory: Positive for cough.   Psychiatric/Behavioral: Positive for depression.  All other systems reviewed and are negative.      Objective:   Physical Exam  Constitutional: She is oriented to person, place, and time. She appears well-developed and well-nourished. She is irritable. No distress.  HENT:  Head: Normocephalic and atraumatic.  Right Ear: External ear normal.  Left Ear: External ear normal.  Nose: Mucosal edema and rhinorrhea present.  Mouth/Throat: Posterior oropharyngeal erythema present.  Eyes: Pupils are equal, round, and reactive to light.  Neck: Normal range of motion. Neck supple. No thyromegaly present.  Cardiovascular: Normal rate, regular rhythm, normal heart sounds and intact distal pulses.  No murmur heard. Pulmonary/Chest: Effort normal and breath sounds normal. No  respiratory distress. She has no wheezes.  Abdominal: Soft. Bowel sounds are normal. She exhibits no distension. There is no tenderness.  Musculoskeletal: Normal range of motion. She exhibits no edema or tenderness.  Neurological: She is alert and oriented to person, place, and time. She has normal reflexes. No cranial nerve deficit.  Skin: Skin is warm and dry.  Psychiatric: She has a normal mood and affect. Her behavior is normal. Judgment and thought content normal.  Vitals reviewed.     BP 116/80   Pulse 85   Temp 98 F (36.7 C) (Oral)   Ht 5\' 4"  (1.626 m)   Wt 192 lb 3.2 oz (87.2 kg)   BMI 32.99 kg/m      Assessment & Plan:  Dana Lane comes in today with chief complaint of Depression (six week recheck)   Diagnosis and orders addressed:  1. Current moderate episode of major depressive disorder, unspecified whether recurrent (HCC) Will increase Lexapro to 20 mg from 10 mg [facility] appt 02/27/18 Possible adverse effects discussed RTO in 6 months or if sooner if symptoms worsen or do not improve - escitalopram (LEXAPRO) 20 MG tablet; Take 1 tablet (20 mg total) by mouth daily.  Dispense: 90 tablet; Refill: 1  2. Allergic rhinitis due to pollen, unspecified seasonality  3. Viral upper respiratory tract infection - fluticasone (FLONASE) 50 MCG/ACT nasal spray; Place 2 sprays into both nostrils daily.  Dispense: 16 g; Refill: 6   Jannifer Rodneyhristy Hawks, FNP

## 2018-02-16 NOTE — Patient Instructions (Signed)
Living With Depression Everyone experiences occasional disappointment, sadness, and loss in their lives. When you are feeling down, blue, or sad for at least 2 weeks in a row, it may mean that you have depression. Depression can affect your thoughts and feelings, relationships, daily activities, and physical health. It is caused by changes in the way your brain functions. If you receive a diagnosis of depression, your health care provider will tell you which type of depression you have and what treatment options are available to you. If you are living with depression, there are ways to help you recover from it and also ways to prevent it from coming back. How to cope with lifestyle changes Coping with stress Stress is your body's reaction to life changes and events, both good and bad. Stressful situations may include:  Getting married.  The death of a spouse.  Losing a job.  Retiring.  Having a baby.  Stress can last just a few hours or it can be ongoing. Stress can play a major role in depression, so it is important to learn both how to cope with stress and how to think about it differently. Talk with your health care provider or a counselor if you would like to learn more about stress reduction. He or she may suggest some stress reduction techniques, such as:  Music therapy. This can include creating music or listening to music. Choose music that you enjoy and that inspires you.  Mindfulness-based meditation. This kind of meditation can be done while sitting or walking. It involves being aware of your normal breaths, rather than trying to control your breathing.  Centering prayer. This is a kind of meditation that involves focusing on a spiritual word or phrase. Choose a word, phrase, or sacred image that is meaningful to you and that brings you peace.  Deep breathing. To do this, expand your stomach and inhale slowly through your nose. Hold your breath for 3-5 seconds, then exhale  slowly, allowing your stomach muscles to relax.  Muscle relaxation. This involves intentionally tensing muscles then relaxing them.  Choose a stress reduction technique that fits your lifestyle and personality. Stress reduction techniques take time and practice to develop. Set aside 5-15 minutes a day to do them. Therapists can offer training in these techniques. The training may be covered by some insurance plans. Other things you can do to manage stress include:  Keeping a stress diary. This can help you learn what triggers your stress and ways to control your response.  Understanding what your limits are and saying no to requests or events that lead to a schedule that is too full.  Thinking about how you respond to certain situations. You may not be able to control everything, but you can control how you react.  Adding humor to your life by watching funny films or TV shows.  Making time for activities that help you relax and not feeling guilty about spending your time this way.  Medicines Your health care provider may suggest certain medicines if he or she feels that they will help improve your condition. Avoid using alcohol and other substances that may prevent your medicines from working properly (may interact). It is also important to:  Talk with your pharmacist or health care provider about all the medicines that you take, their possible side effects, and what medicines are safe to take together.  Make it your goal to take part in all treatment decisions (shared decision-making). This includes giving input on the side   effects of medicines. It is best if shared decision-making with your health care provider is part of your total treatment plan.  If your health care provider prescribes a medicine, you may not notice the full benefits of it for 4-8 weeks. Most people who are treated for depression need to be on medicine for at least 6-12 months after they feel better. If you are taking  medicines as part of your treatment, do not stop taking medicines without first talking to your health care provider. You may need to have the medicine slowly decreased (tapered) over time to decrease the risk of harmful side effects. Relationships Your health care provider may suggest family therapy along with individual therapy and drug therapy. While there may not be family problems that are causing you to feel depressed, it is still important to make sure your family learns as much as they can about your mental health. Having your family's support can help make your treatment successful. How to recognize changes in your condition Everyone has a different response to treatment for depression. Recovery from major depression happens when you have not had signs of major depression for two months. This may mean that you will start to:  Have more interest in doing activities.  Feel less hopeless than you did 2 months ago.  Have more energy.  Overeat less often, or have better or improving appetite.  Have better concentration.  Your health care provider will work with you to decide the next steps in your recovery. It is also important to recognize when your condition is getting worse. Watch for these signs:  Having fatigue or low energy.  Eating too much or too little.  Sleeping too much or too little.  Feeling restless, agitated, or hopeless.  Having trouble concentrating or making decisions.  Having unexplained physical complaints.  Feeling irritable, angry, or aggressive.  Get help as soon as you or your family members notice these symptoms coming back. How to get support and help from others How to talk with friends and family members about your condition Talking to friends and family members about your condition can provide you with one way to get support and guidance. Reach out to trusted friends or family members, explain your symptoms to them, and let them know that you are  working with a health care provider to treat your depression. Financial resources Not all insurance plans cover mental health care, so it is important to check with your insurance carrier. If paying for co-pays or counseling services is a problem, search for a local or county mental health care center. They may be able to offer public mental health care services at low or no cost when you are not able to see a private health care provider. If you are taking medicine for depression, you may be able to get the generic form, which may be less expensive. Some makers of prescription medicines also offer help to patients who cannot afford the medicines they need. Follow these instructions at home:  Get the right amount and quality of sleep.  Cut down on using caffeine, tobacco, alcohol, and other potentially harmful substances.  Try to exercise, such as walking or lifting small weights.  Take over-the-counter and prescription medicines only as told by your health care provider.  Eat a healthy diet that includes plenty of vegetables, fruits, whole grains, low-fat dairy products, and lean protein. Do not eat a lot of foods that are high in solid fats, added sugars, or salt.    Keep all follow-up visits as told by your health care provider. This is important. Contact a health care provider if:  You stop taking your antidepressant medicines, and you have any of these symptoms: ? Nausea. ? Headache. ? Feeling lightheaded. ? Chills and body aches. ? Not being able to sleep (insomnia).  You or your friends and family think your depression is getting worse. Get help right away if:  You have thoughts of hurting yourself or others. If you ever feel like you may hurt yourself or others, or have thoughts about taking your own life, get help right away. You can go to your nearest emergency department or call:  Your local emergency services (911 in the [place]  A suicide crisis helpline, such as the  National Suicide Prevention Lifeline at 1-800-273-8255. This is open 24-hours a day.  Summary  If you are living with depression, there are ways to help you recover from it and also ways to prevent it from coming back.  Work with your health care team to create a management plan that includes counseling, stress management techniques, and healthy lifestyle habits. This information is not intended to replace advice given to you by your health care provider. Make sure you discuss any questions you have with your health care provider. Document Released: 05/12/2016 Document Revised: 05/12/2016 Document Reviewed: 05/12/2016 Elsevier Interactive Patient Education  2018 [employer]  

## 2018-02-24 NOTE — Progress Notes (Deleted)
Psychiatric Initial Adult Assessment   Patient Identification: Dana Lane MRN:  [id number] Date of Evaluation:  02/24/2018 Referral Source: *** Chief Complaint:   Visit Diagnosis: No diagnosis found.  History of Present Illness:   Dana Lane is a 22 y.o. year old female with a history of depression, who is referred for    Associated Signs/Symptoms: Depression Symptoms:  {DEPRESSION SYMPTOMS:20000} (Hypo) Manic Symptoms:  {BHH MANIC SYMPTOMS:22872} Anxiety Symptoms:  {BHH ANXIETY SYMPTOMS:22873} Psychotic Symptoms:  {BHH PSYCHOTIC SYMPTOMS:22874} PTSD Symptoms: {BHH PTSD SYMPTOMS:22875}  Past Psychiatric History:  Outpatient:  Psychiatry admission:  Previous suicide attempt:  Past trials of medication:  History of violence:   Previous Psychotropic Medications: {YES/NO:21197}  Substance Abuse History in the last 12 months:  {yes no:314532}  Consequences of Substance Abuse: {BHH CONSEQUENCES OF SUBSTANCE ABUSE:22880}  Past Medical History:  Past Medical History:  Diagnosis Date  . Tonsillitis     Past Surgical History:  Procedure Laterality Date  . EYE SURGERY      Family Psychiatric History: ***  Family History:  Family History  Problem Relation Age of Onset  . Diabetes Mother   . Cancer Father        prostate    Social History:   Social History   Socioeconomic History  . Marital status: Single    Spouse name: Not on file  . Number of children: Not on file  . Years of education: Not on file  . Highest education level: Not on file  Occupational History  . Not on file  Social Needs  . Financial resource strain: Not on file  . Food insecurity:    Worry: Not on file    Inability: Not on file  . Transportation needs:    Medical: Not on file    Non-medical: Not on file  Tobacco Use  . Smoking status: Never Smoker  . Smokeless tobacco: Never Used  Substance and Sexual Activity  . Alcohol use: No  . Drug use: No  . Sexual activity: Not on file   Lifestyle  . Physical activity:    Days per week: Not on file    Minutes per session: Not on file  . Stress: Not on file  Relationships  . Social connections:    Talks on phone: Not on file    Gets together: Not on file    Attends religious service: Not on file    Active member of club or organization: Not on file    Attends meetings of clubs or organizations: Not on file    Relationship status: Not on file  Other Topics Concern  . Not on file  Social History Narrative  . Not on file    Additional Social History: ***  Allergies:   Allergies  Allergen Reactions  . Poison Oak Extract [Poison Oak Extract] Itching and Rash  . Chocolate Itching and Swelling  . Eggs Or Egg-Derived Products Diarrhea and Other (See Comments)    High fever    Metabolic Disorder Labs: No results found for: HGBA1C, MPG No results found for: PROLACTIN Lab Results  Component Value Date   CHOL 176 07/15/2016   TRIG 181 (H) 07/15/2016   HDL 49 (L) 07/15/2016   CHOLHDL 3.6 07/15/2016   VLDL 36 (H) 07/15/2016   LDLCALC 91 07/15/2016     Current Medications: Current Outpatient Medications  Medication Sig Dispense Refill  . doxepin (SINEQUAN) 10 MG capsule Take one (1) capsule by mouth at bedtime    . escitalopram (LEXAPRO)  20 MG tablet Take 1 tablet (20 mg total) by mouth daily. 90 tablet 1  . fluticasone (FLONASE) 50 MCG/ACT nasal spray Place 2 sprays into both nostrils daily. 16 g 6  . omeprazole (PRILOSEC) 40 MG capsule Take 40 mg by mouth daily.  3   No current facility-administered medications for this visit.     Neurologic: Headache: No Seizure: No Paresthesias:No  Musculoskeletal: Strength & Muscle Tone: within normal limits Gait & Station: normal Patient leans: N/A  Psychiatric Specialty Exam: ROS  There were no vitals taken for this visit.There is no height or weight on file to calculate BMI.  General Appearance: Fairly Groomed  Eye Contact:  Good  Speech:  Clear and  Coherent  Volume:  Normal  Mood:  {BHH MOOD:22306}  Affect:  {Affect (PAA):22687}  Thought Process:  Coherent  Orientation:  Full (Time, Place, and Person)  Thought Content:  Logical  Suicidal Thoughts:  {ST/HT (PAA):22692}  Homicidal Thoughts:  {ST/HT (PAA):22692}  Memory:  Immediate;   Good  Judgement:  {Judgement (PAA):22694}  Insight:  {Insight (PAA):22695}  Psychomotor Activity:  Normal  Concentration:  Concentration: Good and Attention Span: Good  Recall:  Good  Fund of Knowledge:Good  Language: Good  Akathisia:  No  Handed:  Right  AIMS (if indicated):  N/A  Assets:  Communication Skills Desire for Improvement  ADL's:  Intact  Cognition: WNL  Sleep:  ***   Assessment  Plan  The patient demonstrates the following risk factors for suicide: Chronic risk factors for suicide include: {Chronic Risk Factors for PJSRPRX:45859292}. Acute risk factors for suicide include: {Acute Risk Factors for KMQKMMN:81771165}. Protective factors for this patient include: {Protective Factors for Suicide BXUX:83338329}. Considering these factors, the overall suicide risk at this point appears to be {Desc; low/moderate/high:110033}. Patient {ACTION; IS/IS VBT:66060045} appropriate for outpatient follow up.   Treatment Plan Summary: Plan as above   Neysa Hotter, MD 9/4/20194:03 PM

## 2018-03-01 ENCOUNTER — Ambulatory Visit (HOSPITAL_COMMUNITY): Payer: Self-pay | Admitting: Psychiatry

## 2018-03-08 DIAGNOSIS — R110 Nausea: Secondary | ICD-10-CM | POA: Diagnosis not present

## 2018-03-08 DIAGNOSIS — L550 Sunburn of first degree: Secondary | ICD-10-CM | POA: Diagnosis not present

## 2018-03-09 DIAGNOSIS — R110 Nausea: Secondary | ICD-10-CM | POA: Diagnosis not present

## 2018-03-09 DIAGNOSIS — L550 Sunburn of first degree: Secondary | ICD-10-CM | POA: Diagnosis not present

## 2018-03-09 DIAGNOSIS — Z79899 Other long term (current) drug therapy: Secondary | ICD-10-CM | POA: Diagnosis not present
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
## 2018-03-25 ENCOUNTER — Encounter (HOSPITAL_COMMUNITY): Payer: Self-pay | Admitting: Psychiatry

## 2018-03-25 ENCOUNTER — Ambulatory Visit (HOSPITAL_COMMUNITY): Payer: Commercial Managed Care - PPO | Admitting: Psychiatry

## 2018-03-25 VITALS — BP 118/84 | HR 69 | Ht 64.0 in | Wt 185.0 lb

## 2018-03-25 DIAGNOSIS — Z62810 Personal history of physical and sexual abuse in childhood: Secondary | ICD-10-CM | POA: Diagnosis not present

## 2018-03-25 DIAGNOSIS — F331 Major depressive disorder, recurrent, moderate: Secondary | ICD-10-CM

## 2018-03-25 DIAGNOSIS — Z91411 Personal history of adult psychological abuse: Secondary | ICD-10-CM | POA: Diagnosis not present

## 2018-03-25 DIAGNOSIS — R45851 Suicidal ideations: Secondary | ICD-10-CM

## 2018-03-25 HISTORY — DX: Major depressive disorder, recurrent, moderate: F33.1

## 2018-03-25 LAB — TSH: TSH: 1.01 mIU/L

## 2018-03-25 MED ORDER — BUPROPION HCL ER (XL) 150 MG PO TB24: 150.0000 mg | ORAL_TABLET | Freq: Every day | ORAL | 0 refills | Status: DC

## 2018-03-25 NOTE — Patient Instructions (Signed)
1. Continue lexapro 20 mg daily  2. Start Wellbutrin 150 mg daily  3. Discontinue doxepin 4. Check TSH 5. Return to clinic in one month for 30 mins .6. CONTACT INFORMATION  What to do if you need to get in touch with someone regarding a psychiatric issue:  1. EMERGENCY: For psychiatric emergencies (if you are suicidal or if there are any other safety issues) call 911 and/or go to your nearest Emergency Room immediately.   2. IF YOU NEED SOMEONE TO TALK TO RIGHT NOW: Given my clinical responsibilities, I may not be able to speak with you over the phone for a prolonged period of time.  a. You may always call The National Suicide Prevention Lifeline at 1-800-273-TALK (602)868-9187).  b. Your county of residence will also have local crisis services. For [facility]: [facility] at [phone] (24 Hour Crisis Hotline)

## 2018-04-23 ENCOUNTER — Ambulatory Visit: Payer: Commercial Managed Care - PPO | Admitting: Family
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
## 2018-04-27 ENCOUNTER — Ambulatory Visit (HOSPITAL_COMMUNITY): Payer: Commercial Managed Care - PPO | Admitting: Psychiatry

## 2018-04-30 ENCOUNTER — Encounter: Payer: Self-pay | Admitting: Family

## 2018-04-30 ENCOUNTER — Ambulatory Visit: Payer: Commercial Managed Care - PPO | Admitting: Family

## 2018-04-30 VITALS — BP 125/83 | HR 105 | Temp 98.9°F | Ht 64.0 in | Wt 189.2 lb

## 2018-04-30 DIAGNOSIS — J029 Acute pharyngitis, unspecified: Secondary | ICD-10-CM | POA: Diagnosis not present

## 2018-04-30 LAB — RAPID STREP SCREEN (MED CTR MEBANE ONLY): Strep Gp A Ag, IA W/Reflex: NEGATIVE

## 2018-04-30 LAB — CULTURE, GROUP A STREP

## 2018-04-30 MED ORDER — AZITHROMYCIN 250 MG PO TABS: ORAL_TABLET | ORAL | 0 refills | Status: DC

## 2018-04-30 NOTE — Patient Instructions (Signed)

## 2018-04-30 NOTE — Progress Notes (Signed)
   Subjective:    Patient ID: Dana Lane, female    DOB: January 04, 1996, 22 y.o.   MRN: [id number]  Chief Complaint  Patient presents with  . Sore Throat  . Ear Pain    Sore Throat   This is a recurrent problem. The current episode started in the past 7 days. The problem has been gradually worsening. There has been no fever. The pain is at a severity of 8/10. The pain is moderate. Associated symptoms include ear pain, a hoarse voice, a plugged ear sensation, swollen glands and trouble swallowing. Pertinent negatives include no congestion, coughing or headaches. She has tried acetaminophen and NSAIDs for the symptoms.      Review of Systems  HENT: Positive for ear pain, hoarse voice and trouble swallowing. Negative for congestion.   Respiratory: Negative for cough.   Neurological: Negative for headaches.  All other systems reviewed and are negative.      Objective:   Physical Exam  Constitutional: She is oriented to person, place, and time. She appears well-developed and well-nourished. No distress.  HENT:  Head: Normocephalic and atraumatic.  Right Ear: External ear normal.  Mouth/Throat: Oropharyngeal exudate, posterior oropharyngeal edema and posterior oropharyngeal erythema present. Tonsils are 2+ on the right. Tonsils are 2+ on the left.  Eyes: Pupils are equal, round, and reactive to light.  Neck: Normal range of motion. Neck supple. No thyromegaly present.  Cardiovascular: Normal rate, regular rhythm, normal heart sounds and intact distal pulses.  No murmur heard. Pulmonary/Chest: Effort normal and breath sounds normal. No respiratory distress. She has no wheezes.  Abdominal: Soft. Bowel sounds are normal. She exhibits no distension. There is no tenderness.  Musculoskeletal: Normal range of motion. She exhibits no edema or tenderness.  Neurological: She is alert and oriented to person, place, and time. She has normal reflexes. No cranial nerve deficit.  Skin: Skin is warm and  dry.  Psychiatric: She has a normal mood and affect. Her behavior is normal. Judgment and thought content normal.  Vitals reviewed.     BP 125/83   Pulse (!) 105   Temp 98.9 F (37.2 C) (Oral)   Ht 5\' 4"  (1.626 m)   Wt 189 lb 3.2 oz (85.8 kg)   BMI 32.48 kg/m      Assessment & Plan:  Dana Lane comes in today with chief complaint of Sore Throat and Ear Pain   Diagnosis and orders addressed:  1. Sore throat - Rapid Strep Screen ([facility] ONLY) - Culture, Group A Strep - GC NAA, Pharyngeal - azithromycin (ZITHROMAX) 250 MG tablet; Take 500 mg once, then 250 mg for four days  Dispense: 6 tablet; Refill: 0  2. Pharyngitis, unspecified etiology - Take meds as prescribed - Use a cool mist humidifier  -Use saline nose sprays frequently -Force fluids -For any cough or congestion  Use plain Mucinex- regular strength or max strength is fine -For fever or aces or pains- take tylenol or ibuprofen. -Throat lozenges if help -New toothbrush in 3 days Culture and GC pending - azithromycin (ZITHROMAX) 250 MG tablet; Take 500 mg once, then 250 mg for four days  Dispense: 6 tablet; Refill: 0   Jannifer Rodney, FNP

## 2018-05-02 LAB — GC NAA, PHARYNGEAL: N GONORRHOEA RRNA NPH QL PCR: NEGATIVE

## 2018-05-02 LAB — CULTURE, GROUP A STREP

## 2018-07-19 ENCOUNTER — Ambulatory Visit: Payer: Commercial Managed Care - PPO | Admitting: Family

## 2018-07-19 ENCOUNTER — Encounter: Payer: Self-pay | Admitting: Family

## 2018-07-19 VITALS — BP 117/78 | HR 92 | Temp 98.1°F | Ht 64.0 in | Wt 192.2 lb

## 2018-07-19 DIAGNOSIS — R0683 Snoring: Secondary | ICD-10-CM

## 2018-07-19 DIAGNOSIS — R400 Somnolence: Secondary | ICD-10-CM | POA: Diagnosis not present

## 2018-07-19 DIAGNOSIS — R5383 Other fatigue: Secondary | ICD-10-CM | POA: Diagnosis not present

## 2018-07-19 DIAGNOSIS — K219 Gastro-esophageal reflux disease without esophagitis: Secondary | ICD-10-CM

## 2018-07-19 MED ORDER — OMEPRAZOLE 40 MG PO CPDR: 40.0000 mg | DELAYED_RELEASE_CAPSULE | Freq: Every day | ORAL | 3 refills | Status: DC

## 2018-07-19 NOTE — Patient Instructions (Signed)

## 2018-07-19 NOTE — Progress Notes (Signed)
Subjective:    Patient ID: Dana Lane, female    DOB: 1996-02-01, 23 y.o.   MRN: [id number]  Chief Complaint  Patient presents with  . fatigue for months    HPI Pt presents to the office today with complaints of fatigue that started over the 2-3 months, but has worsen over the last month. She states if she sits still she will fall asleep.   Denies SOB and reports she sleeps about 8 hours a night, but still feels tired when she wakes up. Her boyfriend  Reports she snores and "stops breathing" about every other night.   She states she quit taking her Wellbutrin because it was giving her headaches, but continues to take the Lexapro night.  Requesting refill omeprazole 40 mg daily. States she has heartburn intermittent, but the PPI  helps.   Review of Systems  All other systems reviewed and are negative.      Objective:   Physical Exam Vitals signs reviewed.  Constitutional:      General: She is not in acute distress.    Appearance: She is well-developed.  HENT:     Head: Normocephalic and atraumatic.  Eyes:     Pupils: Pupils are equal, round, and reactive to light.  Neck:     Musculoskeletal: Normal range of motion and neck supple.     Thyroid: No thyromegaly.  Cardiovascular:     Rate and Rhythm: Normal rate and regular rhythm.     Heart sounds: Normal heart sounds. No murmur.  Pulmonary:     Effort: Pulmonary effort is normal. No respiratory distress.     Breath sounds: Normal breath sounds. No wheezing.  Abdominal:     General: Bowel sounds are normal. There is no distension.     Palpations: Abdomen is soft.     Tenderness: There is no abdominal tenderness.  Musculoskeletal: Normal range of motion.        General: No tenderness.  Skin:    General: Skin is warm and dry.  Neurological:     Mental Status: She is alert and oriented to person, place, and time.     Cranial Nerves: No cranial nerve deficit.     Deep Tendon Reflexes: Reflexes are normal and symmetric.   Psychiatric:        Behavior: Behavior normal.        Thought Content: Thought content normal.        Judgment: Judgment normal.       BP 117/78   Pulse 92   Temp 98.1 F (36.7 C) (Oral)   Ht _0  (1.626 m)   Wt 192 lb 3.2 oz (87.2 kg)   BMI 32.99 kg/m      Assessment & Plan:  Dana Lane comes in today with chief complaint of fatigue for months   Diagnosis and orders addressed:  1. Fatigue, unspecified type -Rest -Encourage healthy diet and exercise  - Given symptoms of snoring and periods of apnea during sleep we will do referral to sleep study to rule out sleep apena - Anemia Profile B - BMP8+EGFR - TSH - Ambulatory referral to Pulmonology  2. Snoring - BMP8+EGFR - Ambulatory referral to Pulmonology  3. Daytime sleepiness - BMP8+EGFR - Ambulatory referral to Pulmonology  4. Gastroesophageal reflux disease, esophagitis presence not specified -Diet discussed- Avoid fried, spicy, citrus foods, caffeine and alcohol -Do not eat 2-3 hours before bedtime -Encouraged small frequent meals -Avoid NSAID's -RTO prn  - omeprazole (PRILOSEC) 40 MG capsule; Take  1 capsule (40 mg total) by mouth daily.  Dispense: 90 capsule; Refill: [place], FNP

## 2018-07-20 LAB — ANEMIA PROFILE B
Basophils Absolute: 0.1 10*3/uL (ref 0.0–0.2)
Basos: 1 %
EOS (ABSOLUTE): 0.2 10*3/uL (ref 0.0–0.4)
Eos: 2 %
Ferritin: 25 ng/mL (ref 15–150)
Folate: 7.5 ng/mL (ref >3.0)
Hematocrit: 38.7 % (ref 34.0–46.6)
Hemoglobin: 13.1 g/dL (ref 11.1–15.9)
Immature Grans (Abs): 0 10*3/uL (ref 0.0–0.1)
Immature Granulocytes: 0 %
Iron Saturation: 19 % (ref 15–55)
Iron: 68 ug/dL (ref 27–159)
Lymphocytes Absolute: 2 10*3/uL (ref 0.7–3.1)
Lymphs: 28 %
MCH: 29.9 pg (ref 26.6–33.0)
MCHC: 33.9 g/dL (ref 31.5–35.7)
MCV: 88 fL (ref 79–97)
Monocytes Absolute: 0.4 10*3/uL (ref 0.1–0.9)
Monocytes: 5 %
Neutrophils Absolute: 4.8 10*3/uL (ref 1.4–7.0)
Neutrophils: 64 %
Platelets: 343 10*3/uL (ref 150–450)
RBC: 4.38 x10E6/uL (ref 3.77–5.28)
RDW: 13 % (ref 11.7–15.4)
Retic Ct Pct: 1 % (ref 0.6–2.6)
Total Iron Binding Capacity: 358 ug/dL (ref 250–450)
UIBC: 290 ug/dL (ref 131–425)
Vitamin B-12: 438 pg/mL (ref 232–1245)
WBC: 7.4 10*3/uL (ref 3.4–10.8)

## 2018-07-20 LAB — BMP8+EGFR
BUN/Creatinine Ratio: 16 (ref 9–23)
BUN: 12 mg/dL (ref 6–20)
CO2: 21 mmol/L (ref 20–29)
Calcium: 8.9 mg/dL (ref 8.7–10.2)
Chloride: 104 mmol/L (ref 96–106)
Creatinine, Ser: 0.73 mg/dL (ref 0.57–1.00)
GFR calc Af Amer: 133 mL/min/{1.73_m2} (ref >59)
GFR calc non Af Amer: 116 mL/min/{1.73_m2} (ref >59)
Glucose: 96 mg/dL (ref 65–99)
Potassium: 4.4 mmol/L (ref 3.5–5.2)
Sodium: 141 mmol/L (ref 134–144)

## 2018-07-20 LAB — TSH: TSH: 1.16 u[IU]/mL (ref 0.450–4.500)

## 2018-08-19 ENCOUNTER — Encounter: Payer: Self-pay | Admitting: Family

## 2018-08-19 ENCOUNTER — Ambulatory Visit: Payer: Commercial Managed Care - PPO | Admitting: Family

## 2018-08-19 VITALS — BP 122/85 | HR 84 | Temp 97.9°F | Ht 64.0 in | Wt 193.4 lb

## 2018-08-19 DIAGNOSIS — E669 Obesity, unspecified: Secondary | ICD-10-CM

## 2018-08-19 DIAGNOSIS — F331 Major depressive disorder, recurrent, moderate: Secondary | ICD-10-CM | POA: Diagnosis not present

## 2018-08-19 DIAGNOSIS — K219 Gastro-esophageal reflux disease without esophagitis: Secondary | ICD-10-CM

## 2018-08-19 DIAGNOSIS — R5383 Other fatigue: Secondary | ICD-10-CM

## 2018-08-19 MED ORDER — VENLAFAXINE HCL ER 75 MG PO CP24: 75.0000 mg | ORAL_CAPSULE | Freq: Every day | ORAL | 1 refills | Status: DC

## 2018-08-19 NOTE — Progress Notes (Signed)
Subjective:    Patient ID: Dana Lane, female    DOB: [birth year], 23 y.o.   MRN: [id number]  Chief Complaint  Patient presents with  . Medical Management of Chronic Issues    six month recheck   PT presents to the office today for chronic follow up. She has seen [facility], but stopped. States her depression is not well controlled.   PT continues to have fatigue and has appointment tomorrow to discuss a sleep study.  Depression         This is a chronic problem.  The current episode started more than 1 year ago.   The onset quality is gradual.   The problem occurs intermittently.  The problem has been waxing and waning since onset.  Associated symptoms include fatigue, helplessness, hopelessness, decreased interest and sad.  Past treatments include SSRIs - Selective serotonin reuptake inhibitors. Gastroesophageal Reflux  She complains of belching and heartburn. She reports no coughing. This is a chronic problem. The current episode started more than 1 year ago. The problem occurs occasionally. Associated symptoms include fatigue. She has tried a PPI for the symptoms. The treatment provided moderate relief.      Review of Systems  Constitutional: Positive for fatigue.  Respiratory: Negative for cough.   Gastrointestinal: Positive for heartburn.  Psychiatric/Behavioral: Positive for depression.  All other systems reviewed and are negative.      Objective:   Physical Exam Vitals signs reviewed.  Constitutional:      General: She is not in acute distress.    Appearance: She is well-developed.  HENT:     Head: Normocephalic and atraumatic.     Right Ear: Tympanic membrane normal.     Left Ear: Tympanic membrane normal.  Eyes:     Pupils: Pupils are equal, round, and reactive to light.  Neck:     Musculoskeletal: Normal range of motion and neck supple.     Thyroid: No thyromegaly.  Cardiovascular:     Rate and Rhythm: Normal rate and regular rhythm.     Heart sounds:  Normal heart sounds. No murmur.  Pulmonary:     Effort: Pulmonary effort is normal. No respiratory distress.     Breath sounds: Normal breath sounds. No wheezing.  Abdominal:     General: Bowel sounds are normal. There is no distension.     Palpations: Abdomen is soft.     Tenderness: There is no abdominal tenderness.  Musculoskeletal: Normal range of motion.        General: No tenderness.  Skin:    General: Skin is warm and dry.  Neurological:     Mental Status: She is alert and oriented to person, place, and time.     Cranial Nerves: No cranial nerve deficit.     Deep Tendon Reflexes: Reflexes are normal and symmetric.  Psychiatric:        Behavior: Behavior normal.        Thought Content: Thought content normal.        Judgment: Judgment normal.       BP 122/85   Pulse 84   Temp 97.9 F (36.6 C) (Oral)   Ht 5\' 4"  (1.626 m)   Wt 193 lb 6.4 oz (87.7 kg)   BMI 33.20 kg/m      Assessment & Plan:  Dana Lane comes in today with chief complaint of Medical Management of Chronic Issues (six month recheck)   Diagnosis and orders addressed:  1. Gastroesophageal reflux disease, esophagitis presence  not specified -Diet discussed- Avoid fried, spicy, citrus foods, caffeine and alcohol -Do not eat 2-3 hours before bedtime -Encouraged small frequent meals -Avoid NSAID's  2. MDD (major depressive disorder), recurrent episode, moderate (HCC) PT to stop Lexapro and start Effexor  Stress management  Follow up in 6 weeks  - venlafaxine XR (EFFEXOR XR) 75 MG 24 hr capsule; Take 1 capsule (75 mg total) by mouth daily with breakfast.  Dispense: 90 capsule; Refill: 1  3. Obesity (BMI 30-39.9)  4. Fatigue, unspecified type Keep sleep study appt   Labs pending Health Maintenance reviewed Diet and exercise encouraged  Follow up plan: 6 weeks to recheck Depression  Jannifer Rodney, FNP

## 2018-08-19 NOTE — Patient Instructions (Signed)

## 2018-08-20 ENCOUNTER — Ambulatory Visit (INDEPENDENT_AMBULATORY_CARE_PROVIDER_SITE_OTHER): Payer: Commercial Managed Care - PPO | Admitting: Pulmonary Disease

## 2018-08-20 ENCOUNTER — Encounter: Payer: Self-pay | Admitting: Pulmonary Disease

## 2018-08-20 VITALS — BP 116/72 | HR 101

## 2018-08-20 DIAGNOSIS — G478 Other sleep disorders: Secondary | ICD-10-CM

## 2018-08-20 NOTE — Progress Notes (Signed)
Dana Lane    [id number]    10-17-1995  Primary Care Physician:Hawks, Edilia Bo, FNP  Referring Physician: Junie Spencer, FNP [address] [place], [place] [address]  Chief complaint:   Nonrestorative sleep, history of snoring  HPI:  Patient with a history of snoring, tired during the day Usually goes to bed about 10-12, wakes up about 630 Has significant sleep inertia in the morning Denies any dryness of her mouth in the mornings Occasional headaches in the mornings She has been told about gasping respirations at night  She is gained about 20 pounds in the last year  She has a brother who snores significantly  No pertinent occupational history  Never smoker  History of depression/anxiety-well-controlled on current treatment  Outpatient Encounter Medications as of 08/20/2018  Medication Sig  . fluticasone (FLONASE) 50 MCG/ACT nasal spray Place 2 sprays into both nostrils daily.  Marland Kitchen omeprazole (PRILOSEC) 40 MG capsule Take 1 capsule (40 mg total) by mouth daily.  Marland Kitchen venlafaxine XR (EFFEXOR XR) 75 MG 24 hr capsule Take 1 capsule (75 mg total) by mouth daily with breakfast.   No facility-administered encounter medications on file as of 08/20/2018.     Allergies as of 08/20/2018 - Review Complete 08/20/2018  Allergen Reaction Noted  . Poison oak extract [poison oak extract] Itching and Rash 01/09/2016  . Chocolate Itching and Swelling 12/07/2014  . Eggs or egg-derived products Diarrhea and Other (See Comments) 12/07/2014    Past Medical History:  Diagnosis Date  . Tonsillitis     Past Surgical History:  Procedure Laterality Date  . EYE SURGERY      Family History  Problem Relation Age of Onset  . Diabetes Mother   . Cancer Father        prostate  . Depression Sister     Social History   Socioeconomic History  . Marital status: Single    Spouse name: Not on file  . Number of children: Not on file  . Years of education: Not on file  .  Highest education level: Not on file  Occupational History  . Not on file  Social Needs  . Financial resource strain: Not on file  . Food insecurity:    Worry: Not on file    Inability: Not on file  . Transportation needs:    Medical: Not on file    Non-medical: Not on file  Tobacco Use  . Smoking status: Never Smoker  . Smokeless tobacco: Never Used  Substance and Sexual Activity  . Alcohol use: No  . Drug use: No  . Sexual activity: Not on file  Lifestyle  . Physical activity:    Days per week: Not on file    Minutes per session: Not on file  . Stress: Not on file  Relationships  . Social connections:    Talks on phone: Not on file    Gets together: Not on file    Attends religious service: Not on file    Active member of club or organization: Not on file    Attends meetings of clubs or organizations: Not on file    Relationship status: Not on file  . Intimate partner violence:    Fear of current or ex partner: Not on file    Emotionally abused: Not on file    Physically abused: Not on file    Forced sexual activity: Not on file  Other Topics Concern  . Not  on file  Social History Narrative  . Not on file    Review of Systems  Constitutional: Positive for fatigue.  HENT: Negative.   Respiratory: Positive for apnea.   Cardiovascular: Negative.   Gastrointestinal: Negative.   Psychiatric/Behavioral: Positive for sleep disturbance.  All other systems reviewed and are negative.   Vitals:   08/20/18 1425  BP: 116/72  Pulse: (!) 101  SpO2: 98%     Physical Exam  Constitutional: She appears well-developed and well-nourished.  HENT:  Head: Normocephalic and atraumatic.  Mallampati 2,  Eyes: Pupils are equal, round, and reactive to light. Conjunctivae are normal. Right eye exhibits no discharge. Left eye exhibits no discharge.  Neck: Normal range of motion. Neck supple. No tracheal deviation present. No thyromegaly present.  Cardiovascular: Normal rate and  regular rhythm.  Pulmonary/Chest: Effort normal and breath sounds normal. No respiratory distress. She has no wheezes. She has no rales. She exhibits no tenderness.   Results of the Epworth flowsheet 08/20/2018  Sitting and reading 0  Watching TV 1  Sitting, inactive in a public place (e.g. a theatre or a meeting) 0  As a passenger in a car for an hour without a break 3  Lying down to rest in the afternoon when circumstances permit 3  Sitting and talking to someone 0  Sitting quietly after a lunch without alcohol 3  In a car, while stopped for a few minutes in traffic 0  Total score 10   Assessment:   Excessive daytime sleepiness  Moderate probability of significant sleep disordered breathing -Nonrestorative sleep, gasping respirations at night Significant snoring  Plan/Recommendations:  Pathophysiology of sleep disordered breathing discussed with the patient Treatment options discussed with the patient  We will schedule the patient for home sleep study  Treatment options as discussed  Importance of weight loss and regular exercise discussed with the patient  I will see her back in the office in about 6 weeks following initiation of treatment   Virl Diamond MD [facility] 08/20/2018, 2:45 PM  CC: Junie Spencer, FNP

## 2018-08-20 NOTE — Patient Instructions (Signed)
Moderate probability of significant obstructive sleep apnea  Excessive daytime sleepiness  We will schedule him for home sleep study We will see back in the office about 4 to 6 weeks following initiation of treatment  Call with any significant concerns Sleep Apnea Sleep apnea is a condition in which breathing pauses or becomes shallow during sleep. Episodes of sleep apnea usually last 10 seconds or longer, and they may occur as many as 20 times an hour. Sleep apnea disrupts your sleep and keeps your body from getting the rest that it needs. This condition can increase your risk of certain health problems, including:  Heart attack.  Stroke.  Obesity.  Diabetes.  Heart failure.  Irregular heartbeat. There are three kinds of sleep apnea:  Obstructive sleep apnea. This kind is caused by a blocked or collapsed airway.  Central sleep apnea. This kind happens when the part of the brain that controls breathing does not send the correct signals to the muscles that control breathing.  Mixed sleep apnea. This is a combination of obstructive and central sleep apnea. What are the causes? The most common cause of this condition is a collapsed or blocked airway. An airway can collapse or become blocked if:  Your throat muscles are abnormally relaxed.  Your tongue and tonsils are larger than normal.  You are overweight.  Your airway is smaller than normal. What increases the risk? This condition is more likely to develop in people who:  Are overweight.  Smoke.  Have a smaller than normal airway.  Are elderly.  Are female.  Drink alcohol.  Take sedatives or tranquilizers.  Have a family history of sleep apnea. What are the signs or symptoms? Symptoms of this condition include:  Trouble staying asleep.  Daytime sleepiness and tiredness.  Irritability.  Loud snoring.  Morning headaches.  Trouble concentrating.  Forgetfulness.  Decreased interest in  sex.  Unexplained sleepiness.  Mood swings.  Personality changes.  Feelings of depression.  Waking up often during the night to urinate.  Dry mouth.  Sore throat. How is this diagnosed? This condition may be diagnosed with:  A medical history.  A physical exam.  A series of tests that are done while you are sleeping (sleep study). These tests are usually done in a sleep lab, but they may also be done at home. How is this treated? Treatment for this condition aims to restore normal breathing and to ease symptoms during sleep. It may involve managing health issues that can affect breathing, such as high blood pressure or obesity. Treatment may include:  Sleeping on your side.  Using a decongestant if you have nasal congestion.  Avoiding the use of depressants, including alcohol, sedatives, and narcotics.  Losing weight if you are overweight.  Making changes to your diet.  Quitting smoking.  Using a device to open your airway while you sleep, such as: ? An oral appliance. This is a custom-made mouthpiece that shifts your lower jaw forward. ? A continuous positive airway pressure (CPAP) device. This device delivers oxygen to your airway through a mask. ? A nasal expiratory positive airway pressure (EPAP) device. This device has valves that you put into each nostril. ? A bi-level positive airway pressure (BPAP) device. This device delivers oxygen to your airway through a mask.  Surgery if other treatments do not work. During surgery, excess tissue is removed to create a wider airway. It is important to get treatment for sleep apnea. Without treatment, this condition can lead to:  High  blood pressure.  Coronary artery disease.  (Men) An inability to achieve or maintain an erection (impotence).  Reduced thinking abilities. Follow these instructions at home:  Make any lifestyle changes that your health care provider recommends.  Eat a healthy, well-balanced  diet.  Take over-the-counter and prescription medicines only as told by your health care provider.  Avoid using depressants, including alcohol, sedatives, and narcotics.  Take steps to lose weight if you are overweight.  If you were given a device to open your airway while you sleep, use it only as told by your health care provider.  Do not use any tobacco products, such as cigarettes, chewing tobacco, and e-cigarettes. If you need help quitting, ask your health care provider.  Keep all follow-up visits as told by your health care provider. This is important. Contact a health care provider if:  The device that you received to open your airway during sleep is uncomfortable or does not seem to be working.  Your symptoms do not improve.  Your symptoms get worse. Get help right away if:  You develop chest pain.  You develop shortness of breath.  You develop discomfort in your back, arms, or stomach.  You have trouble speaking.  You have weakness on one side of your body.  You have drooping in your face. These symptoms may represent a serious problem that is an emergency. Do not wait to see if the symptoms will go away. Get medical help right away. Call your local emergency services (911 in the [place] Do not drive yourself to the hospital. This information is not intended to replace advice given to you by your health care provider. Make sure you discuss any questions you have with your health care provider. Document Released: 05/30/2002 Document Revised: 01/05/2017 Document Reviewed: 03/19/2015 Elsevier Interactive Patient Education  2019 [employer].

## 2018-08-26 ENCOUNTER — Other Ambulatory Visit: Payer: Self-pay | Admitting: Family

## 2018-08-26 DIAGNOSIS — J029 Acute pharyngitis, unspecified: Secondary | ICD-10-CM

## 2018-08-30 DIAGNOSIS — M25571 Pain in right ankle and joints of right foot: Secondary | ICD-10-CM | POA: Diagnosis not present

## 2018-08-30 DIAGNOSIS — M79661 Pain in right lower leg: Secondary | ICD-10-CM | POA: Diagnosis not present

## 2018-08-30 DIAGNOSIS — S8991XA Unspecified injury of right lower leg, initial encounter: Secondary | ICD-10-CM | POA: Diagnosis not present

## 2018-08-30 DIAGNOSIS — S99911A Unspecified injury of right ankle, initial encounter: Secondary | ICD-10-CM | POA: Diagnosis not present

## 2018-08-31 ENCOUNTER — Encounter: Payer: Self-pay | Admitting: Family

## 2018-08-31 ENCOUNTER — Ambulatory Visit: Payer: Commercial Managed Care - PPO | Admitting: Family

## 2018-08-31 VITALS — BP 117/79 | HR 109 | Temp 97.8°F | Ht 64.0 in

## 2018-08-31 DIAGNOSIS — Z09 Encounter for follow-up examination after completed treatment for conditions other than malignant neoplasm: Secondary | ICD-10-CM

## 2018-08-31 DIAGNOSIS — S93401A Sprain of unspecified ligament of right ankle, initial encounter: Secondary | ICD-10-CM

## 2018-08-31 DIAGNOSIS — S93401D Sprain of unspecified ligament of right ankle, subsequent encounter: Secondary | ICD-10-CM

## 2018-08-31 NOTE — Patient Instructions (Signed)
Ankle Sprain    An ankle sprain is a stretch or tear in a ligament in the ankle. Ligaments are tissues that connect bones to each other.  The two most common types of ankle sprains are:   Inversion sprain. This happens when the foot turns inward and the ankle rolls outward. It affects the ligament on the outside of the foot (lateral ligament).   Eversion sprain. This happens when the foot turns outward and the ankle rolls inward. It affects the ligament on the inner side of the foot (medial ligament).  What are the causes?  This condition is often caused by accidentally rolling or twisting the ankle.  What increases the risk?  You are more likely to develop this condition if you play sports.  What are the signs or symptoms?  Symptoms of this condition include:   Pain in your ankle.   Swelling.   Bruising. This may develop right after you sprain your ankle or 1-2 days later.   Trouble standing or walking, especially when you turn or change directions.  How is this diagnosed?  This condition is diagnosed with:   A physical exam. During the exam, your health care provider will press on certain parts of your foot and ankle and try to move them in certain ways.   X-ray imaging. These may be taken to see how severe the sprain is and to check for broken bones.  How is this treated?  This condition may be treated with:   A brace or splint. This is used to keep the ankle from moving until it heals.   An elastic bandage. This is used to support the ankle.   Crutches.   Pain medicine.   Surgery. This may be needed if the sprain is severe.   Physical therapy. This may help to improve the range of motion in the ankle.  Follow these instructions at home:  If you have a brace or a splint:   Wear the brace or splint as told by your health care provider. Remove it only as told by your health care provider.   Loosen the brace or splint if your toes tingle, become numb, or turn cold and blue.   Keep the brace or  splint clean.   If the brace or splint is not waterproof:  ? Do not let it get wet.  ? Cover it with a watertight covering when you take a bath or a shower.  If you have an elastic bandage (dressing):   Remove it to shower or bathe.   Try not to move your ankle much, but wiggle your toes from time to time. This helps to prevent swelling.   Adjust the dressing to make it more comfortable if it feels too tight.   Loosen the dressing if you have numbness or tingling in your foot, or if your foot becomes cold and blue.  Managing pain, stiffness, and swelling     Take over-the-counter and prescription medicines only as told by your health care provider.   For 2-3 days, keep your ankle raised (elevated) above the level of your heart as much as possible.   If directed, put ice on the injured area:  ? If you have a removable brace or splint, remove it as told by your health care provider.  ? Put ice in a plastic bag.  ? Place a towel between your skin and the bag.  ? Leave the ice on for 20 minutes, 2-3 times a day.    General instructions   Rest your ankle.   Do not use the injured limb to support your body weight until your health care provider says that you can. Use crutches as told by your health care provider.   Do not use any products that contain nicotine or tobacco, such as cigarettes, e-cigarettes, and chewing tobacco. If you need help quitting, ask your health care provider.   Keep all follow-up visits as told by your health care provider. This is important.  Contact a health care provider if:   You have rapidly increasing bruising or swelling.   Your pain is not relieved with medicine.  Get help right away if:   Your foot or toes become numb or blue.   You have severe pain that gets worse.  Summary   An ankle sprain is a stretch or tear in a ligament in the ankle. Ligaments are tissues that connect bones to each other.   This condition is often caused by accidentally rolling or twisting the  ankle.   Symptoms include pain, swelling, bruising, and trouble walking.   To relieve pain and swelling, put ice on the affected ankle, raise your ankle above the level of your heart, and use an elastic bandage.   Keep all follow-up visits as told by your health care provider. This is important.  This information is not intended to replace advice given to you by your health care provider. Make sure you discuss any questions you have with your health care provider.  Document Released: 06/09/2005 Document Revised: 03/02/2018 Document Reviewed: 11/03/2017  Elsevier Interactive Patient Education  2019 [employer]

## 2018-08-31 NOTE — Progress Notes (Signed)
   Subjective:    Patient ID: Dana Lane, female    DOB: [birth year], 23 y.o.   MRN: [id number]  Chief Complaint  Patient presents with  . Ankle Injury   Pt presents to the office today with ankle pain. She fell Saturday evening and went to the ED. She had a negative x-ray and told she had a severe sprain. She was discharged on naprosyn BID.  Ankle Injury   The incident occurred 2 days ago. The injury mechanism was a fall. The pain is present in the right ankle. The quality of the pain is described as aching. The pain is at a severity of 8/10. The pain is moderate. The pain has been constant since onset. Pertinent negatives include no numbness or tingling. She reports no foreign bodies present. The symptoms are aggravated by movement and weight bearing. She has tried acetaminophen, rest and non-weight bearing for the symptoms. The treatment provided mild relief.      Review of Systems  Neurological: Negative for tingling and numbness.  All other systems reviewed and are negative.      Objective:   Physical Exam Vitals signs reviewed.  Constitutional:      General: She is not in acute distress.    Appearance: She is well-developed.  HENT:     Head: Normocephalic and atraumatic.  Neck:     Musculoskeletal: Normal range of motion and neck supple.     Thyroid: No thyromegaly.  Cardiovascular:     Rate and Rhythm: Normal rate and regular rhythm.     Heart sounds: Normal heart sounds. No murmur.  Pulmonary:     Effort: Pulmonary effort is normal. No respiratory distress.     Breath sounds: Normal breath sounds. No wheezing.  Abdominal:     General: Bowel sounds are normal. There is no distension.     Palpations: Abdomen is soft.     Tenderness: There is no abdominal tenderness.  Musculoskeletal:        General: No tenderness.     Comments: 2+ swelling in lateral right ankle. Pain with flexion or extension  Skin:    General: Skin is warm and dry.  Neurological:     Mental  Status: She is alert and oriented to person, place, and time.     Cranial Nerves: No cranial nerve deficit.     Deep Tendon Reflexes: Reflexes are normal and symmetric.  Psychiatric:        Behavior: Behavior normal.        Thought Content: Thought content normal.        Judgment: Judgment normal.       BP 117/79   Pulse (!) 109   Temp 97.8 F (36.6 C) (Oral)   Ht 5\' 4"  (1.626 m)   BMI 33.20 kg/m      Assessment & Plan:  Chandni Dario comes in today with chief complaint of Ankle Injury   Diagnosis and orders addressed:  1. Sprain of right ankle, unspecified ligament, subsequent encounter Rest Ice Compression Elevation  Continue Naproxen BID with food Will write note to be out of work for next 2 weeks   2. Hospital discharge follow-up  Jannifer Rodney, FNP

## 2018-09-03 ENCOUNTER — Encounter: Payer: Self-pay | Admitting: Pulmonary Disease

## 2018-09-07 ENCOUNTER — Other Ambulatory Visit: Payer: Self-pay | Admitting: *Deleted

## 2018-09-07 DIAGNOSIS — F331 Major depressive disorder, recurrent, moderate: Secondary | ICD-10-CM

## 2018-09-07 DIAGNOSIS — K219 Gastro-esophageal reflux disease without esophagitis: Secondary | ICD-10-CM

## 2018-09-07 DIAGNOSIS — J069 Acute upper respiratory infection, unspecified: Secondary | ICD-10-CM

## 2018-09-07 MED ORDER — OMEPRAZOLE 40 MG PO CPDR: 40.0000 mg | DELAYED_RELEASE_CAPSULE | Freq: Every day | ORAL | 2 refills | Status: DC

## 2018-09-07 MED ORDER — FLUTICASONE PROPIONATE 50 MCG/ACT NA SUSP: 2.0000 | Freq: Every day | NASAL | 2 refills | Status: DC

## 2018-09-07 MED ORDER — VENLAFAXINE HCL ER 75 MG PO CP24: 75.0000 mg | ORAL_CAPSULE | Freq: Every day | ORAL | 1 refills | Status: DC

## 2018-09-07 NOTE — Addendum Note (Signed)
Addended by: Julious Payer D on: 09/07/2018 12:15 PM   Modules accepted: Orders

## 2018-09-10 ENCOUNTER — Other Ambulatory Visit: Payer: Self-pay | Admitting: Family

## 2018-09-10 MED ORDER — DICLOFENAC SODIUM 75 MG PO TBEC: 75.0000 mg | DELAYED_RELEASE_TABLET | Freq: Two times a day (BID) | ORAL | 2 refills | Status: DC

## 2018-09-13 ENCOUNTER — Encounter: Payer: Self-pay | Admitting: Pulmonary Disease

## 2018-09-14 ENCOUNTER — Telehealth: Payer: Self-pay | Admitting: Pulmonary Disease

## 2018-09-14 ENCOUNTER — Telehealth: Payer: Self-pay | Admitting: Family

## 2018-09-14 NOTE — Telephone Encounter (Signed)
Pt needs a work note stating all work restrictions. Employer is [employer]. Please fax to [phone]

## 2018-09-14 NOTE — Telephone Encounter (Signed)
done

## 2018-09-14 NOTE — Telephone Encounter (Signed)
Letter printed / signed and re-faxed

## 2018-09-14 NOTE — Telephone Encounter (Signed)
I attempted to return pt's call.  No ans & no option to LM.  Pt will be contacted with a new appointment once HST are being scheduled again.

## 2018-09-15 NOTE — Telephone Encounter (Signed)
Pt returned my call.  Cancelled appointment & we will contact

## 2018-09-17 ENCOUNTER — Telehealth: Payer: Self-pay | Admitting: *Deleted

## 2018-09-17 ENCOUNTER — Telehealth: Payer: Self-pay | Admitting: Family

## 2018-09-17 NOTE — Telephone Encounter (Signed)
Patient aware,   form with work restrictions was faxed today to [phone].   Paper will be sent to be scanned in to patient's chart.

## 2018-09-17 NOTE — Telephone Encounter (Signed)
Paper completed

## 2018-09-17 NOTE — Telephone Encounter (Signed)
Please review and advise.

## 2018-09-20 ENCOUNTER — Telehealth: Payer: Self-pay | Admitting: Family

## 2018-09-21 NOTE — Telephone Encounter (Signed)
Faxed by nursing staff on 09/17/18 Copy given to patient on 09/21/18

## 2018-09-30 ENCOUNTER — Ambulatory Visit: Payer: Commercial Managed Care - PPO | Admitting: Family

## 2018-10-15 ENCOUNTER — Encounter: Payer: Self-pay | Admitting: Pulmonary Disease

## 2018-11-01 ENCOUNTER — Ambulatory Visit (INDEPENDENT_AMBULATORY_CARE_PROVIDER_SITE_OTHER): Payer: Commercial Managed Care - PPO | Admitting: Otolaryngology

## 2018-11-01 ENCOUNTER — Other Ambulatory Visit: Payer: Self-pay

## 2018-11-01 ENCOUNTER — Other Ambulatory Visit: Payer: Self-pay | Admitting: Otolaryngology

## 2018-11-01 DIAGNOSIS — J3503 Chronic tonsillitis and adenoiditis: Secondary | ICD-10-CM | POA: Diagnosis not present

## 2018-11-01 DIAGNOSIS — J353 Hypertrophy of tonsils with hypertrophy of adenoids: Secondary | ICD-10-CM | POA: Diagnosis not present

## 2018-11-05 ENCOUNTER — Other Ambulatory Visit: Payer: Self-pay

## 2018-11-05 ENCOUNTER — Ambulatory Visit: Payer: Commercial Managed Care - PPO

## 2018-11-05 DIAGNOSIS — G478 Other sleep disorders: Secondary | ICD-10-CM

## 2018-11-07 DIAGNOSIS — G4733 Obstructive sleep apnea (adult) (pediatric): Secondary | ICD-10-CM

## 2018-11-08 ENCOUNTER — Telehealth: Payer: Self-pay | Admitting: Pulmonary Disease

## 2018-11-08 NOTE — Telephone Encounter (Signed)
Wrote excuse letter this morning for pt having HST appt on 11/05/18 for her work Hand delivered the letter to pt in her car, and placed copy in her mychart Pt verbalized understanding Nothing further needed

## 2018-11-10 ENCOUNTER — Encounter (HOSPITAL_BASED_OUTPATIENT_CLINIC_OR_DEPARTMENT_OTHER): Payer: Self-pay | Admitting: *Deleted

## 2018-11-10 ENCOUNTER — Other Ambulatory Visit: Payer: Self-pay

## 2018-11-10 DIAGNOSIS — G4733 Obstructive sleep apnea (adult) (pediatric): Secondary | ICD-10-CM

## 2018-11-11 ENCOUNTER — Ambulatory Visit: Payer: Commercial Managed Care - PPO | Admitting: Family

## 2018-11-11 ENCOUNTER — Encounter: Payer: Self-pay | Admitting: Family

## 2018-11-11 VITALS — BP 118/80 | HR 93 | Temp 97.8°F | Ht 64.5 in | Wt 206.4 lb

## 2018-11-11 DIAGNOSIS — S93401S Sprain of unspecified ligament of right ankle, sequela: Secondary | ICD-10-CM

## 2018-11-11 DIAGNOSIS — S93401D Sprain of unspecified ligament of right ankle, subsequent encounter: Secondary | ICD-10-CM | POA: Diagnosis not present

## 2018-11-11 NOTE — Patient Instructions (Signed)
Ankle Sprain    An ankle sprain is a stretch or tear in a ligament in the ankle. Ligaments are tissues that connect bones to each other.  The two most common types of ankle sprains are:   Inversion sprain. This happens when the foot turns inward and the ankle rolls outward. It affects the ligament on the outside of the foot (lateral ligament).   Eversion sprain. This happens when the foot turns outward and the ankle rolls inward. It affects the ligament on the inner side of the foot (medial ligament).  What are the causes?  This condition is often caused by accidentally rolling or twisting the ankle.  What increases the risk?  You are more likely to develop this condition if you play sports.  What are the signs or symptoms?  Symptoms of this condition include:   Pain in your ankle.   Swelling.   Bruising. This may develop right after you sprain your ankle or 1-2 days later.   Trouble standing or walking, especially when you turn or change directions.  How is this diagnosed?  This condition is diagnosed with:   A physical exam. During the exam, your health care provider will press on certain parts of your foot and ankle and try to move them in certain ways.   X-ray imaging. These may be taken to see how severe the sprain is and to check for broken bones.  How is this treated?  This condition may be treated with:   A brace or splint. This is used to keep the ankle from moving until it heals.   An elastic bandage. This is used to support the ankle.   Crutches.   Pain medicine.   Surgery. This may be needed if the sprain is severe.   Physical therapy. This may help to improve the range of motion in the ankle.  Follow these instructions at home:  If you have a brace or a splint:   Wear the brace or splint as told by your health care provider. Remove it only as told by your health care provider.   Loosen the brace or splint if your toes tingle, become numb, or turn cold and blue.   Keep the brace or  splint clean.   If the brace or splint is not waterproof:  ? Do not let it get wet.  ? Cover it with a watertight covering when you take a bath or a shower.  If you have an elastic bandage (dressing):   Remove it to shower or bathe.   Try not to move your ankle much, but wiggle your toes from time to time. This helps to prevent swelling.   Adjust the dressing to make it more comfortable if it feels too tight.   Loosen the dressing if you have numbness or tingling in your foot, or if your foot becomes cold and blue.  Managing pain, stiffness, and swelling     Take over-the-counter and prescription medicines only as told by your health care provider.   For 2-3 days, keep your ankle raised (elevated) above the level of your heart as much as possible.   If directed, put ice on the injured area:  ? If you have a removable brace or splint, remove it as told by your health care provider.  ? Put ice in a plastic bag.  ? Place a towel between your skin and the bag.  ? Leave the ice on for 20 minutes, 2-3 times a day.    General instructions   Rest your ankle.   Do not use the injured limb to support your body weight until your health care provider says that you can. Use crutches as told by your health care provider.   Do not use any products that contain nicotine or tobacco, such as cigarettes, e-cigarettes, and chewing tobacco. If you need help quitting, ask your health care provider.   Keep all follow-up visits as told by your health care provider. This is important.  Contact a health care provider if:   You have rapidly increasing bruising or swelling.   Your pain is not relieved with medicine.  Get help right away if:   Your foot or toes become numb or blue.   You have severe pain that gets worse.  Summary   An ankle sprain is a stretch or tear in a ligament in the ankle. Ligaments are tissues that connect bones to each other.   This condition is often caused by accidentally rolling or twisting the  ankle.   Symptoms include pain, swelling, bruising, and trouble walking.   To relieve pain and swelling, put ice on the affected ankle, raise your ankle above the level of your heart, and use an elastic bandage.   Keep all follow-up visits as told by your health care provider. This is important.  This information is not intended to replace advice given to you by your health care provider. Make sure you discuss any questions you have with your health care provider.  Document Released: 06/09/2005 Document Revised: 03/02/2018 Document Reviewed: 11/03/2017  Elsevier Interactive Patient Education  2019 [employer]

## 2018-11-11 NOTE — Progress Notes (Signed)
Subjective:    Patient ID: Dana Lane Dehaan, female    DOB: Jun 30, 1995, 23 y.o.   MRN: [id number]  Chief Complaint  Patient presents with  . recheck ankle   PT presents to the office today to recheck right ankle sprain. PT fell on 08/28/18 and rolled her right foot. She had a negative x-ray in the hospital. She has been wearing a boot and taking diclofenac BID. She reports the pain is a "little better". She states she is having 6 out 10 pain in her ankle. She states she has pain when she walks or puts pressure on it.   She reports she has continued wearing a boot.  Ankle Pain   The incident occurred more than 1 week ago. The injury mechanism was a fall. The pain is present in the right ankle. The quality of the pain is described as aching. The pain is at a severity of 6/10. The pain is moderate. The pain has been constant since onset. Associated symptoms include an inability to bear weight. She has tried NSAIDs and rest for the symptoms. The treatment provided mild relief.      Review of Systems  All other systems reviewed and are negative.      Objective:   Physical Exam Vitals signs reviewed.  Constitutional:      General: She is not in acute distress.    Appearance: She is well-developed.  HENT:     Head: Normocephalic and atraumatic.  Eyes:     Pupils: Pupils are equal, round, and reactive to light.  Neck:     Musculoskeletal: Normal range of motion and neck supple.     Thyroid: No thyromegaly.  Cardiovascular:     Rate and Rhythm: Normal rate and regular rhythm.     Heart sounds: Normal heart sounds. No murmur.  Pulmonary:     Effort: Pulmonary effort is normal. No respiratory distress.     Breath sounds: Normal breath sounds. No wheezing.  Abdominal:     General: Bowel sounds are normal. There is no distension.     Palpations: Abdomen is soft.     Tenderness: There is no abdominal tenderness.  Musculoskeletal:        General: Tenderness present.     Comments: Full  ROM of right ankle, no swelling noted. Pain with flexion and extension   Skin:    General: Skin is warm and dry.  Neurological:     Mental Status: She is alert and oriented to person, place, and time.     Cranial Nerves: No cranial nerve deficit.     Deep Tendon Reflexes: Reflexes are normal and symmetric.  Psychiatric:        Behavior: Behavior normal.        Thought Content: Thought content normal.        Judgment: Judgment normal.      BP 118/80   Pulse 93   Temp 97.8 F (36.6 C) (Oral)   Ht 5' 4.5" (1.638 m)   Wt 206 lb 6.4 oz (93.6 kg)   LMP 10/26/2018 (Exact Date)   BMI 34.88 kg/m       Assessment & Plan:  Dana Lane Calvo comes in today with chief complaint of recheck ankle   Diagnosis and orders addressed:  1. Sprain of right ankle, unspecified ligament, sequela Take boot off- Start walking and doing ROM exercises every day!!! Will do referral to PT Continue NSAID Will write note for work for 2 more week light duty then  return to regular duties  RTO if symptoms do not improve  - Ambulatory referral to Physical Therapy   Jannifer Rodney, FNP

## 2018-11-12 ENCOUNTER — Other Ambulatory Visit (HOSPITAL_COMMUNITY)
Admission: RE | Admit: 2018-11-12 | Discharge: 2018-11-12 | Disposition: A | Discharge status: Home / Self Care | Payer: Commercial Managed Care - PPO | Source: Ambulatory Visit | Attending: Otolaryngology | Admitting: Otolaryngology

## 2018-11-12 ENCOUNTER — Encounter (HOSPITAL_BASED_OUTPATIENT_CLINIC_OR_DEPARTMENT_OTHER)
Admission: RE | Admit: 2018-11-12 | Discharge: 2018-11-12 | Disposition: A | Discharge status: Home / Self Care | Payer: Commercial Managed Care - PPO | Source: Ambulatory Visit | Attending: Otolaryngology | Admitting: Otolaryngology

## 2018-11-12 ENCOUNTER — Other Ambulatory Visit: Payer: Self-pay

## 2018-11-12 DIAGNOSIS — Z01812 Encounter for preprocedural laboratory examination: Secondary | ICD-10-CM | POA: Diagnosis present

## 2018-11-12 DIAGNOSIS — Z1159 Encounter for screening for other viral diseases: Secondary | ICD-10-CM | POA: Insufficient documentation

## 2018-11-12 LAB — POCT PREGNANCY, URINE: Preg Test, Ur: NEGATIVE

## 2018-11-13 LAB — NOVEL CORONAVIRUS, NAA (HOSP ORDER, SEND-OUT TO REF LAB; TAT 18-24 HRS): SARS-CoV-2, NAA: NOT DETECTED

## 2018-11-16 ENCOUNTER — Other Ambulatory Visit: Payer: Self-pay

## 2018-11-16 ENCOUNTER — Ambulatory Visit (HOSPITAL_BASED_OUTPATIENT_CLINIC_OR_DEPARTMENT_OTHER): Payer: Commercial Managed Care - PPO | Admitting: Anesthesiology

## 2018-11-16 ENCOUNTER — Ambulatory Visit (HOSPITAL_BASED_OUTPATIENT_CLINIC_OR_DEPARTMENT_OTHER)
Admission: RE | Admit: 2018-11-16 | Discharge: 2018-11-16 | Disposition: A | Discharge status: Home / Self Care | Payer: Commercial Managed Care - PPO | Attending: Otolaryngology | Admitting: Otolaryngology

## 2018-11-16 ENCOUNTER — Encounter (HOSPITAL_BASED_OUTPATIENT_CLINIC_OR_DEPARTMENT_OTHER)
Admission: RE | Disposition: A | Discharge status: Home / Self Care | Payer: Self-pay | Source: Home / Self Care | Attending: Otolaryngology

## 2018-11-16 ENCOUNTER — Encounter (HOSPITAL_BASED_OUTPATIENT_CLINIC_OR_DEPARTMENT_OTHER): Payer: Self-pay | Admitting: Anesthesiology

## 2018-11-16 DIAGNOSIS — J3501 Chronic tonsillitis: Secondary | ICD-10-CM | POA: Diagnosis not present

## 2018-11-16 DIAGNOSIS — J312 Chronic pharyngitis: Secondary | ICD-10-CM | POA: Insufficient documentation

## 2018-11-16 DIAGNOSIS — J353 Hypertrophy of tonsils with hypertrophy of adenoids: Secondary | ICD-10-CM | POA: Diagnosis not present

## 2018-11-16 DIAGNOSIS — K219 Gastro-esophageal reflux disease without esophagitis: Secondary | ICD-10-CM | POA: Insufficient documentation

## 2018-11-16 HISTORY — DX: Depression, unspecified: F32.A

## 2018-11-16 HISTORY — DX: Gastro-esophageal reflux disease without esophagitis: K21.9

## 2018-11-16 HISTORY — PX: TONSILLECTOMY AND ADENOIDECTOMY: SHX28

## 2018-11-16 HISTORY — DX: Sprain of unspecified ligament of unspecified ankle, initial encounter: S93.409A

## 2018-11-16 SURGERY — TONSILLECTOMY AND ADENOIDECTOMY
Anesthesia: General | Site: Throat | Laterality: Bilateral

## 2018-11-16 MED ORDER — MIDAZOLAM HCL 2 MG/2ML IJ SOLN
1.0000 mg | INTRAMUSCULAR | Status: DC | PRN
  Administered 2018-11-16: 2 mg via INTRAVENOUS

## 2018-11-16 MED ORDER — OXYCODONE HCL 5 MG/5ML PO SOLN: 5.0000 mg | ORAL | 0 refills | Status: AC | PRN

## 2018-11-16 MED ORDER — LACTATED RINGERS IV SOLN: INTRAVENOUS | Status: DC

## 2018-11-16 MED ORDER — OXYCODONE HCL 5 MG PO TABS
ORAL_TABLET | ORAL | Status: AC
  Filled 2018-11-16: qty 1

## 2018-11-16 MED ORDER — METOCLOPRAMIDE HCL 5 MG/ML IJ SOLN: 10.0000 mg | Freq: Once | INTRAMUSCULAR | Status: DC | PRN

## 2018-11-16 MED ORDER — SUCCINYLCHOLINE CHLORIDE 20 MG/ML IJ SOLN
INTRAMUSCULAR | Status: DC | PRN
  Administered 2018-11-16: 120 mg via INTRAVENOUS

## 2018-11-16 MED ORDER — DEXAMETHASONE SODIUM PHOSPHATE 10 MG/ML IJ SOLN
INTRAMUSCULAR | Status: AC
  Filled 2018-11-16: qty 1

## 2018-11-16 MED ORDER — FENTANYL CITRATE (PF) 100 MCG/2ML IJ SOLN
50.0000 ug | INTRAMUSCULAR | Status: DC | PRN
  Administered 2018-11-16 (×2): 50 ug via INTRAVENOUS

## 2018-11-16 MED ORDER — FENTANYL CITRATE (PF) 100 MCG/2ML IJ SOLN
25.0000 ug | INTRAMUSCULAR | Status: DC | PRN
  Administered 2018-11-16: 11:00:00 50 ug via INTRAVENOUS
  Administered 2018-11-16 (×3): 25 ug via INTRAVENOUS

## 2018-11-16 MED ORDER — ONDANSETRON HCL 4 MG/2ML IJ SOLN
INTRAMUSCULAR | Status: DC | PRN
  Administered 2018-11-16: 4 mg via INTRAVENOUS

## 2018-11-16 MED ORDER — AMOXICILLIN 400 MG/5ML PO SUSR: 800.0000 mg | Freq: Two times a day (BID) | ORAL | 0 refills | Status: AC

## 2018-11-16 MED ORDER — FENTANYL CITRATE (PF) 100 MCG/2ML IJ SOLN
INTRAMUSCULAR | Status: AC
  Filled 2018-11-16: qty 2

## 2018-11-16 MED ORDER — IBUPROFEN 100 MG/5ML PO SUSP
ORAL | Status: AC
  Filled 2018-11-16: qty 30

## 2018-11-16 MED ORDER — MEPERIDINE HCL 25 MG/ML IJ SOLN: 6.2500 mg | INTRAMUSCULAR | Status: DC | PRN

## 2018-11-16 MED ORDER — MIDAZOLAM HCL 2 MG/2ML IJ SOLN
INTRAMUSCULAR | Status: AC
  Filled 2018-11-16: qty 2

## 2018-11-16 MED ORDER — ONDANSETRON HCL 4 MG/2ML IJ SOLN
INTRAMUSCULAR | Status: AC
  Filled 2018-11-16: qty 2

## 2018-11-16 MED ORDER — SUCCINYLCHOLINE CHLORIDE 200 MG/10ML IV SOSY
PREFILLED_SYRINGE | INTRAVENOUS | Status: AC
  Filled 2018-11-16: qty 10

## 2018-11-16 MED ORDER — IBUPROFEN 100 MG/5ML PO SUSP
600.0000 mg | Freq: Once | ORAL | Status: AC
  Administered 2018-11-16: 600 mg via ORAL

## 2018-11-16 MED ORDER — LIDOCAINE 2% (20 MG/ML) 5 ML SYRINGE
INTRAMUSCULAR | Status: DC | PRN
  Administered 2018-11-16: 60 mg via INTRAVENOUS

## 2018-11-16 MED ORDER — LACTATED RINGERS IV SOLN
INTRAVENOUS | Status: DC
  Administered 2018-11-16: 09:00:00 via INTRAVENOUS

## 2018-11-16 MED ORDER — OXYCODONE HCL 5 MG PO TABS
5.0000 mg | ORAL_TABLET | Freq: Once | ORAL | Status: AC
  Administered 2018-11-16: 12:00:00 5 mg via ORAL

## 2018-11-16 MED ORDER — DEXAMETHASONE SODIUM PHOSPHATE 4 MG/ML IJ SOLN
INTRAMUSCULAR | Status: DC | PRN
  Administered 2018-11-16: 10 mg via INTRAVENOUS

## 2018-11-16 MED ORDER — OXYMETAZOLINE HCL 0.05 % NA SOLN
NASAL | Status: AC
  Filled 2018-11-16: qty 90

## 2018-11-16 MED ORDER — LIDOCAINE-EPINEPHRINE 1 %-1:100000 IJ SOLN
INTRAMUSCULAR | Status: AC
  Filled 2018-11-16: qty 1

## 2018-11-16 MED ORDER — OXYMETAZOLINE HCL 0.05 % NA SOLN
NASAL | Status: DC | PRN
  Administered 2018-11-16: 1 via TOPICAL

## 2018-11-16 MED ORDER — CIPROFLOXACIN-FLUOCINOLONE PF 0.3-0.025 % OT SOLN
OTIC | Status: AC
  Filled 2018-11-16: qty 1.5

## 2018-11-16 MED ORDER — LIDOCAINE 2% (20 MG/ML) 5 ML SYRINGE
INTRAMUSCULAR | Status: AC
  Filled 2018-11-16: qty 5

## 2018-11-16 MED ORDER — SCOPOLAMINE 1 MG/3DAYS TD PT72: 1.0000 | MEDICATED_PATCH | Freq: Once | TRANSDERMAL | Status: DC | PRN

## 2018-11-16 MED ORDER — PROPOFOL 10 MG/ML IV BOLUS
INTRAVENOUS | Status: DC | PRN
  Administered 2018-11-16: 150 mg via INTRAVENOUS

## 2018-11-16 SURGICAL SUPPLY — 30 items
BNDG COHESIVE 2X5 TAN STRL LF (GAUZE/BANDAGES/DRESSINGS) IMPLANT
CANISTER SUCT 1200ML W/VALVE (MISCELLANEOUS) ×2 IMPLANT
CATH ROBINSON RED A/P 10FR (CATHETERS) IMPLANT
CATH ROBINSON RED A/P 14FR (CATHETERS) ×1 IMPLANT
COAGULATOR SUCT SWTCH 10FR 6 (ELECTROSURGICAL) IMPLANT
COVER BACK TABLE REUSABLE LG (DRAPES) ×2 IMPLANT
COVER MAYO STAND REUSABLE (DRAPES) ×2 IMPLANT
COVER WAND RF STERILE (DRAPES) IMPLANT
ELECT REM PT RETURN 9FT ADLT (ELECTROSURGICAL) ×2
ELECT REM PT RETURN 9FT PED (ELECTROSURGICAL)
ELECTRODE REM PT RETRN 9FT PED (ELECTROSURGICAL) IMPLANT
ELECTRODE REM PT RTRN 9FT ADLT (ELECTROSURGICAL) IMPLANT
GAUZE SPONGE 4X4 12PLY STRL LF (GAUZE/BANDAGES/DRESSINGS) ×2 IMPLANT
GLOVE BIO SURGEON STRL SZ7 (GLOVE) ×1 IMPLANT
GLOVE BIO SURGEON STRL SZ7.5 (GLOVE) ×2 IMPLANT
GOWN STRL REUS W/ TWL LRG LVL3 (GOWN DISPOSABLE) ×2 IMPLANT
GOWN STRL REUS W/TWL LRG LVL3 (GOWN DISPOSABLE) ×2
IV NS 500ML (IV SOLUTION) ×1
IV NS 500ML BAXH (IV SOLUTION) ×1 IMPLANT
MARKER SKIN DUAL TIP RULER LAB (MISCELLANEOUS) IMPLANT
NS IRRIG 1000ML POUR BTL (IV SOLUTION) ×1 IMPLANT
SHEET MEDIUM DRAPE 40X70 STRL (DRAPES) ×2 IMPLANT
SOLUTION BUTLER CLEAR DIP (MISCELLANEOUS) ×2 IMPLANT
SPONGE TONSIL TAPE 1.25 RFD (DISPOSABLE) ×2 IMPLANT
SYR BULB 3OZ (MISCELLANEOUS) IMPLANT
TOWEL GREEN STERILE FF (TOWEL DISPOSABLE) ×2 IMPLANT
TUBE CONNECTING 20X1/4 (TUBING) ×2 IMPLANT
TUBE SALEM SUMP 12R W/ARV (TUBING) IMPLANT
TUBE SALEM SUMP 16 FR W/ARV (TUBING) ×1 IMPLANT
WAND COBLATOR 70 EVAC XTRA (SURGICAL WAND) ×2 IMPLANT

## 2018-11-16 NOTE — Op Note (Signed)
DATE OF PROCEDURE:  11/16/2018                              OPERATIVE REPORT  SURGEON:  Newman Pies, MD  PREOPERATIVE DIAGNOSES: 1. Adenotonsillar hypertrophy. 2. Chronic tonsillitis and pharyngitis  POSTOPERATIVE DIAGNOSES: 1. Adenotonsillar hypertrophy. 2. Chronic tonsillitis and pharyngitis  PROCEDURE PERFORMED:  Adenotonsillectomy.  ANESTHESIA:  General endotracheal tube anesthesia.  COMPLICATIONS:  None.  ESTIMATED BLOOD LOSS:  Minimal.  INDICATION FOR PROCEDURE:  Dana Lane is a 23 y.o. female with a history of chronic tonsillitis/pharyngitis and halitosis.  According to the patient, she has been experiencing chronic throat discomfort with halitosis for several years. The patient continues to be symptomatic despite medical treatments. On examination, the patient was noted to have bilateral cryptic tonsils, with numerous tonsilloliths. Based on the above findings, the decision was made for the patient to undergo the adenotonsillectomy procedure. Likelihood of success in reducing symptoms was also discussed.  The risks, benefits, alternatives, and details of the procedure were discussed with the patient.  Questions were invited and answered.  Informed consent was obtained.  DESCRIPTION:  The patient was taken to the operating room and placed supine on the operating table.  General endotracheal tube anesthesia was administered by the anesthesiologist.  The patient was positioned and prepped and draped in a standard fashion for adenotonsillectomy.  A Crowe-Davis mouth gag was inserted into the oral cavity for exposure. 2+ cryptic tonsils were noted bilaterally.  No bifidity was noted.  Indirect mirror examination of the nasopharynx revealed moderate adenoid hypertrophy. The adenoid was ablated with the Coblator device. Hemostasis was achieved with the Coblator device.  The right tonsil was then grasped with a straight Allis clamp and retracted medially.  It was resected free from the underlying  pharyngeal constrictor muscles with the Coblator device.  The same procedure was repeated on the left side without exception.  The surgical sites were copiously irrigated.  The mouth gag was removed.  The care of the patient was turned over to the anesthesiologist.  The patient was awakened from anesthesia without difficulty.  The patient was extubated and transferred to the recovery room in good condition.  OPERATIVE FINDINGS:  Adenotonsillar hypertrophy.  SPECIMEN:  None.  FOLLOWUP CARE:  The patient will be discharged home once awake and alert.  She will be placed on amoxicillin 800 mg p.o. b.i.d. for 5 days, and oxycodone 5-59ml po q 4 hours for postop pain control.   The patient will follow up in my office in approximately 2 weeks.  Su W Teoh 11/16/2018 10:05 AM

## 2018-11-16 NOTE — Discharge Instructions (Addendum)
SU [facility]OOI TE[facility] M.D., P.A[facility] Postoperative Instructions for Tonsillectomy & Adenoidectomy (T&A) Activity Restrict activity at home for the first two days, resting as much as possible. Light indoor activity is best. You may usually return to school or work within a week but void strenuous activity and sports for two weeks. Sleep with your head elevated on 2-3 pillows for 3-4 days to help decrease swelling. Diet Due to tissue swelling and throat discomfort, you may have little desire to drink for several days. However fluids are very important to prevent dehydration. You will find that non-acidic juices, soups, popsicles, Jell-O, custard, puddings, and any soft or mashed foods taken in small quantities can be swallowed fairly easily. Try to increase your fluid and food intake as the discomfort subsides. It is recommended that a child receive 1-1/2 quarts of fluid in a 24-hour period. Adult require twice this amount.  Discomfort Your sore throat may be relieved by applying an ice collar to your neck and/or by taking Tylenol. You may experience an earache, which is due to referred pain from the throat. Referred ear pain is commonly felt at night when trying to rest.  Bleeding                        Although rare, there is risk of having some bleeding during the first 2 weeks after having a T&A. This usually happens between days 7-10 postoperatively. If you or your child should have any bleeding, try to remain calm. We recommend sitting up quietly in a chair and gently spitting out the blood into a bowl. For adults, gargling gently with ice water may help. If the bleeding does not stop after a short time (5 minutes), is more than 1 teaspoonful, or if you become worried, please call our office at [phone] or go directly to the nearest hospital emergency room. Do not eat or drink anything prior to going to the hospital as you may need to be taken to the operating room in order to control the bleeding. GENERAL  CONSIDERATIONS 1. Brush your teeth regularly. Avoid mouthwashes and gargles for three weeks. You may gargle gently with warm salt-water as necessary or spray with Chloraseptic. You may make salt-water by placing 2 teaspoons of table salt into a quart of fresh water. Warm the salt-water in a microwave to a luke warm temperature.  2. Avoid exposure to colds and upper respiratory infections if possible.  3. If you look into a mirror or into your child's mouth, you will see white-gray patches in the back of the throat. This is normal after having a T&A and is like a scab that forms on the skin after an abrasion. It will disappear once the back of the throat heals completely. However, it may cause a noticeable odor; this too will disappear with time. Again, warm salt-water gargles may be used to help keep the throat clean and promote healing.  4. You may notice a temporary change in voice quality, such as a higher pitched voice or a nasal sound, until healing is complete. This may last for 1-2 weeks and should resolve.  5. Do not take or give you child any medications that we have not prescribed or recommended.  6. Snoring may occur, especially at night, for the first week after a T&A. It is due to swelling of the soft palate and will usually resolve.  Please call our office at [phone] if you have any questions.       Post Anesthesia Home Care Instructions  Activity: Get plenty of rest for the remainder of the day. A responsible individual must stay with you for 24 hours following the procedure.  For the next 24 hours, DO NOT: -Drive a car -Advertising copywriter -Drink alcoholic beverages -Take any medication unless instructed by your physician -Make any legal decisions or sign important papers.  Meals: Start with liquid foods such as gelatin or soup. Progress to regular foods as tolerated. Avoid greasy, spicy, heavy foods. If nausea and/or vomiting occur, drink only clear liquids until the nausea  and/or vomiting subsides. Call your physician if vomiting continues.  Special Instructions/Symptoms: Your throat may feel dry or sore from the anesthesia or the breathing tube placed in your throat during surgery. If this causes discomfort, gargle with warm salt water. The discomfort should disappear within 24 hours.  If you had a scopolamine patch placed behind your ear for the management of post- operative nausea and/or vomiting:  1. The medication in the patch is effective for 72 hours, after which it should be removed.  Wrap patch in a tissue and discard in the trash. Wash hands thoroughly with soap and water. 2. You may remove the patch earlier than 72 hours if you experience unpleasant side effects which may include dry mouth, dizziness or visual disturbances. 3. Avoid touching the patch. Wash your hands with soap and water after contact with the patch.     No ibuprofen until 7pm

## 2018-11-16 NOTE — H&P (Signed)
Cc: Chronic sore throat and tonsillitis  HPI: The patient is a 23 year old female who presents today complaining of chronic sore throat and tonsillitis.  The patient is seen in consultation requested by [facility].  According to the patient, she has been symptomatic for 3+ years.  Over the past year, she has had 2 to 3 episodes of sore throat every month.  She was diagnosed with multiple strep infections and tonsillitis.  She was treated with multiple courses of antibiotics. She complains that her tonsils are chronically enlarged.  She also reports occasional odynophagia.  She has no previous history of ENT surgery.    The patient's review of systems (constitutional, eyes, ENT, cardiovascular, respiratory, GI, musculoskeletal, skin, neurologic, psychiatric, endocrine, hematologic, allergic) is noted in the ROS questionnaire.  It is reviewed with the patient.  Family health history: Diabetes.  Major events: Eye surgery.  Ongoing medical problems: Hypertension, reflux, anxiety, allergies.  Social history: The patient is single. She denies the use of tobacco or illegal drugs. She drinks alcohol on occasions.    Exam General: Communicates without difficulty, well nourished, no acute distress. Head: Normocephalic, no evidence injury, no tenderness, facial buttresses intact without stepoff. Face/sinus: No tenderness to palpation and percussion. Facial movement is normal and symmetric. Eyes: PERRL, EOMI. No scleral icterus, conjunctivae clear. Neuro: CN II exam reveals vision grossly intact.  No nystagmus at any point of gaze. Ears: Auricles well formed without lesions.  Ear canals are intact without mass or lesion.  No erythema or edema is appreciated.  The TMs are intact without fluid. Nose: External evaluation reveals normal support and skin without lesions.  Dorsum is intact.  Anterior rhinoscopy reveals pink mucosa over anterior aspect of inferior turbinates and intact septum.  No  purulence noted. Oral:  Oral cavity and oropharynx are intact, symmetric.  Mucosa is moist without lesions. 2+ cryptic tonsils bilaterally. Neck: Full range of motion without pain.  There is no significant lymphadenopathy.  No masses palpable.  Thyroid bed within normal limits to palpation.  Parotid glands and submandibular glands equal bilaterally without mass.  Trachea is midline. Neuro:  CN 2-12 grossly intact. Gait normal.   Assessment 1.  The patient's history is consistent with chronic tonsillitis/pharyngitis, secondary to adenotonsillar hypertrophy. The patient is noted to have 2+ cryptic tonsils bilaterally.  No acute infection is noted today.  2.  The rest of her ENT exam is normal.   Plan  1.  The physical exam findings are reviewed with the patient.  2.  Based on the above findings, the patient is a candidate for the adenotonsillectomy procedure.  The risks, benefits, alternatives and details of the procedure are reviewed with the patient.  3.  She would like to proceed with the procedure.

## 2018-11-16 NOTE — Transfer of Care (Signed)
Immediate Anesthesia Transfer of Care Note  Patient: Dana Lane  Procedure(s) Performed: TONSILLECTOMY AND ADENOIDECTOMY (Bilateral Throat)  Patient Location: PACU  Anesthesia Type:General  Level of Consciousness: drowsy  Airway & Oxygen Therapy: Patient Spontanous Breathing and Patient connected to face mask oxygen  Post-op Assessment: Report given to RN and Post -op Vital signs reviewed and stable  Post vital signs: Reviewed and stable  Last Vitals:  Vitals Value Taken Time  BP 147/104 11/16/2018 10:07 AM  Temp    Pulse 92 11/16/2018 10:07 AM  Resp 18 11/16/2018 10:07 AM  SpO2 98 % 11/16/2018 10:07 AM  Vitals shown include unvalidated device data.  Last Pain:  Vitals:   11/16/18 0824  TempSrc: Oral  PainSc: 5       Patients Stated Pain Goal: 3 (11/16/18 8756)  Complications: No apparent anesthesia complications

## 2018-11-16 NOTE — Anesthesia Preprocedure Evaluation (Signed)
Anesthesia Evaluation  Patient identified by MRN, date of birth, ID band Patient awake    Reviewed: Allergy & Precautions, NPO status , Patient's Chart, lab work & pertinent test results  Airway Mallampati: II  TM Distance: >3 FB Neck ROM: Full    Dental no notable dental hx.    Pulmonary neg pulmonary ROS,    Pulmonary exam normal breath sounds clear to auscultation       Cardiovascular negative cardio ROS Normal cardiovascular exam Rhythm:Regular Rate:Normal     Neuro/Psych negative neurological ROS  negative psych ROS   GI/Hepatic Neg liver ROS, GERD  Medicated and Controlled,  Endo/Other  negative endocrine ROS  Renal/GU negative Renal ROS  negative genitourinary   Musculoskeletal negative musculoskeletal ROS (+)   Abdominal   Peds negative pediatric ROS (+)  Hematology negative hematology ROS (+)   Anesthesia Other Findings   Reproductive/Obstetrics negative OB ROS                             Anesthesia Physical Anesthesia Plan  ASA: II  Anesthesia Plan: General   Post-op Pain Management:    Induction: Intravenous  PONV Risk Score and Plan: 4 or greater and Ondansetron, Dexamethasone, Midazolam, Scopolamine patch - Pre-op and Treatment may vary due to age or medical condition  Airway Management Planned: Oral ETT  Additional Equipment:   Intra-op Plan:   Post-operative Plan: Extubation in OR  Informed Consent: I have reviewed the patients History and Physical, chart, labs and discussed the procedure including the risks, benefits and alternatives for the proposed anesthesia with the patient or authorized representative who has indicated his/her understanding and acceptance.     Dental advisory given  Plan Discussed with: CRNA  Anesthesia Plan Comments:         Anesthesia Quick Evaluation

## 2018-11-16 NOTE — Anesthesia Procedure Notes (Signed)
Procedure Name: Intubation Date/Time: 11/16/2018 9:28 AM Performed by: Caren Macadam, CRNA Pre-anesthesia Checklist: Patient identified, Emergency Drugs available, Suction available and Patient being monitored Patient Re-evaluated:Patient Re-evaluated prior to induction Oxygen Delivery Method: Circle system utilized Preoxygenation: Pre-oxygenation with 100% oxygen Induction Type: IV induction Ventilation: Mask ventilation without difficulty Laryngoscope Size: Miller and 2 Grade View: Grade I Tube type: Oral Tube size: 7.0 mm Number of attempts: 1 Airway Equipment and Method: Stylet and Oral airway Placement Confirmation: ETT inserted through vocal cords under direct vision,  positive ETCO2 and breath sounds checked- equal and bilateral Secured at: 22 cm Tube secured with: Tape Dental Injury: Teeth and Oropharynx as per pre-operative assessment

## 2018-11-17 ENCOUNTER — Encounter (HOSPITAL_BASED_OUTPATIENT_CLINIC_OR_DEPARTMENT_OTHER): Payer: Self-pay | Admitting: Otolaryngology

## 2018-11-17 NOTE — Anesthesia Postprocedure Evaluation (Signed)
Anesthesia Post Note  Patient: Dana Lane  Procedure(s) Performed: TONSILLECTOMY AND ADENOIDECTOMY (Bilateral Throat)     Patient location during evaluation: PACU Anesthesia Type: General Level of consciousness: awake and alert Pain management: pain level controlled Vital Signs Assessment: post-procedure vital signs reviewed and stable Respiratory status: spontaneous breathing, nonlabored ventilation, respiratory function stable and patient connected to nasal cannula oxygen Cardiovascular status: blood pressure returned to baseline and stable Postop Assessment: no apparent nausea or vomiting Anesthetic complications: no    Last Vitals:  Vitals:   11/16/18 1141 11/16/18 1320  BP: 134/85 131/80  Pulse: 85 97  Resp: 12 16  Temp: 36.9 C 37 C  SpO2: 98% 99%    Last Pain:  Vitals:   11/16/18 1320  TempSrc:   PainSc: 8                  Phillips Grout

## 2018-12-02 ENCOUNTER — Ambulatory Visit (INDEPENDENT_AMBULATORY_CARE_PROVIDER_SITE_OTHER): Payer: Commercial Managed Care - PPO | Admitting: Otolaryngology

## 2018-12-15 ENCOUNTER — Encounter: Payer: Self-pay | Admitting: Physical Therapy

## 2018-12-15 ENCOUNTER — Ambulatory Visit: Payer: Commercial Managed Care - PPO | Attending: Family | Admitting: Physical Therapy

## 2018-12-15 ENCOUNTER — Other Ambulatory Visit: Payer: Self-pay

## 2018-12-15 DIAGNOSIS — M25671 Stiffness of right ankle, not elsewhere classified: Secondary | ICD-10-CM | POA: Insufficient documentation

## 2018-12-15 DIAGNOSIS — R262 Difficulty in walking, not elsewhere classified: Secondary | ICD-10-CM | POA: Diagnosis present

## 2018-12-15 DIAGNOSIS — M25571 Pain in right ankle and joints of right foot: Secondary | ICD-10-CM | POA: Insufficient documentation

## 2018-12-15 NOTE — Therapy (Signed)
[facility] [place], [place], [address] Phone: [phone]   Fax:  [phone]  Physical Therapy Evaluation  Patient Details  Name: Dana Lane MRN: [id number] Date of Birth: [birth year] Referring Provider (PT): Evelina Dun, FNP   Encounter Date: 12/15/2018  PT End of Session - 12/15/18 1530    Visit Number  1    Number of Visits  12    Date for PT Re-Evaluation  02/02/19    Authorization Type  FOTO every 5th visit, progress note every 10th visit    PT Start Time  1400    PT Stop Time  1440    PT Time Calculation (min)  40 min    Activity Tolerance  Patient tolerated treatment well;Patient limited by pain    Behavior During Therapy  [facility] for tasks assessed/performed       Past Medical History:  Diagnosis Date  . Ankle sprain    right  . Depression   . GERD (gastroesophageal reflux disease)   . Tonsillitis     Past Surgical History:  Procedure Laterality Date  . EYE SURGERY    . TONSILLECTOMY AND ADENOIDECTOMY Bilateral 11/16/2018   Procedure: TONSILLECTOMY AND ADENOIDECTOMY;  Surgeon: Leta Baptist, MD;  Location: [facility];  Service: ENT;  Laterality: Bilateral;    There were no vitals filed for this visit.   Subjective Assessment - 12/15/18 1526    Subjective  COVID-19 screening performed prior to patient entering the building.Patient arrives to physical therapy with reports of right ankle pain that began after a fall about 3 months ago, March 2020. Patient states when she fell, her right leg twisted behind her and she felt and heard a pop. Patient reported no fractures per x-ray, just a bad sprain per ER MDs. 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 Patient reports pain at worst is a 9/10 and pain at best is 4/10 with ice, Tylenol, and  prescription medication. Patient's goals are to decrease pain, improve movement, improve ability to stand and walk for longer periods of time for work activities.    Pertinent History  unremarkable    Limitations  Standing;Walking    How long can you stand comfortably?  3 hours    Diagnostic tests  x-ray: (-) for fractures    Currently in Pain?  Yes    Pain Score  6     Pain Location  Ankle    Pain Orientation  Right;Lateral    Pain Descriptors / Indicators  Aching;Sore    Pain Type  Acute pain    Pain Onset  More than a month ago    Pain Frequency  Constant    Aggravating Factors   "being on it for long periods of time"    Pain Relieving Factors  "not being on it a lot, putting it up and icing it"    Effect of Pain on Daily Activities  can perform all ADLs but with pain         [facility] PT Assessment - 12/15/18 0001      Assessment   Medical Diagnosis  Sprain of the right ankle    Referring Provider (PT)  Evelina Dun, FNP    Onset Date/Surgical Date  --   3 months ago March 2020   Next MD Visit  n/a    Prior Therapy  no      Precautions   Precautions  None      Restrictions   Weight Bearing Restrictions  No      Balance Screen   Has the patient fallen in the past 6 months  Yes    How many times?  1    Has the patient had a decrease in activity level because of a fear of falling?   No    Is the patient reluctant to leave their home because of a fear of falling?   No      Home Public house managernvironment   Living Environment  Private residence    Living Arrangements  Parent      Prior Function   Level of Independence  Independent with basic ADLs      Observation/Other Assessments   Focus on Therapeutic Outcomes (FOTO)   32% limited      Observation/Other Assessments-Edema    Edema  Circumferential      Circumferential Edema   Circumferential - Right  50 cm    Circumferential - Left   48 cm      ROM / Strength   AROM / PROM / Strength  AROM;PROM;Strength      AROM    Overall AROM   Deficits;Due to pain    AROM Assessment Site  Ankle    Right/Left Ankle  Right    Right Ankle Dorsiflexion  -8    Right Ankle Plantar Flexion  20    Right Ankle Inversion  16    Right Ankle Eversion  4      PROM   Overall PROM   Deficits;Due to pain    PROM Assessment Site  Ankle    Right/Left Ankle  Right    Right Ankle Dorsiflexion  8    Right Ankle Plantar Flexion  22    Right Ankle Inversion  22    Right Ankle Eversion  2      Strength   Strength Assessment Site  Ankle    Right/Left Ankle  Right    Right Ankle Dorsiflexion  4-/5    Right Ankle Plantar Flexion  3/5    Right Ankle Inversion  4-/5    Right Ankle Eversion  4-/5      Palpation   Palpation comment  very tender to lateral malleolus particularly in the calcaneofibular and posterior talofibular ligaments      Ambulation/Gait   Gait Pattern  Step-through pattern;Decreased stance time - right;Abducted- right;Antalgic;Decreased step length - left    Gait velocity  decreased gait speed                Objective measurements completed on examination: See above findings.              PT Education - 12/15/18 1529    Education Details  elevated ankle pumps, ankle circles, seated heel raises, toe raises, calf stretch    Person(s) Educated  Patient    Methods  Explanation;Handout;Demonstration    Comprehension  Verbalized understanding;Returned demonstration          PT Long Term Goals - 12/15/18 1532      PT LONG TERM GOAL #1   Title  Patient will be independent with HEP    Time  6    Period  Weeks    Status  New      PT LONG TERM GOAL #2   Title  Patient will demonsrate 8+ degrees of right ankle DF AROM to improve  gait mechanics    Time  6    Period  Weeks    Status  New      PT LONG TERM GOAL #3   Title  Patient will demonstrate 4+/5 or greater right ankle MMT in all planes to improve stability during functional tasks    Time  6    Period  Weeks    Status  New       PT LONG TERM GOAL #4   Title  Patient will report ability to perform work tasks with right ankle pain less than or equal to 4/10.    Time  6    Period  Weeks    Status  New             Plan - 12/15/18 1530    Clinical Impression Statement  Patient is a 23 year old female who presents to physical therapy with right ankle pain, decreased right ankle ROM, decreased strength, and increased localized edema. Patient very tender to palpation to right lateral ankle, particularly around the posterior talofibular ligament and calcaneofibular ligament. Patient noted with decreased right stance time, decreased left step length, with right foot abducted. Patient and PT reviewed HEP and patient was educated on importance of performing to maximize PT benefit. Patient reported understanding. Patient would benefit from skilled physical therapy to address deficits and address patient's goals.    Personal Factors and Comorbidities  Age    Examination-Activity Limitations  Stand;Squat    Stability/Clinical Decision Making  Stable/Uncomplicated    Clinical Decision Making  Low    Rehab Potential  Good    PT Frequency  2x / week    PT Duration  6 weeks    PT Treatment/Interventions  ADLs/Self Care Home Management;Moist Heat;Electrical Stimulation;Cryotherapy;Ultrasound;Iontophoresis 4mg /ml Dexamethasone;Balance training;Therapeutic exercise;Therapeutic activities;Stair training;Gait training;Neuromuscular re-education;Patient/family education;Dry needling;Passive range of motion;Manual techniques;Vasopneumatic Device;Taping    PT Next Visit Plan  nustep or bike, right ankle AROM exercises and strengthening, modalities PRN for pain relief    PT Home Exercise Plan  see patient education    Consulted and Agree with Plan of Care  Patient       Patient will benefit from skilled therapeutic intervention in order to improve the following deficits and impairments:  Pain, Decreased strength, Increased edema,  Decreased activity tolerance, Decreased balance, Decreased range of motion, Difficulty walking  Visit Diagnosis: 1. Pain in right ankle and joints of right foot   2. Stiffness of right ankle, not elsewhere classified   3. Difficulty in walking, not elsewhere classified        Problem List Patient Active Problem List   Diagnosis Date Noted  . MDD (major depressive disorder), recurrent episode, moderate (HCC) 03/25/2018  . Obesity (BMI 30-39.9) 12/11/2017  . Chest wall pain 09/14/2015  . Esophageal reflux 06/18/2015   Guss BundeKrystle Mangawang, PT, DPT 12/15/2018, 3:36 PM  [facility] [address] [place], [place], [address] Phone: [phone]   Fax:  [phone]  Name: Dana Lane MRN: [id number] Date of Birth: 1995-06-29

## 2018-12-17 ENCOUNTER — Ambulatory Visit: Payer: Commercial Managed Care - PPO | Admitting: Family Medicine

## 2018-12-17 ENCOUNTER — Other Ambulatory Visit: Payer: Self-pay

## 2018-12-17 ENCOUNTER — Ambulatory Visit (INDEPENDENT_AMBULATORY_CARE_PROVIDER_SITE_OTHER): Payer: Commercial Managed Care - PPO | Admitting: Family Medicine

## 2018-12-17 ENCOUNTER — Encounter: Payer: Self-pay | Admitting: Family Medicine

## 2018-12-17 DIAGNOSIS — R0789 Other chest pain: Secondary | ICD-10-CM

## 2018-12-17 DIAGNOSIS — R21 Rash and other nonspecific skin eruption: Secondary | ICD-10-CM | POA: Diagnosis not present

## 2018-12-17 MED ORDER — FAMOTIDINE 20 MG PO TABS: 20.0000 mg | ORAL_TABLET | Freq: Two times a day (BID) | ORAL | 0 refills | Status: DC

## 2018-12-17 MED ORDER — PREDNISONE 20 MG PO TABS: ORAL_TABLET | ORAL | 0 refills | Status: DC

## 2018-12-17 MED ORDER — HYDROXYZINE HCL 25 MG PO TABS: 25.0000 mg | ORAL_TABLET | Freq: Three times a day (TID) | ORAL | 0 refills | Status: DC | PRN

## 2018-12-17 NOTE — Progress Notes (Signed)
Virtual Visit via telephone Note Due to COVID-19, visit is conducted virtually and was requested by patient. This visit type was conducted due to national recommendations for restrictions regarding the COVID-19 Pandemic (e.g. social distancing) in an effort to limit this patient's exposure and mitigate transmission in our community. All issues noted in this document were discussed and addressed.  A physical exam was not performed with this format.   I connected with Dana Lane on 12/17/18 at 1510 by telephone and verified that I am speaking with the correct person using two identifiers. Dana Lane is currently located at home and no one is currently with them during visit. The provider, Kari BaarsMichelle , FNP is located in their office at time of visit.  I discussed the limitations, risks, security and privacy concerns of performing an evaluation and management service by telephone and the availability of in person appointments. I also discussed with the patient that there may be a patient responsible charge related to this service. The patient expressed understanding and agreed to proceed.  Subjective:  Patient ID: Dana Lane, female    DOB: October 02, 1995, 23 y.o.   MRN: [id number]  Chief Complaint:  Rash (hands, feet)   HPI: Dana Lane is a 23 y.o. female presenting on 12/17/2018 for Rash (hands, feet)   Pt reports a red raised pruritic rash to her hands and feet. States the rash looks like blisters. No drainage. No known exposures. Denies changes in hygiene or household products. No new pets. States this started several days ago and is getting worse. She has not tried anything for the rash.  Pt also reports having to wear a face mask and shield while working. States at the end of her work day she has come chest tightness. She states this tightness goes away when she removes her mask and face shield. No shortness of breath, cough, headache, diaphoresis, palpitations, nausea, or vomiting with the  pain. The pain does not radiate.   Rash This is a new problem. The current episode started in the past 7 days. The problem has been gradually worsening since onset. The affected locations include the left hand, left foot, right foot and right hand. The rash is characterized by blistering, itchiness and redness. It is unknown if there was an exposure to a precipitant. Pertinent negatives include no anorexia, congestion, cough, diarrhea, eye pain, facial edema, fatigue, fever, joint pain, nail changes, rhinorrhea, shortness of breath, sore throat or vomiting. Past treatments include nothing.     Relevant past medical, surgical, family, and social history reviewed and updated as indicated.  Allergies and medications reviewed and updated.   Past Medical History:  Diagnosis Date  . Ankle sprain    right  . Depression   . GERD (gastroesophageal reflux disease)   . Tonsillitis     Past Surgical History:  Procedure Laterality Date  . EYE SURGERY    . TONSILLECTOMY AND ADENOIDECTOMY Bilateral 11/16/2018   Procedure: TONSILLECTOMY AND ADENOIDECTOMY;  Surgeon: Newman Pieseoh, Su, MD;  Location: [facility];  Service: ENT;  Laterality: Bilateral;    Social History   Socioeconomic History  . Marital status: Single    Spouse name: Not on file  . Number of children: Not on file  . Years of education: Not on file  . Highest education level: Not on file  Occupational History  . Not on file  Social Needs  . Financial resource strain: Not on file  . Food insecurity    Worry: Not on  file    Inability: Not on file  . Transportation needs    Medical: Not on file    Non-medical: Not on file  Tobacco Use  . Smoking status: Never Smoker  . Smokeless tobacco: Never Used  Substance and Sexual Activity  . Alcohol use: No  . Drug use: No  . Sexual activity: Not on file  Lifestyle  . Physical activity    Days per week: Not on file    Minutes per session: Not on file  . Stress: Not on  file  Relationships  . Social Herbalist on phone: Not on file    Gets together: Not on file    Attends religious service: Not on file    Active member of club or organization: Not on file    Attends meetings of clubs or organizations: Not on file    Relationship status: Not on file  . Intimate partner violence    Fear of current or ex partner: Not on file    Emotionally abused: Not on file    Physically abused: Not on file    Forced sexual activity: Not on file  Other Topics Concern  . Not on file  Social History Narrative  . Not on file    Outpatient Encounter Medications as of 12/17/2018  Medication Sig  . diclofenac (VOLTAREN) 75 MG EC tablet Take 1 tablet (75 mg total) by mouth 2 (two) times daily.  . famotidine (PEPCID) 20 MG tablet Take 1 tablet (20 mg total) by mouth 2 (two) times daily for 14 days.  . fluticasone (FLONASE) 50 MCG/ACT nasal spray Place 2 sprays into both nostrils daily.  . hydrOXYzine (ATARAX/VISTARIL) 25 MG tablet Take 1 tablet (25 mg total) by mouth 3 (three) times daily as needed.  Marland Kitchen omeprazole (PRILOSEC) 40 MG capsule Take 1 capsule (40 mg total) by mouth daily.  . predniSONE (DELTASONE) 20 MG tablet 2 po at sametime daily for 5 days  . venlafaxine XR (EFFEXOR XR) 75 MG 24 hr capsule Take 1 capsule (75 mg total) by mouth daily with breakfast.   No facility-administered encounter medications on file as of 12/17/2018.     Allergies  Allergen Reactions  . Poison Oak Extract [Poison Oak Extract] Itching and Rash  . Chocolate Itching and Swelling  . Eggs Or Egg-Derived Products Diarrhea and Other (See Comments)    High fever    Review of Systems  Constitutional: Negative for activity change, appetite change, chills, diaphoresis, fatigue, fever and unexpected weight change.  HENT: Negative for congestion, rhinorrhea, sore throat, trouble swallowing and voice change.   Eyes: Negative for photophobia, pain, discharge, redness, itching and  visual disturbance.  Respiratory: Positive for chest tightness. Negative for apnea, cough, choking, shortness of breath, wheezing and stridor.   Cardiovascular: Negative for chest pain, palpitations and leg swelling.  Gastrointestinal: Negative for abdominal pain, anorexia, diarrhea, nausea and vomiting.  Musculoskeletal: Negative for arthralgias, joint pain, myalgias, neck pain and neck stiffness.  Skin: Positive for color change and rash. Negative for nail changes and pallor.  Neurological: Negative for dizziness, tremors, seizures, syncope, facial asymmetry, speech difficulty, weakness, light-headedness, numbness and headaches.  Psychiatric/Behavioral: Negative for confusion.  All other systems reviewed and are negative.        Observations/Objective: No vital signs or physical exam, this was a telephone or virtual health encounter.  Pt alert and oriented, answers all questions appropriately, and able to speak in full sentences.    Assessment  and Plan: Beverlyn RouxBess was seen today for rash.  Diagnoses and all orders for this visit:  Rash Rash of unknown origin. Reported characteristics suspicious for poison oak dermatitis. Due to unknown etiology, will treat as an allergic reaction with below. Pt aware to report any new or worsening symptoms. Follow up in 2 weeks if rash persists.  -     predniSONE (DELTASONE) 20 MG tablet; 2 po at sametime daily for 5 days -     famotidine (PEPCID) 20 MG tablet; Take 1 tablet (20 mg total) by mouth 2 (two) times daily for 14 days. -     hydrOXYzine (ATARAX/VISTARIL) 25 MG tablet; Take 1 tablet (25 mg total) by mouth 3 (three) times daily as needed.  Chest wall discomfort Chest discomfort after 8 + hours of mask and face shield use. Discomfort resolves after removing the items. No other associated symptoms. Pt aware to take frequent breaks from the mask and face shield. Pt aware of symptoms that warrant emergent evaluation.    Follow Up Instructions:  Return in about 2 weeks (around 12/31/2018), or if symptoms worsen or fail to improve, for Rash.    I discussed the assessment and treatment plan with the patient. The patient was provided an opportunity to ask questions and all were answered. The patient agreed with the plan and demonstrated an understanding of the instructions.   The patient was advised to call back or seek an in-person evaluation if the symptoms worsen or if the condition fails to improve as anticipated.  The above assessment and management plan was discussed with the patient. The patient verbalized understanding of and has agreed to the management plan. Patient is aware to call the clinic if symptoms persist or worsen. Patient is aware when to return to the clinic for a follow-up visit. Patient educated on when it is appropriate to go to the emergency department.    I provided 15 minutes of non-face-to-face time during this encounter. The call started at 1510. The call ended at 1525. The other time was used for coordination of care.    Kari BaarsMichelle , FNP-C [facility] [address] [place], [place] [address] [phone]

## 2018-12-23 ENCOUNTER — Ambulatory Visit: Payer: Commercial Managed Care - PPO | Attending: Family | Admitting: Physical Therapy

## 2018-12-23 ENCOUNTER — Other Ambulatory Visit: Payer: Self-pay

## 2018-12-23 ENCOUNTER — Encounter: Payer: Self-pay | Admitting: Physical Therapy

## 2018-12-23 DIAGNOSIS — R262 Difficulty in walking, not elsewhere classified: Secondary | ICD-10-CM

## 2018-12-23 DIAGNOSIS — M25671 Stiffness of right ankle, not elsewhere classified: Secondary | ICD-10-CM | POA: Diagnosis present

## 2018-12-23 DIAGNOSIS — M25571 Pain in right ankle and joints of right foot: Secondary | ICD-10-CM | POA: Diagnosis present

## 2018-12-23 NOTE — Therapy (Signed)
[facility] [place], [place], [address] Phone: [phone]   Fax:  [phone]  Physical Therapy Treatment  Patient Details  Name: Dana Lane MRN: [id number] Date of Birth: 1996-06-05 Referring Provider (PT): Evelina Dun, FNP   Encounter Date: 12/23/2018  PT End of Session - 12/23/18 1459    Visit Number  2    Number of Visits  12    Date for PT Re-Evaluation  02/02/19    Authorization Type  FOTO every 5th visit, progress note every 10th visit    PT Start Time  1500    PT Stop Time  1545    PT Time Calculation (min)  45 min    Activity Tolerance  Patient tolerated treatment well    Behavior During Therapy  [facility] for tasks assessed/performed       Past Medical History:  Diagnosis Date  . Ankle sprain    right  . Depression   . GERD (gastroesophageal reflux disease)   . Tonsillitis     Past Surgical History:  Procedure Laterality Date  . EYE SURGERY    . TONSILLECTOMY AND ADENOIDECTOMY Bilateral 11/16/2018   Procedure: TONSILLECTOMY AND ADENOIDECTOMY;  Surgeon: Leta Baptist, MD;  Location: [facility];  Service: Lane;  Laterality: Bilateral;    There were no vitals filed for this visit.  Subjective Assessment - 12/23/18 1459    Subjective  COVID 19 screening performed on patient upon arrival. Reports discomfort/sharp pain in R ankle.    Pertinent History  unremarkable    Limitations  Standing;Walking    How long can you stand comfortably?  3 hours    Diagnostic tests  x-ray: (-) for fractures    Currently in Pain?  Yes    Pain Score  6     Pain Location  Ankle    Pain Orientation  Right    Pain Descriptors / Indicators  Sharp;Discomfort    Pain Type  Acute pain    Pain Onset  More than a month ago    Pain Frequency  Intermittent         [facility] PT Assessment - 12/23/18 0001      Assessment   Medical Diagnosis  Sprain of the right ankle    Referring Provider (PT)  Evelina Dun, FNP    Next  MD Visit  n/a    Prior Therapy  no      Precautions   Precautions  None      Restrictions   Weight Bearing Restrictions  No                   [facility] Adult PT Treatment/Exercise - 12/23/18 0001      Exercises   Exercises  Ankle      Modalities   Modalities  Electrical Stimulation;Vasopneumatic      Electrical Stimulation   Electrical Stimulation Location  R ankle    Electrical Stimulation Action  Pre-Mod    Electrical Stimulation Parameters  80-150 hz x15 min    Electrical Stimulation Goals  Pain      Vasopneumatic   Number Minutes Vasopneumatic   15 minutes    Vasopnuematic Location   Ankle    Vasopneumatic Pressure  Medium    Vasopneumatic Temperature   45      Ankle Exercises: Aerobic   Nustep  L3 x10 min      Ankle Exercises: Standing   Rocker Board  3 minutes    Heel  Raises  Both;20 reps    Toe Raise  20 reps    Other Standing Ankle Exercises  R ankle dynadisc Ant/Post x2 min, Inv/Ev x2 min      Ankle Exercises: Seated   ABC's  1 rep    Ankle Circles/Pumps  AROM;Right;20 reps    Other Seated Ankle Exercises  R ankle squares, triangles x20 reps                  PT Long Term Goals - 12/15/18 1532      PT LONG TERM GOAL #1   Title  Patient will be independent with HEP    Time  6    Period  Weeks    Status  New      PT LONG TERM GOAL #2   Title  Patient will demonsrate 8+ degrees of right ankle DF AROM to improve gait mechanics    Time  6    Period  Weeks    Status  New      PT LONG TERM GOAL #3   Title  Patient will demonstrate 4+/5 or greater right ankle MMT in all planes to improve stability during functional tasks    Time  6    Period  Weeks    Status  New      PT LONG TERM GOAL #4   Title  Patient will report ability to perform work tasks with right ankle pain less than or equal to 4/10.    Time  6    Period  Weeks    Status  New            Plan - 12/23/18 1534    Clinical Impression Statement  Patient  presented in clinic with reports of 6/10 R ankle pain and patient reports that she did work today. Patient able to complete therex well although she did report greater discomfort with ankle inversion. Patient minimally tender to palpation just posterior to the R latearl malleolus. Patient encouraged to complete the HEP at home as directed to which patient reported understanding. Normal modalities response noted following removal of the modalities.    Personal Factors and Comorbidities  Age    Examination-Activity Limitations  Stand;Squat    Stability/Clinical Decision Making  Stable/Uncomplicated    Rehab Potential  Good    PT Frequency  2x / week    PT Duration  6 weeks    PT Treatment/Interventions  ADLs/Self Care Home Management;Moist Heat;Electrical Stimulation;Cryotherapy;Ultrasound;Iontophoresis 4mg /ml Dexamethasone;Balance training;Therapeutic exercise;Therapeutic activities;Stair training;Gait training;Neuromuscular re-education;Patient/family education;Dry needling;Passive range of motion;Manual techniques;Vasopneumatic Device;Taping    PT Next Visit Plan  nustep or bike, right ankle AROM exercises and strengthening, modalities PRN for pain relief    PT Home Exercise Plan  see patient education    Consulted and Agree with Plan of Care  Patient       Patient will benefit from skilled therapeutic intervention in order to improve the following deficits and impairments:  Pain, Decreased strength, Increased edema, Decreased activity tolerance, Decreased balance, Decreased range of motion, Difficulty walking  Visit Diagnosis: 1. Pain in right ankle and joints of right foot   2. Stiffness of right ankle, not elsewhere classified   3. Difficulty in walking, not elsewhere classified        Problem List Patient Active Problem List   Diagnosis Date Noted  . MDD (major depressive disorder), recurrent episode, moderate (HCC) 03/25/2018  . Obesity (BMI 30-39.9) 12/11/2017  . Chest wall pain  09/14/2015  .  Esophageal reflux 06/18/2015    Marvell FullerKelsey P Kennon, PTA 12/23/2018, 3:53 PM  [facility] [address] [place], [place], [address] Phone: [phone]   Fax:  [phone]  Name: Dana Lane MRN: [id number] Date of Birth: 1996-05-27

## 2018-12-28 ENCOUNTER — Ambulatory Visit: Payer: Commercial Managed Care - PPO | Admitting: Physical Therapy

## 2018-12-28 ENCOUNTER — Other Ambulatory Visit: Payer: Self-pay

## 2018-12-28 ENCOUNTER — Encounter: Payer: Self-pay | Admitting: Physical Therapy

## 2018-12-28 DIAGNOSIS — R262 Difficulty in walking, not elsewhere classified: Secondary | ICD-10-CM

## 2018-12-28 DIAGNOSIS — M25671 Stiffness of right ankle, not elsewhere classified: Secondary | ICD-10-CM

## 2018-12-28 DIAGNOSIS — M25571 Pain in right ankle and joints of right foot: Secondary | ICD-10-CM

## 2018-12-28 NOTE — Therapy (Signed)
[facility] [place], [place], [address] Phone: [phone]   Fax:  [phone]  Physical Therapy Treatment  Patient Details  Name: Modestine Scherzinger MRN: [id number] Date of Birth: 04/16/1996 Referring Provider (PT): Evelina Dun, FNP   Encounter Date: 12/28/2018  PT End of Session - 12/28/18 1646    Visit Number  3    Number of Visits  12    Date for PT Re-Evaluation  02/02/19    Authorization Type  FOTO every 5th visit, progress note every 10th visit    PT Start Time  1555    PT Stop Time  1640    PT Time Calculation (min)  45 min    Activity Tolerance  Patient tolerated treatment well    Behavior During Therapy  [facility] for tasks assessed/performed       Past Medical History:  Diagnosis Date  . Ankle sprain    right  . Depression   . GERD (gastroesophageal reflux disease)   . Tonsillitis     Past Surgical History:  Procedure Laterality Date  . EYE SURGERY    . TONSILLECTOMY AND ADENOIDECTOMY Bilateral 11/16/2018   Procedure: TONSILLECTOMY AND ADENOIDECTOMY;  Surgeon: Leta Baptist, MD;  Location: [facility];  Service: ENT;  Laterality: Bilateral;    There were no vitals filed for this visit.  Subjective Assessment - 12/28/18 1605    Subjective  COVID 19 screening performed on patient upon arrival. Reports 7/10 in right ankle    Pertinent History  unremarkable    Limitations  Standing;Walking    How long can you stand comfortably?  3 hours    Diagnostic tests  x-ray: (-) for fractures    Patient Stated Goals  decrease pain walk better    Currently in Pain?  Yes    Pain Score  7     Pain Location  Ankle    Pain Orientation  Right    Pain Descriptors / Indicators  Discomfort    Pain Type  Acute pain    Pain Onset  More than a month ago         [facility] PT Assessment - 12/28/18 0001      Assessment   Medical Diagnosis  Sprain of the right ankle    Referring Provider (PT)  Evelina Dun, FNP    Next  MD Visit  n/a    Prior Therapy  no      Precautions   Precautions  None                   [facility] Adult PT Treatment/Exercise - 12/28/18 0001      Exercises   Exercises  Ankle      Modalities   Modalities  Electrical Stimulation;Vasopneumatic      Electrical Stimulation   Electrical Stimulation Location  R lateral ankle    Electrical Stimulation Action  pre-mod    Electrical Stimulation Parameters  80-150 hz x10 mins    Electrical Stimulation Goals  Pain      Vasopneumatic   Number Minutes Vasopneumatic   10 minutes    Vasopnuematic Location   Ankle    Vasopneumatic Pressure  Medium    Vasopneumatic Temperature   36      Ankle Exercises: Aerobic   Stationary Bike  Level 3 x10 mins      Ankle Exercises: Seated   ABC's  1 rep    Ankle Circles/Pumps  Other reps (comment)  Ankle Circles/Pumps Limitations  dynadisc cw, ccw, x2 min each    Other Seated Ankle Exercises  Rockerboard AP and lateral 3 minutes each      Ankle Exercises: Supine   T-Band  4 way ankle yellow x20 each                  PT Long Term Goals - 12/15/18 1532      PT LONG TERM GOAL #1   Title  Patient will be independent with HEP    Time  6    Period  Weeks    Status  New      PT LONG TERM GOAL #2   Title  Patient will demonsrate 8+ degrees of right ankle DF AROM to improve gait mechanics    Time  6    Period  Weeks    Status  New      PT LONG TERM GOAL #3   Title  Patient will demonstrate 4+/5 or greater right ankle MMT in all planes to improve stability during functional tasks    Time  6    Period  Weeks    Status  New      PT LONG TERM GOAL #4   Title  Patient will report ability to perform work tasks with right ankle pain less than or equal to 4/10.    Time  6    Period  Weeks    Status  New            Plan - 12/28/18 1647    Clinical Impression Statement  Patient was able to tolerate treatment fairly well with some reports of increased pain especially  with exercises involving right ankle inversion. Seated exercises performed due to increase of pain. Patient required tactile cuing to prevent hip internal and external rotation with resisted inversion and eversion. No adverse affects upon removal of modalities.    Personal Factors and Comorbidities  Age    Examination-Activity Limitations  Stand;Squat    Stability/Clinical Decision Making  Stable/Uncomplicated    Clinical Decision Making  Low    Rehab Potential  Good    PT Frequency  2x / week    PT Duration  6 weeks    PT Treatment/Interventions  ADLs/Self Care Home Management;Moist Heat;Electrical Stimulation;Cryotherapy;Ultrasound;Iontophoresis 4mg /ml Dexamethasone;Balance training;Therapeutic exercise;Therapeutic activities;Stair training;Gait training;Neuromuscular re-education;Patient/family education;Dry needling;Passive range of motion;Manual techniques;Vasopneumatic Device;Taping    PT Next Visit Plan  nustep or bike, right ankle AROM exercises and strengthening, modalities PRN for pain relief    Consulted and Agree with Plan of Care  Patient       Patient will benefit from skilled therapeutic intervention in order to improve the following deficits and impairments:  Pain, Decreased strength, Increased edema, Decreased activity tolerance, Decreased balance, Decreased range of motion, Difficulty walking  Visit Diagnosis: 1. Pain in right ankle and joints of right foot   2. Stiffness of right ankle, not elsewhere classified   3. Difficulty in walking, not elsewhere classified        Problem List Patient Active Problem List   Diagnosis Date Noted  . MDD (major depressive disorder), recurrent episode, moderate (HCC) 03/25/2018  . Obesity (BMI 30-39.9) 12/11/2017  . Chest wall pain 09/14/2015  . Esophageal reflux 06/18/2015   Guss BundeKrystle , PT, DPT 12/28/2018, 4:59 PM  [facility] [address] [place], [place], [address] Phone:  [phone]   Fax:  [phone]  Name: Karma LewBess Ponzo MRN: [id number] Date of Birth: 01/22/1996

## 2018-12-30 ENCOUNTER — Ambulatory Visit: Payer: Commercial Managed Care - PPO | Admitting: Physical Therapy

## 2018-12-30 ENCOUNTER — Other Ambulatory Visit: Payer: Self-pay

## 2018-12-30 ENCOUNTER — Encounter: Payer: Self-pay | Admitting: Physical Therapy

## 2018-12-30 DIAGNOSIS — M25671 Stiffness of right ankle, not elsewhere classified: Secondary | ICD-10-CM

## 2018-12-30 DIAGNOSIS — R262 Difficulty in walking, not elsewhere classified: Secondary | ICD-10-CM

## 2018-12-30 DIAGNOSIS — M25571 Pain in right ankle and joints of right foot: Secondary | ICD-10-CM

## 2018-12-30 NOTE — Therapy (Signed)
[facility] [address] [place], [place], [address] Phone: [phone]   Fax:  [phone]  Physical Therapy Treatment  Patient Details  Name: Dana Lane MRN: [id number] Date of Birth: Dec 17, 1995 Referring Provider (PT): Jannifer Rodneyhristy Hawks, FNP   Encounter Date: 12/30/2018  PT End of Session - 12/30/18 1659    Visit Number  4    Number of Visits  12    Date for PT Re-Evaluation  02/02/19    Authorization Type  FOTO every 5th visit, progress note every 10th visit    PT Start Time  1600    PT Stop Time  1653    PT Time Calculation (min)  53 min    Activity Tolerance  Patient tolerated treatment well    Behavior During Therapy  [facility] for tasks assessed/performed       Past Medical History:  Diagnosis Date  . Ankle sprain    right  . Depression   . GERD (gastroesophageal reflux disease)   . Tonsillitis     Past Surgical History:  Procedure Laterality Date  . EYE SURGERY    . TONSILLECTOMY AND ADENOIDECTOMY Bilateral 11/16/2018   Procedure: TONSILLECTOMY AND ADENOIDECTOMY;  Surgeon: Newman Pieseoh, Su, MD;  Location: [facility];  Service: ENT;  Laterality: Bilateral;    There were no vitals filed for this visit.  Subjective Assessment - 12/30/18 1644    Subjective  COVID 19 screening performed on patient upon arrival. Patient reports ongoing pain.    Pertinent History  unremarkable    Limitations  Standing;Walking    How long can you stand comfortably?  3 hours    Diagnostic tests  x-ray: (-) for fractures    Patient Stated Goals  decrease pain walk better    Currently in Pain?  Yes    Pain Score  7     Pain Location  Ankle    Pain Orientation  Right    Pain Descriptors / Indicators  Discomfort    Pain Onset  More than a month ago    Pain Frequency  Intermittent         [facility] PT Assessment - 12/30/18 0001      Assessment   Medical Diagnosis  Sprain of the right ankle    Referring Provider (PT)  Jannifer Rodneyhristy Hawks, FNP     Next MD Visit  n/a    Prior Therapy  no      Precautions   Precautions  None                   [facility] Adult PT Treatment/Exercise - 12/30/18 0001      Exercises   Exercises  Ankle      Modalities   Modalities  Electrical Stimulation;Vasopneumatic      Electrical Stimulation   Electrical Stimulation Location  R lateral ankle    Electrical Stimulation Action  pre-mod    Electrical Stimulation Parameters  80-150 hz x10 mins    Electrical Stimulation Goals  Pain      Vasopneumatic   Number Minutes Vasopneumatic   10 minutes    Vasopnuematic Location   Ankle    Vasopneumatic Pressure  Medium    Vasopneumatic Temperature   36      Ankle Exercises: Aerobic   Stationary Bike  Level 3 x12 mins      Ankle Exercises: Standing   Rocker Board  3 minutes      Ankle Exercises: Seated   Ankle Circles/Pumps Limitations  dynadisc cw, ccw, x 3 min each    BAPS  Sitting;Level 2   x2 minutes     Ankle Exercises: Supine   T-Band  4 way ankle yellow x20 each                  PT Long Term Goals - 12/15/18 1532      PT LONG TERM GOAL #1   Title  Patient will be independent with HEP    Time  6    Period  Weeks    Status  New      PT LONG TERM GOAL #2   Title  Patient will demonsrate 8+ degrees of right ankle DF AROM to improve gait mechanics    Time  6    Period  Weeks    Status  New      PT LONG TERM GOAL #3   Title  Patient will demonstrate 4+/5 or greater right ankle MMT in all planes to improve stability during functional tasks    Time  6    Period  Weeks    Status  New      PT LONG TERM GOAL #4   Title  Patient will report ability to perform work tasks with right ankle pain less than or equal to 4/10.    Time  6    Period  Weeks    Status  New            Plan - 12/30/18 1701    Clinical Impression Statement  Patient was able to tolerate treatment well but with ongoing ankle pain. Patient noted with improved form with inversion and  eversion with resisted TBand. Patient reported pain decreased to 5/10 at end of session.    Personal Factors and Comorbidities  Age    Examination-Activity Limitations  Stand;Squat    Stability/Clinical Decision Making  Stable/Uncomplicated    Clinical Decision Making  Low    Rehab Potential  Good    PT Frequency  2x / week    PT Duration  6 weeks    PT Treatment/Interventions  ADLs/Self Care Home Management;Moist Heat;Electrical Stimulation;Cryotherapy;Ultrasound;Iontophoresis 4mg /ml Dexamethasone;Balance training;Therapeutic exercise;Therapeutic activities;Stair training;Gait training;Neuromuscular re-education;Patient/family education;Dry needling;Passive range of motion;Manual techniques;Vasopneumatic Device;Taping    PT Next Visit Plan  nustep or bike, right ankle AROM exercises and strengthening, modalities PRN for pain relief    PT Home Exercise Plan  see patient education    Consulted and Agree with Plan of Care  Patient       Patient will benefit from skilled therapeutic intervention in order to improve the following deficits and impairments:  Pain, Decreased strength, Increased edema, Decreased activity tolerance, Decreased balance, Decreased range of motion, Difficulty walking  Visit Diagnosis: 1. Pain in right ankle and joints of right foot   2. Stiffness of right ankle, not elsewhere classified   3. Difficulty in walking, not elsewhere classified        Problem List Patient Active Problem List   Diagnosis Date Noted  . MDD (major depressive disorder), recurrent episode, moderate ([place]) 03/25/2018  . Obesity (BMI 30-39.9) 12/11/2017  . Chest wall pain 09/14/2015  . Esophageal reflux 06/18/2015   Gabriela Eves, PT, DPT 12/30/2018, 5:09 PM  [facility] [place], [place], [address] Phone: [phone]   Fax:  [phone]  Name: Dana Lane MRN: [id number] Date of Birth: October 08, 1995

## 2018-12-31 ENCOUNTER — Other Ambulatory Visit: Payer: Self-pay

## 2018-12-31 ENCOUNTER — Ambulatory Visit: Payer: Commercial Managed Care - PPO | Admitting: Family

## 2018-12-31 ENCOUNTER — Encounter: Payer: Self-pay | Admitting: Family

## 2018-12-31 VITALS — BP 114/74 | HR 91 | Temp 97.5°F | Ht 64.0 in | Wt 207.8 lb

## 2018-12-31 DIAGNOSIS — L301 Dyshidrosis [pompholyx]: Secondary | ICD-10-CM | POA: Diagnosis not present

## 2018-12-31 MED ORDER — TRIAMCINOLONE ACETONIDE 0.5 % EX OINT: 1.0000 "application " | TOPICAL_OINTMENT | Freq: Two times a day (BID) | CUTANEOUS | 1 refills | Status: DC

## 2018-12-31 NOTE — Patient Instructions (Signed)
Dyshidrotic Eczema Dyshidrotic eczema (pompholyx) is a type of eczema that causes very itchy (pruritic), fluid-filled blisters (vesicles) to form on the hands and feet. It can affect people of any age, but is more common before the age of 40. There is no cure, but treatment and certain lifestyle changes can help relieve symptoms. What are the causes? The cause of this condition is not known. What increases the risk? You are more likely to develop this condition if:  You wash your hands frequently.  You have a personal history or family history of eczema, allergies, asthma, or hay fever.  You are allergic to metals such as nickel or cobalt.  You work with cement.  You smoke. What are the signs or symptoms? Symptoms of this condition may affect the hands, feet, or both. Symptoms may come and go (recur), and may include:  Severe itching, which may happen before blisters appear.  Blisters. These may form suddenly. ? In the early stages, blisters may form near the fingertips. ? In severe cases, blisters may grow to large blister masses (bullae). ? Blisters resolve in 2-3 weeks without bursting. This is followed by a dry phase in which itching eases.  Pain and swelling.  Cracks or long, narrow openings (fissures) in the skin.  Severe dryness.  Ridges on the nails. How is this diagnosed? This condition may be diagnosed based on:  A physical exam.  Your symptoms.  Your medical history.  Skin scrapings to rule out a fungal infection.  Testing a swab of fluid for bacteria (culture).  Removing and checking a small piece of skin (biopsy) in order to test for infection or to rule out other conditions.  Skin patch tests. These tests involve taking patches that contain possible allergens and placing them on your back. Your health care provider will wait a few days and then check to see if an allergic reaction occurred. These tests may be done if your health care provider suspects  allergic reactions, or to rule out other types of eczema. You may be referred to a health care provider who specializes in the skin (dermatologist) to help diagnose and treat this condition. How is this treated? There is no cure for this condition, but treatment can help relieve symptoms. Depending on how many blisters you have and how severe they are, your health care provider may suggest:  Avoiding allergens, irritants, or triggers that worsen symptoms. This may involve lifestyle changes such as: ? Using different lotions or soaps. ? Avoiding hot weather or places that will cause you to sweat a lot. ? Managing stress with coping techniques such as relaxation and exercise, and asking for help when you need it. ? Diet changes as recommended by your health care provider.  Using a clean, damp towel (cool compress) to relieve symptoms.  Soaking in a bath that contains a type of salt that relieves irritation (aluminum acetate soaks).  Medicine taken by mouth to reduce itching (oral antihistamines).  Medicine applied to the skin to reduce swelling and irritation (topical corticosteroids).  Medicine that reduces the activity of the body's disease-fighting system (immunosuppressants) to treat inflammation. This may be given in severe cases.  Antibiotic medicines to treat bacterial infection.  Light therapy (phototherapy). This involves shining ultraviolet (UV) light on affected skin in order to reduce itchiness and inflammation. Follow these instructions at home: Bathing and skin care   Wash skin gently. After bathing or washing your hands, pat your skin dry. Avoid rubbing your skin.  Remove   all jewelry before bathing. If the skin under the jewelry stays wet, blisters may form or get worse.  Apply cool compresses as told by your health care provider: ? Soak a clean towel in cool water. ? Wring out excess water until towel is damp. ? Place the towel over affected skin. Leave the towel on  for 20 minutes at a time, 2-3 times a day.  Use mild soaps, cleansers, and lotions that do not contain dyes, perfumes, or other irritants.  Keep your skin hydrated. To do this: ? Avoid very hot water. Take lukewarm baths or showers. ? Apply moisturizer within three minutes of bathing. This locks in moisture. Medicines  Take and apply over-the-counter and prescription medicines only as told by your health care provider.  If you were prescribed antibiotic medicine, take or apply it as told by your health care provider. Do not stop using the antibiotic even if you start to feel better. General instructions  Identify and avoid triggers and allergens.  Keep fingernails short to avoid breaking open the skin while scratching.  Use waterproof gloves to protect your hands when doing work that keeps your hands wet for a long time.  Wear socks to keep your feet dry.  Do not use any products that contain nicotine or tobacco, such as cigarettes and e-cigarettes. If you need help quitting, ask your health care provider.  Keep all follow-up visits as told by your health care provider. This is important. Contact a health care provider if:  You have symptoms that do not go away.  You have signs of infection, such as: ? Crusting, pus, or a bad smell. ? More redness, swelling, or pain. ? Increased warmth in the affected area. Summary  Dyshidrotic eczema (pompholyx) is a type of eczema that causes very itchy (pruritic), fluid-filled blisters (vesicles) to form on the hands and feet.  The cause of this condition is not known.  There is no cure for this condition, but treatment can help relieve symptoms. Treatment depends on how many blisters you have and how severe they are.  Use mild soaps, cleansers, and lotions that do not contain dyes, perfumes, or other irritants. Keep your skin hydrated. This information is not intended to replace advice given to you by your health care provider. Make sure  you discuss any questions you have with your health care provider. Document Released: 10/23/2016 Document Revised: 09/29/2018 Document Reviewed: 10/23/2016 Elsevier Patient Education  2020 Elsevier Inc.  

## 2018-12-31 NOTE — Progress Notes (Signed)
   Subjective:    Patient ID: Dana Lane, female    DOB: Aug 12, 1995, 23 y.o.   MRN: [id number]  Chief Complaint  Patient presents with  . bumps/blisters on hands    itching   Pt presents to the office today with rash on bilateral hands. Rash This is a recurrent problem. The current episode started yesterday. The problem has been gradually worsening since onset. The affected locations include the left fingers and left hand. The rash is characterized by blistering, itchiness and pain. She was exposed to nothing. Pertinent negatives include no congestion, cough, fatigue, fever or sore throat. Past treatments include anti-itch cream. The treatment provided mild relief.      Review of Systems  Constitutional: Negative for fatigue and fever.  HENT: Negative for congestion and sore throat.   Respiratory: Negative for cough.   Skin: Positive for rash.  All other systems reviewed and are negative.      Objective:   Physical Exam Vitals signs reviewed.  Constitutional:      General: She is not in acute distress.    Appearance: She is well-developed.  Eyes:     Pupils: Pupils are equal, round, and reactive to light.  Neck:     Musculoskeletal: Normal range of motion and neck supple.     Thyroid: No thyromegaly.  Cardiovascular:     Rate and Rhythm: Normal rate and regular rhythm.     Heart sounds: Normal heart sounds. No murmur.  Pulmonary:     Effort: Pulmonary effort is normal. No respiratory distress.     Breath sounds: Normal breath sounds. No wheezing.  Abdominal:     General: Bowel sounds are normal. There is no distension.     Palpations: Abdomen is soft.     Tenderness: There is no abdominal tenderness.  Musculoskeletal: Normal range of motion.        General: No tenderness.  Skin:    General: Skin is warm and dry.     Findings: Rash present. Rash is vesicular.     Comments: Vesicular rash on left middle finger, left ring, and right wrist  Neurological:     Mental  Status: She is alert and oriented to person, place, and time.     Cranial Nerves: No cranial nerve deficit.     Deep Tendon Reflexes: Reflexes are normal and symmetric.  Psychiatric:        Behavior: Behavior normal.        Thought Content: Thought content normal.        Judgment: Judgment normal.      BP 114/74   Pulse 91   Temp (!) 97.5 F (36.4 C) (Oral)   Ht 5\' 4"  (1.626 m)   Wt 207 lb 12.8 oz (94.3 kg)   BMI 35.67 kg/m       Assessment & Plan:  Dana Lane comes in today with chief complaint of bumps/blisters on hands (itching)   Diagnosis and orders addressed:  1. Dyshidrotic eczema Avoid harsh chemicals  Do not scratch Keep clean and dry Avoid any perfume lotions, soaps, or hand sanitizers  RTO if symptoms worsen or do not improve  - triamcinolone ointment (KENALOG) 0.5 %; Apply 1 application topically 2 (two) times daily.  Dispense: 60 g; Refill: [place], FNP

## 2019-01-04 ENCOUNTER — Other Ambulatory Visit: Payer: Self-pay

## 2019-01-04 ENCOUNTER — Ambulatory Visit: Payer: Commercial Managed Care - PPO | Admitting: Physical Therapy

## 2019-01-04 ENCOUNTER — Telehealth: Payer: Self-pay | Admitting: Pulmonary Disease

## 2019-01-04 DIAGNOSIS — M25671 Stiffness of right ankle, not elsewhere classified: Secondary | ICD-10-CM

## 2019-01-04 DIAGNOSIS — M25571 Pain in right ankle and joints of right foot: Secondary | ICD-10-CM | POA: Diagnosis not present

## 2019-01-04 DIAGNOSIS — R262 Difficulty in walking, not elsewhere classified: Secondary | ICD-10-CM

## 2019-01-04 NOTE — Therapy (Signed)
[facility] [address] [place], [place], [address] Phone: [phone]   Fax:  [phone]  Physical Therapy Treatment  Patient Details  Name: Dana Lane MRN: [id number] Date of Birth: 08-28-1995 Referring Provider (PT): Jannifer Rodneyhristy Hawks, FNP   Encounter Date: 01/04/2019  PT End of Session - 01/04/19 1613    Visit Number  5    Number of Visits  12    Date for PT Re-Evaluation  02/02/19    Authorization Type  FOTO every 5th visit, progress note every 10th visit    PT Start Time  1600    PT Stop Time  1649    PT Time Calculation (min)  49 min    Activity Tolerance  Patient tolerated treatment well    Behavior During Therapy  [facility] for tasks assessed/performed       Past Medical History:  Diagnosis Date  . Ankle sprain    right  . Depression   . GERD (gastroesophageal reflux disease)   . Tonsillitis     Past Surgical History:  Procedure Laterality Date  . EYE SURGERY    . TONSILLECTOMY AND ADENOIDECTOMY Bilateral 11/16/2018   Procedure: TONSILLECTOMY AND ADENOIDECTOMY;  Surgeon: Newman Pieseoh, Su, MD;  Location: [facility];  Service: ENT;  Laterality: Bilateral;    There were no vitals filed for this visit.  Subjective Assessment - 01/04/19 1613    Subjective  COVID 19 screening performed on patient upon arrival. Patient reported feeling "alright."    Pertinent History  unremarkable    Limitations  Standing;Walking    How long can you stand comfortably?  3 hours    Diagnostic tests  x-ray: (-) for fractures    Patient Stated Goals  decrease pain walk better    Currently in Pain?  Yes    Pain Score  6     Pain Location  Ankle    Pain Orientation  Right    Pain Descriptors / Indicators  Discomfort    Pain Type  Acute pain    Pain Onset  More than a month ago    Pain Frequency  Intermittent         [facility] PT Assessment - 01/04/19 0001      Assessment   Medical Diagnosis  Sprain of the right ankle    Referring  Provider (PT)  Jannifer Rodneyhristy Hawks, FNP    Next MD Visit  n/a    Prior Therapy  no      Precautions   Precautions  None      Observation/Other Assessments   Focus on Therapeutic Outcomes (FOTO)   33% limited                   [facility] Adult PT Treatment/Exercise - 01/04/19 0001      Exercises   Exercises  Ankle      Modalities   Modalities  Electrical Stimulation;Vasopneumatic      Electrical Stimulation   Electrical Stimulation Location  R lateral ankle    Electrical Stimulation Action  pre-mod    Electrical Stimulation Parameters  80-150 hz x10 mins    Electrical Stimulation Goals  Pain      Vasopneumatic   Number Minutes Vasopneumatic   10 minutes    Vasopnuematic Location   Ankle    Vasopneumatic Pressure  Medium    Vasopneumatic Temperature   36      Ankle Exercises: Aerobic   Nustep  Level 4 x15 mins  Ankle Exercises: Standing   Rocker Board  3 minutes    Heel Raises  Both;20 reps    Toe Raise  20 reps      Ankle Exercises: Seated   Ankle Circles/Pumps Limitations  dynadisc cw, ccw, x 3 min each      Ankle Exercises: Supine   T-Band  4 way ankle yellow x20 each                  PT Long Term Goals - 12/15/18 1532      PT LONG TERM GOAL #1   Title  Patient will be independent with HEP    Time  6    Period  Weeks    Status  New      PT LONG TERM GOAL #2   Title  Patient will demonsrate 8+ degrees of right ankle DF AROM to improve gait mechanics    Time  6    Period  Weeks    Status  New      PT LONG TERM GOAL #3   Title  Patient will demonstrate 4+/5 or greater right ankle MMT in all planes to improve stability during functional tasks    Time  6    Period  Weeks    Status  New      PT LONG TERM GOAL #4   Title  Patient will report ability to perform work tasks with right ankle pain less than or equal to 4/10.    Time  6    Period  Weeks    Status  New            Plan - 01/04/19 1646    Clinical Impression Statement   Patient was able to tolerate treatment fairly well with progression of sets for 4 way ankle resistance bands. Patient noted with improved form with all exercises and did not require any cuing for technique. Patient's FOTO limitation 33%.    Personal Factors and Comorbidities  Age    Examination-Activity Limitations  Stand;Squat    Stability/Clinical Decision Making  Stable/Uncomplicated    Clinical Decision Making  Low    Rehab Potential  Good    PT Frequency  2x / week    PT Duration  6 weeks    PT Treatment/Interventions  ADLs/Self Care Home Management;Moist Heat;Electrical Stimulation;Cryotherapy;Ultrasound;Iontophoresis 4mg /ml Dexamethasone;Balance training;Therapeutic exercise;Therapeutic activities;Stair training;Gait training;Neuromuscular re-education;Patient/family education;Dry needling;Passive range of motion;Manual techniques;Vasopneumatic Device;Taping    PT Next Visit Plan  nustep or bike, right ankle AROM exercises and strengthening, modalities PRN for pain relief    Consulted and Agree with Plan of Care  Patient       Patient will benefit from skilled therapeutic intervention in order to improve the following deficits and impairments:  Pain, Decreased strength, Increased edema, Decreased activity tolerance, Decreased balance, Decreased range of motion, Difficulty walking  Visit Diagnosis: 1. Pain in right ankle and joints of right foot   2. Stiffness of right ankle, not elsewhere classified   3. Difficulty in walking, not elsewhere classified        Problem List Patient Active Problem List   Diagnosis Date Noted  . MDD (major depressive disorder), recurrent episode, moderate ([place]) 03/25/2018  . Obesity (BMI 30-39.9) 12/11/2017  . Chest wall pain 09/14/2015  . Esophageal reflux 06/18/2015   Gabriela Eves, PT, DPT 01/04/2019, 4:57 PM  [facility] [address] [place], [place], [address] Phone: [phone]   Fax:   [phone]  Name:  Dana Lane MRN: [id number] Date of Birth: 31-Mar-1996

## 2019-01-04 NOTE — Telephone Encounter (Signed)
Dr. Ander Slade please advise results of HST.

## 2019-01-06 ENCOUNTER — Other Ambulatory Visit: Payer: Self-pay

## 2019-01-06 ENCOUNTER — Ambulatory Visit: Payer: Commercial Managed Care - PPO | Admitting: Physical Therapy

## 2019-01-06 DIAGNOSIS — M25571 Pain in right ankle and joints of right foot: Secondary | ICD-10-CM

## 2019-01-06 DIAGNOSIS — R262 Difficulty in walking, not elsewhere classified: Secondary | ICD-10-CM

## 2019-01-06 DIAGNOSIS — M25671 Stiffness of right ankle, not elsewhere classified: Secondary | ICD-10-CM

## 2019-01-06 NOTE — Telephone Encounter (Signed)
Dr. Olalere, please advise. Thanks 

## 2019-01-06 NOTE — Telephone Encounter (Signed)
Attempted to call pt but unable to reach. Left message for pt to return call. 

## 2019-01-06 NOTE — Therapy (Signed)
[facility] [place], [place], [address] Phone: [phone]   Fax:  [phone]  Physical Therapy Treatment  Patient Details  Name: Dana Lane MRN: [id number] Date of Birth: Nov 08, 1995 Referring Provider (PT): Evelina Dun, FNP   Encounter Date: 01/06/2019  PT End of Session - 01/06/19 1639    Visit Number  6    Number of Visits  12    Date for PT Re-Evaluation  02/02/19    Authorization Type  FOTO every 5th visit, progress note every 10th visit    PT Start Time  1600    PT Stop Time  1646    PT Time Calculation (min)  46 min    Activity Tolerance  Patient tolerated treatment well    Behavior During Therapy  [facility] for tasks assessed/performed       Past Medical History:  Diagnosis Date  . Ankle sprain    right  . Depression   . GERD (gastroesophageal reflux disease)   . Tonsillitis     Past Surgical History:  Procedure Laterality Date  . EYE SURGERY    . TONSILLECTOMY AND ADENOIDECTOMY Bilateral 11/16/2018   Procedure: TONSILLECTOMY AND ADENOIDECTOMY;  Surgeon: Leta Baptist, MD;  Location: [place];  Service: ENT;  Laterality: Bilateral;    There were no vitals filed for this visit.  Subjective Assessment - 01/06/19 1643    Subjective  COVID 19 screening performed on patient upon arrival. Patient reported pain is still about the same.    Pertinent History  unremarkable    Limitations  Standing;Walking    How long can you stand comfortably?  3 hours    Diagnostic tests  x-ray: (-) for fractures    Patient Stated Goals  decrease pain walk better    Currently in Pain?  Yes    Pain Score  7     Pain Location  Ankle    Pain Orientation  Right;Lateral    Pain Descriptors / Indicators  Sore;Discomfort    Pain Type  Acute pain    Pain Onset  More than a month ago    Pain Frequency  Intermittent         [facility] PT Assessment - 01/06/19 0001      Assessment   Medical Diagnosis  Sprain of the  right ankle    Referring Provider (PT)  Evelina Dun, FNP    Next MD Visit  n/a    Prior Therapy  no                   [facility] Adult PT Treatment/Exercise - 01/06/19 0001      Exercises   Exercises  Ankle      Modalities   Modalities  Electrical Stimulation;Vasopneumatic;Ultrasound      Electrical Stimulation   Electrical Stimulation Location  R lateral ankle    Electrical Stimulation Action  pre-mod    Electrical Stimulation Parameters  80-150 hz x10 mins    Electrical Stimulation Goals  Pain      Ultrasound   Ultrasound Location  right lateral ankle    Ultrasound Parameters  3.3 mhz, 100%, 1.2 w/cm2 x10 mins    Ultrasound Goals  Pain      Vasopneumatic   Number Minutes Vasopneumatic   10 minutes    Vasopnuematic Location   Ankle    Vasopneumatic Pressure  Medium    Vasopneumatic Temperature   36      Ankle Exercises: Aerobic  Nustep  Level 4 x15 mins      Ankle Exercises: Seated   ABC's  2 reps   Capital and lower case                 PT Long Term Goals - 01/06/19 1643      PT LONG TERM GOAL #1   Title  Patient will be independent with HEP    Time  6    Period  Weeks    Status  On-going      PT LONG TERM GOAL #2   Title  Patient will demonsrate 8+ degrees of right ankle DF AROM to improve gait mechanics    Time  6    Period  Weeks    Status  On-going      PT LONG TERM GOAL #3   Title  Patient will demonstrate 4+/5 or greater right ankle MMT in all planes to improve stability during functional tasks    Time  6    Period  Weeks    Status  On-going      PT LONG TERM GOAL #4   Title  Patient will report ability to perform work tasks with right ankle pain less than or equal to 4/10.    Time  6    Period  Weeks    Status  On-going            Plan - 01/06/19 1639    Clinical Impression Statement  Patient was able to tolerate treatment fairly. Patient reports pain has not improved since start of PT therefore session focused on  more conservative treatment. Patient denied an increase of pain at end of US. Normal response to modalities upon removal.    Personal Factors and Comorbidities  Age    Examination-Activity Limitations  Stand;Squat    Stability/Clinical Decision Making  Stable/Uncomplicated    Clinical Decision Making  Low    Rehab Potential  Good    PT Frequency  2x / week    PT Duration  6 weeks    PT Treatment/Interventions  ADLs/Self Care Home Management;Moist Heat;Electrical Stimulation;Cryotherapy;Ultrasound;Iontophoresis 4mg /ml Dexamethasone;Balance training;Therapeutic exercise;Therapeutic activities;Stair training;Gait training;Neuromuscular re-education;Patient/family education;Dry needling;Passive range of motion;Manual techniques;Vasopneumatic Device;Taping    PT Next Visit Plan  nustep or bike, right ankle AROM exercises and strengthening, modalities PRN for pain relief    PT Home Exercise Plan  see patient education    Consulted and Agree with Plan of Care  Patient       Patient will benefit from skilled therapeutic intervention in order to improve the following deficits and impairments:  Pain, Decreased strength, Increased edema, Decreased activity tolerance, Decreased balance, Decreased range of motion, Difficulty walking  Visit Diagnosis: 1. Pain in right ankle and joints of right foot   2. Stiffness of right ankle, not elsewhere classified   3. Difficulty in walking, not elsewhere classified        Problem List Patient Active Problem List   Diagnosis Date Noted  . MDD (major depressive disorder), recurrent episode, moderate (HCC) 03/25/2018  . Obesity (BMI 30-39.9) 12/11/2017  . Chest wall pain 09/14/2015  . Esophageal reflux 06/18/2015   Guss BundeKrystle Mangawang, PT, DPT 01/06/2019, 6:01 PM  [facility] [address] [place], [place], [address] Phone: [phone]   Fax:  [phone]  Name: Dana LewBess Lane MRN: [id number] Date of Birth:  April 17, 1996

## 2019-01-06 NOTE — Telephone Encounter (Signed)
Was read by Dr. Wolfgang Phoenix was negative for significant sleep disordered breathing AHI of less than 5  Does not need CPAP  Regular exercises to aid weight loss Sleep with head of the bed elevated Encourage sleeping in the lateral position  We will be glad to follow-up if any progression of symptoms despite instituting above

## 2019-01-07 ENCOUNTER — Encounter: Payer: Self-pay | Admitting: Family

## 2019-01-07 ENCOUNTER — Ambulatory Visit (INDEPENDENT_AMBULATORY_CARE_PROVIDER_SITE_OTHER): Payer: Commercial Managed Care - PPO | Admitting: Family

## 2019-01-07 ENCOUNTER — Telehealth: Payer: Self-pay | Admitting: *Deleted

## 2019-01-07 DIAGNOSIS — R51 Headache: Secondary | ICD-10-CM

## 2019-01-07 DIAGNOSIS — L301 Dyshidrosis [pompholyx]: Secondary | ICD-10-CM | POA: Diagnosis not present

## 2019-01-07 DIAGNOSIS — F411 Generalized anxiety disorder: Secondary | ICD-10-CM

## 2019-01-07 DIAGNOSIS — R519 Headache, unspecified: Secondary | ICD-10-CM

## 2019-01-07 MED ORDER — PREDNISONE 10 MG (21) PO TBPK: ORAL_TABLET | ORAL | 0 refills | Status: DC

## 2019-01-07 NOTE — Telephone Encounter (Signed)
[email or web address]

## 2019-01-07 NOTE — Progress Notes (Signed)
   Virtual Visit via telephone Note  I connected with Dana Lane on 01/07/19 at 12:18 pm by telephone and verified that I am speaking with the correct person using two identifiers. Dana Lane is currently located at work and no one is currently with her during visit. The provider, Evelina Dun, FNP is located in their office at time of visit.  I discussed the limitations, risks, security and privacy concerns of performing an evaluation and management service by telephone and the availability of in person appointments. I also discussed with the patient that there may be a patient responsible charge related to this service. The patient expressed understanding and agreed to proceed.   History and Present Illness:  Pt calls the office requesting note for work because she can not tolerate wearing a mask. She states she has anxiety and wearing the mask for 8 hours a day gives her a headache. She states she has tried several different types without success.  Anxiety Presents for follow-up visit. Symptoms include decreased concentration, excessive worry, nervous/anxious behavior and restlessness. Patient reports no irritability. The severity of symptoms is moderate. The quality of sleep is good.    Rash This is a new problem. The current episode started 1 to 4 weeks ago. The problem has been waxing and waning since onset. The affected locations include the left foot, right foot, right arm, left hand and right hand. The rash is characterized by itchiness and blistering. She was exposed to nothing. Past treatments include topical steroids. The treatment provided mild relief.      Review of Systems  Constitutional: Negative for irritability.  Skin: Positive for rash.  Psychiatric/Behavioral: Positive for decreased concentration. The patient is nervous/anxious.   All other systems reviewed and are negative.    Observations/Objective: No SOB or distress noted   Assessment and Plan: Dana Lane  comes in today with chief complaint of No chief complaint on file.   Diagnosis and orders addressed:  1. Dyshidrotic eczema Do not scratch Avoid hard chemicals, perfumes, or scented lotions - predniSONE (STERAPRED UNI-PAK 21 TAB) 10 MG (21) TBPK tablet; Use as directed  Dispense: 21 tablet; Refill: 0  2. GAD (generalized anxiety disorder) Stress management discussed  Continue Effexor and Vistaril as needed  3. Nonintractable headache, unspecified chronicity pattern, unspecified headache type  Work note sent to her MyChart     I discussed the assessment and treatment plan with the patient. The patient was provided an opportunity to ask questions and all were answered. The patient agreed with the plan and demonstrated an understanding of the instructions.   The patient was advised to call back or seek an in-person evaluation if the symptoms worsen or if the condition fails to improve as anticipated.  The above assessment and management plan was discussed with the patient. The patient verbalized understanding of and has agreed to the management plan. Patient is aware to call the clinic if symptoms persist or worsen. Patient is aware when to return to the clinic for a follow-up visit. Patient educated on when it is appropriate to go to the emergency department.   Time call ended:  12:29 pm  I provided 11 minutes of non-face-to-face time during this encounter.    Evelina Dun, FNP

## 2019-01-07 NOTE — Telephone Encounter (Signed)
Called and spoke with pt letting her know the results of the sleep study. Pt verbalized understanding. Nothing further needed.

## 2019-01-07 NOTE — Telephone Encounter (Signed)
Pt is returning call. Cb is [phone].

## 2019-01-07 NOTE — Telephone Encounter (Signed)
Patient's HR called to ask if you would do an addemdum to your office note concerning today's visit with patient.  Patient gets two fifteen minute breaks and a thirty minute lunch break daily.  If you could document this in notes , it would be helpful... Patient will not be able to work since they can not accommodate her request to not wear a mask.  She will still be considered an employee but she would be eligible to apply for disability.

## 2019-01-08 ENCOUNTER — Other Ambulatory Visit: Payer: Self-pay | Admitting: Family

## 2019-01-08 DIAGNOSIS — F331 Major depressive disorder, recurrent, moderate: Secondary | ICD-10-CM

## 2019-01-10 NOTE — Telephone Encounter (Signed)
Pt did not tell me about breaks without mask.

## 2019-01-11 ENCOUNTER — Other Ambulatory Visit: Payer: Self-pay

## 2019-01-11 ENCOUNTER — Encounter: Payer: Self-pay | Admitting: Physical Therapy

## 2019-01-11 ENCOUNTER — Ambulatory Visit: Payer: Commercial Managed Care - PPO | Admitting: Physical Therapy

## 2019-01-11 DIAGNOSIS — M25671 Stiffness of right ankle, not elsewhere classified: Secondary | ICD-10-CM

## 2019-01-11 DIAGNOSIS — R262 Difficulty in walking, not elsewhere classified: Secondary | ICD-10-CM

## 2019-01-11 DIAGNOSIS — M25571 Pain in right ankle and joints of right foot: Secondary | ICD-10-CM

## 2019-01-11 NOTE — Therapy (Signed)
[facility] [address] [place], [place], [address] Phone: [phone]   Fax:  [phone]  Physical Therapy Treatment  Patient Details  Name: Dana Lane MRN: [id number] Date of Birth: 05-02-1996 Referring Provider (PT): Jannifer Rodneyhristy Hawks, FNP   Encounter Date: 01/11/2019  PT End of Session - 01/11/19 1710    Visit Number  7    Number of Visits  12    Date for PT Re-Evaluation  02/02/19    Authorization Type  FOTO every 5th visit, progress note every 10th visit    PT Start Time  0400    PT Stop Time  0459    PT Time Calculation (min)  59 min       Past Medical History:  Diagnosis Date  . Ankle sprain    right  . Depression   . GERD (gastroesophageal reflux disease)   . Tonsillitis     Past Surgical History:  Procedure Laterality Date  . EYE SURGERY    . TONSILLECTOMY AND ADENOIDECTOMY Bilateral 11/16/2018   Procedure: TONSILLECTOMY AND ADENOIDECTOMY;  Surgeon: Newman Pieseoh, Su, MD;  Location: [facility];  Service: ENT;  Laterality: Bilateral;    There were no vitals filed for this visit.  Subjective Assessment - 01/11/19 1711    Subjective  COVID-19 screen performed prior to patient entering clinic.  I'm getting some better.    Pertinent History  unremarkable    Limitations  Standing;Walking    How long can you stand comfortably?  3 hours    Diagnostic tests  x-ray: (-) for fractures    Patient Stated Goals  decrease pain walk better    Currently in Pain?  Yes    Pain Score  6     Pain Location  Ankle    Pain Orientation  Right;Lateral    Pain Descriptors / Indicators  Sore;Discomfort    Pain Type  Acute pain    Pain Onset  More than a month ago    Pain Frequency  Intermittent                       [facility] Adult PT Treatment/Exercise - 01/11/19 0001      Exercises   Exercises  Knee/Hip      Knee/Hip Exercises: Aerobic   Nustep  Level 4 x 15 minutes.      Ultrasound   Ultrasound Location   Right lateral ankle    Ultrasound Parameters  Combo e'stim/U/S at 1.50 W/CM2 x 8 minutes at 50%.      Vasopneumatic   Number Minutes Vasopneumatic   20 minutes    Vasopnuematic Location   --   Right ankle.   Vasopneumatic Pressure  Medium      Manual Therapy   Manual Therapy  Soft tissue mobilization    Manual therapy comments  IASTM x 3 minutes to patient's right lateral ankle and distal Achilles.                  PT Long Term Goals - 01/06/19 1643      PT LONG TERM GOAL #1   Title  Patient will be independent with HEP    Time  6    Period  Weeks    Status  On-going      PT LONG TERM GOAL #2   Title  Patient will demonsrate 8+ degrees of right ankle DF AROM to improve gait mechanics    Time  6  Period  Weeks    Status  On-going      PT LONG TERM GOAL #3   Title  Patient will demonstrate 4+/5 or greater right ankle MMT in all planes to improve stability during functional tasks    Time  6    Period  Weeks    Status  On-going      PT LONG TERM GOAL #4   Title  Patient will report ability to perform work tasks with right ankle pain less than or equal to 4/10.    Time  6    Period  Weeks    Status  On-going            Plan - 01/11/19 1718    Clinical Impression Statement  Patient did well today though her CC continues to be palpable pain over her right Calcaneofibular ligament.       Patient will benefit from skilled therapeutic intervention in order to improve the following deficits and impairments:  Pain, Decreased strength, Increased edema, Decreased activity tolerance, Decreased balance, Decreased range of motion, Difficulty walking  Visit Diagnosis: 1. Pain in right ankle and joints of right foot   2. Stiffness of right ankle, not elsewhere classified   3. Difficulty in walking, not elsewhere classified        Problem List Patient Active Problem List   Diagnosis Date Noted  . MDD (major depressive disorder), recurrent episode, moderate  ([place]) 03/25/2018  . Obesity (BMI 30-39.9) 12/11/2017  . Chest wall pain 09/14/2015  . Esophageal reflux 06/18/2015    APPLEGATE, [place] MPT 01/11/2019, 5:20 PM  [facility] [address] [place], [place], [address] Phone: [phone]   Fax:  [phone]  Name: Dana Lane MRN: [id number] Date of Birth: 10-11-1995

## 2019-01-13 ENCOUNTER — Other Ambulatory Visit: Payer: Self-pay

## 2019-01-13 ENCOUNTER — Ambulatory Visit: Payer: Commercial Managed Care - PPO | Admitting: Physical Therapy

## 2019-01-13 DIAGNOSIS — R262 Difficulty in walking, not elsewhere classified: Secondary | ICD-10-CM

## 2019-01-13 DIAGNOSIS — M25571 Pain in right ankle and joints of right foot: Secondary | ICD-10-CM

## 2019-01-13 DIAGNOSIS — M25671 Stiffness of right ankle, not elsewhere classified: Secondary | ICD-10-CM

## 2019-01-13 NOTE — Therapy (Signed)
[facility] [place], [place], [address] Phone: [phone]   Fax:  [phone]  Physical Therapy Treatment  Patient Details  Name: Dana Lane MRN: [id number] Date of Birth: 1995/12/16 Referring Provider (PT): Evelina Dun, FNP   Encounter Date: 01/13/2019  PT End of Session - 01/13/19 1612    Visit Number  8    Number of Visits  12    Date for PT Re-Evaluation  02/02/19    Authorization Type  FOTO every 5th visit, progress note every 10th visit    PT Start Time  1600    PT Stop Time  1649    PT Time Calculation (min)  49 min    Activity Tolerance  Patient tolerated treatment well    Behavior During Therapy  [facility] for tasks assessed/performed       Past Medical History:  Diagnosis Date  . Ankle sprain    right  . Depression   . GERD (gastroesophageal reflux disease)   . Tonsillitis     Past Surgical History:  Procedure Laterality Date  . EYE SURGERY    . TONSILLECTOMY AND ADENOIDECTOMY Bilateral 11/16/2018   Procedure: TONSILLECTOMY AND ADENOIDECTOMY;  Surgeon: Leta Baptist, MD;  Location: [place];  Service: ENT;  Laterality: Bilateral;    There were no vitals filed for this visit.  Subjective Assessment - 01/13/19 1615    Subjective  COVID-19 screen performed prior to patient entering clinic.  Patient reports feeling like a 7/10 today and reports minimal improvements since start of PT    Pertinent History  unremarkable    Limitations  Standing;Walking    How long can you stand comfortably?  3 hours    Diagnostic tests  x-ray: (-) for fractures    Patient Stated Goals  decrease pain walk better    Currently in Pain?  Yes    Pain Score  7     Pain Location  Ankle    Pain Orientation  Right;Lateral    Pain Descriptors / Indicators  Sore    Pain Type  Acute pain    Pain Onset  More than a month ago    Pain Frequency  Intermittent         [facility] PT Assessment - 01/13/19 0001      Assessment   Medical Diagnosis  Sprain of the right ankle    Referring Provider (PT)  Evelina Dun, FNP    Next MD Visit  n/a    Prior Therapy  no                   [facility] Adult PT Treatment/Exercise - 01/13/19 0001      Exercises   Exercises  Ankle      Modalities   Modalities  Electrical Stimulation;Vasopneumatic;Ultrasound  (Pended)       Acupuncturist Stimulation Location  R lateral ankle  (Pended)     Electrical Stimulation Action  pre-mod  (Pended)     Electrical Stimulation Parameters  80-150 hz x10 mins  (Pended)     Electrical Stimulation Goals  Pain  (Pended)       Ultrasound   Ultrasound Location  right lateral ankle    Ultrasound Parameters  Combo e-stim/US 50% 1.5 w/cm2, 12 minutes    Ultrasound Goals  Pain      Vasopneumatic   Number Minutes Vasopneumatic   10 minutes    Vasopnuematic Location   Ankle  Vasopneumatic Pressure  Medium    Vasopneumatic Temperature   36      Ankle Exercises: Aerobic   Nustep  Level 5 x15 mins                  PT Long Term Goals - 01/06/19 1643      PT LONG TERM GOAL #1   Title  Patient will be independent with HEP    Time  6    Period  Weeks    Status  On-going      PT LONG TERM GOAL #2   Title  Patient will demonsrate 8+ degrees of right ankle DF AROM to improve gait mechanics    Time  6    Period  Weeks    Status  On-going      PT LONG TERM GOAL #3   Title  Patient will demonstrate 4+/5 or greater right ankle MMT in all planes to improve stability during functional tasks    Time  6    Period  Weeks    Status  On-going      PT LONG TERM GOAL #4   Title  Patient will report ability to perform work tasks with right ankle pain less than or equal to 4/10.    Time  6    Period  Weeks    Status  On-going            Plan - 01/13/19 1641    Clinical Impression Statement  Patient responded well to therapy with no complaints of increased pain. Patient denied pain  with combo and reported combo, e-stim and ice helps reduce her pain. Patient and PT discussed completing remaining visits and assessing goals to determine continuation or discharge with follow up with MD secondary to lack of progress. Patient reported understanding. Normal response to modalities upon removal.    Personal Factors and Comorbidities  Age    Examination-Activity Limitations  Stand;Squat    Stability/Clinical Decision Making  Stable/Uncomplicated    Clinical Decision Making  Low    Rehab Potential  Good    PT Frequency  2x / week    PT Duration  6 weeks    PT Treatment/Interventions  ADLs/Self Care Home Management;Moist Heat;Electrical Stimulation;Cryotherapy;Ultrasound;Iontophoresis 4mg /ml Dexamethasone;Balance training;Therapeutic exercise;Therapeutic activities;Stair training;Gait training;Neuromuscular re-education;Patient/family education;Dry needling;Passive range of motion;Manual techniques;Vasopneumatic Device;Taping    PT Next Visit Plan  cont combo to right lateral ankle. nustep or bike, right ankle AROM exercises and strengthening, modalities PRN for pain relief    PT Home Exercise Plan  see patient education    Consulted and Agree with Plan of Care  Patient       Patient will benefit from skilled therapeutic intervention in order to improve the following deficits and impairments:  Pain, Decreased strength, Increased edema, Decreased activity tolerance, Decreased balance, Decreased range of motion, Difficulty walking  Visit Diagnosis: 1. Pain in right ankle and joints of right foot   2. Stiffness of right ankle, not elsewhere classified   3. Difficulty in walking, not elsewhere classified        Problem List Patient Active Problem List   Diagnosis Date Noted  . MDD (major depressive disorder), recurrent episode, moderate (HCC) 03/25/2018  . Obesity (BMI 30-39.9) 12/11/2017  . Chest wall pain 09/14/2015  . Esophageal reflux 06/18/2015   Guss BundeKrystle Mangawang, PT,  DPT 01/13/2019, 6:21 PM  [facility] [address] [place], [place], [address] Phone: [phone]   Fax:  [phone]  Name: Dana Lane MRN: [id number] Date of Birth: [birth year]

## 2019-01-18 ENCOUNTER — Encounter: Payer: Self-pay | Admitting: Physical Therapy

## 2019-01-18 ENCOUNTER — Ambulatory Visit: Payer: Commercial Managed Care - PPO | Admitting: Physical Therapy

## 2019-01-18 ENCOUNTER — Other Ambulatory Visit: Payer: Self-pay

## 2019-01-18 DIAGNOSIS — M25571 Pain in right ankle and joints of right foot: Secondary | ICD-10-CM | POA: Diagnosis not present

## 2019-01-18 DIAGNOSIS — R262 Difficulty in walking, not elsewhere classified: Secondary | ICD-10-CM

## 2019-01-18 DIAGNOSIS — M25671 Stiffness of right ankle, not elsewhere classified: Secondary | ICD-10-CM

## 2019-01-18 NOTE — Therapy (Signed)
[facility] [address] [place], [place], [address] Phone: [phone]   Fax:  [phone]  Physical Therapy Treatment  Patient Details  Name: Dana Lane MRN: [id number] Date of Birth: 05/17/1996 Referring Provider (PT): Jannifer Rodneyhristy Hawks, FNP   Encounter Date: 01/18/2019  PT End of Session - 01/18/19 1642    Visit Number  9    Number of Visits  12    Date for PT Re-Evaluation  02/02/19    Authorization Type  FOTO every 5th visit, progress note every 10th visit    PT Start Time  0400    PT Stop Time  0453    PT Time Calculation (min)  53 min    Activity Tolerance  Patient tolerated treatment well    Behavior During Therapy  [facility] for tasks assessed/performed       Past Medical History:  Diagnosis Date  . Ankle sprain    right  . Depression   . GERD (gastroesophageal reflux disease)   . Tonsillitis     Past Surgical History:  Procedure Laterality Date  . EYE SURGERY    . TONSILLECTOMY AND ADENOIDECTOMY Bilateral 11/16/2018   Procedure: TONSILLECTOMY AND ADENOIDECTOMY;  Surgeon: Newman Pieseoh, Su, MD;  Location: [facility];  Service: ENT;  Laterality: Bilateral;    There were no vitals filed for this visit.  Subjective Assessment - 01/18/19 1638    Subjective  COVID-19 screen performed prior to patient entering clinic.  Ankle hurts today.    Limitations  Standing;Walking    Currently in Pain?  Yes    Pain Score  6     Pain Location  Ankle    Pain Orientation  Right;Lateral    Pain Descriptors / Indicators  Sore    Pain Type  Acute pain    Pain Onset  More than a month ago                       [facility] - 01/18/19 0001      Exercises   Exercises  Knee/Hip      Knee/Hip Exercises: Aerobic   Nustep  Level 4 x 15 minutes.      Programme researcher, broadcasting/film/videolectrical Stimulation   Electrical Stimulation Location  Right distal calf/lateral ankle.    Electrical Stimulation Action  Pre-mod.    Electrical Stimulation Parameters  80-150 Hz x 20 minutes.    Electrical Stimulation Goals  Pain      Vasopneumatic   Number Minutes Vasopneumatic   20 minutes    Vasopnuematic Location   --   Right ankle.   Vasopneumatic Pressure  Medium      Manual Therapy   Manual Therapy  Soft tissue mobilization    Manual therapy comments  In prone:  IASTM x 8 minutes to right distal calf.                  PT Long Term Goals - 01/06/19 1643      PT LONG TERM GOAL #1   Title  Patient will be independent with HEP    Time  6    Period  Weeks    Status  On-going      PT LONG TERM GOAL #2   Title  Patient will demonsrate 8+ degrees of right ankle DF AROM to improve gait mechanics    Time  6    Period  Weeks    Status  On-going  PT LONG TERM GOAL #3   Title  Patient will demonstrate 4+/5 or greater right ankle MMT in all planes to improve stability during functional tasks    Time  6    Period  Weeks    Status  On-going      PT LONG TERM GOAL #4   Title  Patient will report ability to perform work tasks with right ankle pain less than or equal to 4/10.    Time  6    Period  Weeks    Status  On-going            Plan - 01/18/19 1643    Clinical Impression Statement  Patient did well today.  We focused STW/M on her right distal calf/Achilles which was her CC.    Stability/Clinical Decision Making  Stable/Uncomplicated    PT Treatment/Interventions  ADLs/Self Care Home Management;Moist Heat;Electrical Stimulation;Cryotherapy;Ultrasound;Iontophoresis 4mg /ml Dexamethasone;Balance training;Therapeutic exercise;Therapeutic activities;Stair training;Gait training;Neuromuscular re-education;Patient/family education;Dry needling;Passive range of motion;Manual techniques;Vasopneumatic Device;Taping    PT Next Visit Plan  cont combo to right lateral ankle. nustep or bike, right ankle AROM exercises and strengthening, modalities PRN for pain relief    PT Home Exercise Plan  see  patient education    Consulted and Agree with Plan of Care  Patient       Patient will benefit from skilled therapeutic intervention in order to improve the following deficits and impairments:  Pain, Decreased strength, Increased edema, Decreased activity tolerance, Decreased balance, Decreased range of motion, Difficulty walking  Visit Diagnosis: 1. Pain in right ankle and joints of right foot   2. Stiffness of right ankle, not elsewhere classified   3. Difficulty in walking, not elsewhere classified        Problem List Patient Active Problem List   Diagnosis Date Noted  . MDD (major depressive disorder), recurrent episode, moderate ([place]) 03/25/2018  . Obesity (BMI 30-39.9) 12/11/2017  . Chest wall pain 09/14/2015  . Esophageal reflux 06/18/2015    APPLEGATE, [place]  MPT 01/18/2019, 4:57 PM  [facility] [address] [place], [place], [address] Phone: [phone]   Fax:  [phone]  Name: Dana Lane MRN: [id number] Date of Birth: [birth year]

## 2019-01-20 ENCOUNTER — Other Ambulatory Visit: Payer: Self-pay

## 2019-01-20 ENCOUNTER — Ambulatory Visit: Payer: Commercial Managed Care - PPO | Admitting: Physical Therapy

## 2019-01-20 DIAGNOSIS — M25671 Stiffness of right ankle, not elsewhere classified: Secondary | ICD-10-CM

## 2019-01-20 DIAGNOSIS — R262 Difficulty in walking, not elsewhere classified: Secondary | ICD-10-CM

## 2019-01-20 DIAGNOSIS — M25571 Pain in right ankle and joints of right foot: Secondary | ICD-10-CM

## 2019-01-20 NOTE — Therapy (Addendum)
[facility] [place], [place], [address] Phone: [phone]   Fax:  [phone]  Physical Therapy Treatment  Patient Details  Name: Dana Lane MRN: [id number] Date of Birth: September 29, 1995 Referring Provider (PT): Evelina Dun, FNP   Encounter Date: 01/20/2019  PT End of Session - 01/20/19 1657    Visit Number  10    Number of Visits  12    Date for PT Re-Evaluation  02/02/19    Authorization Type  FOTO every 5th visit, progress note every 10th visit    PT Start Time  0400    PT Stop Time  0457    PT Time Calculation (min)  57 min    Activity Tolerance  Patient tolerated treatment well    Behavior During Therapy  [facility] for tasks assessed/performed       Past Medical History:  Diagnosis Date  . Ankle sprain    right  . Depression   . GERD (gastroesophageal reflux disease)   . Tonsillitis     Past Surgical History:  Procedure Laterality Date  . EYE SURGERY    . TONSILLECTOMY AND ADENOIDECTOMY Bilateral 11/16/2018   Procedure: TONSILLECTOMY AND ADENOIDECTOMY;  Surgeon: Leta Baptist, MD;  Location: [place];  Service: ENT;  Laterality: Bilateral;    There were no vitals filed for this visit.  Subjective Assessment - 01/20/19 1657    Subjective  COVID-19 screen performed prior to patient entering clinic.  About the same.    Pertinent History  unremarkable    Limitations  Standing;Walking    How long can you stand comfortably?  3 hours    Diagnostic tests  x-ray: (-) for fractures    Patient Stated Goals  decrease pain walk better    Currently in Pain?  Yes    Pain Score  6     Pain Location  Ankle    Pain Orientation  Right;Lateral    Pain Descriptors / Indicators  Sore    Pain Type  Acute pain    Pain Onset  More than a month ago                       [facility] Adult PT Treatment/Exercise - 01/20/19 0001      Exercises   Exercises  Knee/Hip      Knee/Hip Exercises: Aerobic   Nustep  Level 4 x 15 minutes.      Modalities   Modalities  Electrical Stimulation;Moist Heat;Ultrasound      Moist Heat Therapy   Number Minutes Moist Heat  20 Minutes    Moist Heat Location  --   Right ankle.     Electrical Stimulation   Electrical Stimulation Location  Right lateral ligamentous region of patient's right ankle.    Electrical Stimulation Action  Pre-mod.    Electrical Stimulation Parameters  80-150 Hz x 20 minutes.    Electrical Stimulation Goals  Pain      Ultrasound   Ultrasound Location  Right lateral ankle.    Ultrasound Parameters  Combo e'stim/U/S at 1.50 W/CM2 x 8 minutes.    Ultrasound Goals  Pain      Manual Therapy   Manual Therapy  Soft tissue mobilization    Soft tissue mobilization  STW/M x 5 minutes to patient right posterior calcaneofibular ligament.                  PT Long Term Goals - 01/06/19 1643  PT LONG TERM GOAL #1   Title  Patient will be independent with HEP    Time  6    Period  Weeks    Status  On-going      PT LONG TERM GOAL #2   Title  Patient will demonsrate 8+ degrees of right ankle DF AROM to improve gait mechanics    Time  6    Period  Weeks    Status  On-going      PT LONG TERM GOAL #3   Title  Patient will demonstrate 4+/5 or greater right ankle MMT in all planes to improve stability during functional tasks    Time  6    Period  Weeks    Status  On-going      PT LONG TERM GOAL #4   Title  Patient will report ability to perform work tasks with right ankle pain less than or equal to 4/10.    Time  6    Period  Weeks    Status  On-going            Plan - 01/20/19 1713    Clinical Impression Statement  CC today was palpable tenderness over her right posterior calcaneofibular ligament.  This continues to bother her.  She did well with treatment today.    Rehab Potential  Good    PT Frequency  2x / week    PT Duration  6 weeks    PT Treatment/Interventions  ADLs/Self Care Home  Management;Moist Heat;Electrical Stimulation;Cryotherapy;Ultrasound;Iontophoresis 81m/ml Dexamethasone;Balance training;Therapeutic exercise;Therapeutic activities;Stair training;Gait training;Neuromuscular re-education;Patient/family education;Dry needling;Passive range of motion;Manual techniques;Vasopneumatic Device;Taping    PT Next Visit Plan  cont combo to right lateral ankle. nustep or bike, right ankle AROM exercises and strengthening, modalities PRN for pain relief    PT Home Exercise Plan  see patient education    Consulted and Agree with Plan of Care  Patient       Patient will benefit from skilled therapeutic intervention in order to improve the following deficits and impairments:  Pain, Decreased strength, Increased edema, Decreased activity tolerance, Decreased balance, Decreased range of motion, Difficulty walking  Visit Diagnosis: 1. Pain in right ankle and joints of right foot   2. Stiffness of right ankle, not elsewhere classified   3. Difficulty in walking, not elsewhere classified        Problem List Patient Active Problem List   Diagnosis Date Noted  . MDD (major depressive disorder), recurrent episode, moderate ([place] 03/25/2018  . Obesity (BMI 30-39.9) 12/11/2017  . Chest wall pain 09/14/2015  . Esophageal reflux 06/18/2015   Progress Note Reporting Period 12/15/18 to 01/20/19.  See note below for Objective Data and Assessment of Progress/Goals. Continued pain over right calcaneofibular ligament and distal Achilles.  No goals met.     , CMaliMPT 01/20/2019, 5:16 PM  [facility]MC[place] [address]Phone: [phone]  Fax:  [phone] Name: Dana DundonMRN: [id number] of Birth: 211-26-1997

## 2019-01-25 ENCOUNTER — Ambulatory Visit: Payer: Commercial Managed Care - PPO | Attending: Family | Admitting: Physical Therapy

## 2019-01-25 ENCOUNTER — Encounter: Payer: Self-pay | Admitting: Physical Therapy

## 2019-01-25 ENCOUNTER — Other Ambulatory Visit: Payer: Self-pay

## 2019-01-25 DIAGNOSIS — R262 Difficulty in walking, not elsewhere classified: Secondary | ICD-10-CM | POA: Diagnosis present

## 2019-01-25 DIAGNOSIS — M25571 Pain in right ankle and joints of right foot: Secondary | ICD-10-CM | POA: Diagnosis not present

## 2019-01-25 DIAGNOSIS — M25671 Stiffness of right ankle, not elsewhere classified: Secondary | ICD-10-CM | POA: Insufficient documentation

## 2019-01-25 NOTE — Therapy (Signed)
[facility] [place], [place], [address] Phone: [phone]   Fax:  [phone]  Physical Therapy Treatment  Patient Details  Name: Savera Donson MRN: [id number] Date of Birth: 12-24-1995 Referring Provider (PT): Evelina Dun, FNP   Encounter Date: 01/25/2019  PT End of Session - 01/25/19 1642    Visit Number  11    Number of Visits  12    Date for PT Re-Evaluation  02/02/19    Authorization Type  FOTO every 5th visit, progress note every 10th visit    PT Start Time  0400    PT Stop Time  0452    PT Time Calculation (min)  52 min    Activity Tolerance  Patient tolerated treatment well    Behavior During Therapy  [facility] for tasks assessed/performed       Past Medical History:  Diagnosis Date  . Ankle sprain    right  . Depression   . GERD (gastroesophageal reflux disease)   . Tonsillitis     Past Surgical History:  Procedure Laterality Date  . EYE SURGERY    . TONSILLECTOMY AND ADENOIDECTOMY Bilateral 11/16/2018   Procedure: TONSILLECTOMY AND ADENOIDECTOMY;  Surgeon: Leta Baptist, MD;  Location: [facility];  Service: ENT;  Laterality: Bilateral;    There were no vitals filed for this visit.  Subjective Assessment - 01/25/19 1634    Pertinent History  unremarkable    Limitations  Standing;Walking    How long can you stand comfortably?  3 hours    Diagnostic tests  x-ray: (-) for fractures    Patient Stated Goals  decrease pain walk better                       [facility] Adult PT Treatment/Exercise - 01/25/19 0001      Exercises   Exercises  Knee/Hip      Knee/Hip Exercises: Aerobic   Nustep  Level 4 x 15 minutes.      Modalities   Modalities  Electrical engineer Stimulation Location  Right lateral ankle.    Electrical Stimulation Action  Pre-mod.    Electrical Stimulation Parameters  80-150 Hz x 20 minutes.    Electrical Stimulation  Goals  Pain      Vasopneumatic   Number Minutes Vasopneumatic   20 minutes    Vasopnuematic Location   --   Right ankle.   Vasopneumatic Pressure  Medium      Ankle Exercises: Standing   Other Standing Ankle Exercises  Rockerboard x 3 minutes f/b dynadisc in all direction x 3 minutes.      Ankle Exercises: Seated   Other Seated Ankle Exercises  Green theraband resisted right ankle eversion and plantarflexion to faigue.                  PT Long Term Goals - 01/06/19 1643      PT LONG TERM GOAL #1   Title  Patient will be independent with HEP    Time  6    Period  Weeks    Status  On-going      PT LONG TERM GOAL #2   Title  Patient will demonsrate 8+ degrees of right ankle DF AROM to improve gait mechanics    Time  6    Period  Weeks    Status  On-going      PT LONG TERM  GOAL #3   Title  Patient will demonstrate 4+/5 or greater right ankle MMT in all planes to improve stability during functional tasks    Time  6    Period  Weeks    Status  On-going      PT LONG TERM GOAL #4   Title  Patient will report ability to perform work tasks with right ankle pain less than or equal to 4/10.    Time  6    Period  Weeks    Status  On-going            Plan - 01/25/19 1641    Clinical Impression Statement  Patient did well with ther ex today though she continues to c/o right lateral ankle pain.       Patient will benefit from skilled therapeutic intervention in order to improve the following deficits and impairments:  Pain, Decreased strength, Increased edema, Decreased activity tolerance, Decreased balance, Decreased range of motion, Difficulty walking  Visit Diagnosis: 1. Pain in right ankle and joints of right foot   2. Stiffness of right ankle, not elsewhere classified   3. Difficulty in walking, not elsewhere classified        Problem List Patient Active Problem List   Diagnosis Date Noted  . MDD (major depressive disorder), recurrent episode,  moderate ([facility]) 03/25/2018  . Obesity (BMI 30-39.9) 12/11/2017  . Chest wall pain 09/14/2015  . Esophageal reflux 06/18/2015    APPLEGATE, [place] MPT 01/25/2019, 4:56 PM  [facility] [address] [place], [place], [address] Phone: [phone]   Fax:  [phone]  Name: Karma LewBess Redditt MRN: [id number] Date of Birth: 01-Dec-1995

## 2019-01-27 ENCOUNTER — Other Ambulatory Visit: Payer: Self-pay

## 2019-01-27 ENCOUNTER — Encounter: Payer: Self-pay | Admitting: Physical Therapy

## 2019-01-27 ENCOUNTER — Ambulatory Visit: Payer: Commercial Managed Care - PPO | Admitting: Physical Therapy

## 2019-01-27 DIAGNOSIS — M25671 Stiffness of right ankle, not elsewhere classified: Secondary | ICD-10-CM

## 2019-01-27 DIAGNOSIS — M25571 Pain in right ankle and joints of right foot: Secondary | ICD-10-CM

## 2019-01-27 DIAGNOSIS — R262 Difficulty in walking, not elsewhere classified: Secondary | ICD-10-CM

## 2019-01-27 NOTE — Therapy (Signed)
[facility] [place], [place], [address] Phone: [phone]   Fax:  [phone]  Physical Therapy Treatment  Patient Details  Name: Gilberto Streck MRN: [id number] Date of Birth: 1995/09/05 Referring Provider (PT): Evelina Dun, FNP   Encounter Date: 01/27/2019  PT End of Session - 01/27/19 1731    Visit Number  12    Number of Visits  12    Date for PT Re-Evaluation  02/02/19    Authorization Type  FOTO every 5th visit, progress note every 10th visit    PT Start Time  0400    PT Stop Time  0444    PT Time Calculation (min)  44 min    Activity Tolerance  Patient tolerated treatment well    Behavior During Therapy  [facility] for tasks assessed/performed       Past Medical History:  Diagnosis Date  . Ankle sprain    right  . Depression   . GERD (gastroesophageal reflux disease)   . Tonsillitis     Past Surgical History:  Procedure Laterality Date  . EYE SURGERY    . TONSILLECTOMY AND ADENOIDECTOMY Bilateral 11/16/2018   Procedure: TONSILLECTOMY AND ADENOIDECTOMY;  Surgeon: Leta Baptist, MD;  Location: [place];  Service: ENT;  Laterality: Bilateral;    There were no vitals filed for this visit.  Subjective Assessment - 01/27/19 1639    Subjective  COVID-19 screen performed prior to patient entering clinic.  About teh same.                       [facility] Adult PT Treatment/Exercise - 01/27/19 0001      Exercises   Exercises  Knee/Hip      Knee/Hip Exercises: Aerobic   Nustep  Level 4 x 15 minutes.      Modalities   Modalities  Electrical Stimulation;Vasopneumatic      Electrical Stimulation   Electrical Stimulation Location  Right lateral ankle.    Electrical Stimulation Action  Pre-mod.    Electrical Stimulation Parameters  80-150 Hz x 20 minutes.    Electrical Stimulation Goals  Pain      Vasopneumatic   Number Minutes Vasopneumatic   20 minutes    Vasopnuematic Location   --    Right ankle.   Vasopneumatic Pressure  Low                  PT Long Term Goals - 01/27/19 1639      PT LONG TERM GOAL #1   Title  Patient will be independent with HEP    Time  6    Period  Weeks    Status  Achieved      PT LONG TERM GOAL #2   Title  Patient will demonsrate 8+ degrees of right ankle DF AROM to improve gait mechanics    Period  Weeks    Status  Achieved      PT LONG TERM GOAL #3   Title  Patient will demonstrate 4+/5 or greater right ankle MMT in all planes to improve stability during functional tasks    Time  6    Period  Weeks    Status  Achieved      PT LONG TERM GOAL #4   Title  Patient will report ability to perform work tasks with right ankle pain less than or equal to 4/10.    Time  6    Period  Weeks  Status  Not Met            Plan - 01/27/19 1732    Clinical Impression Statement  See "Therapy Note" section.    Stability/Clinical Decision Making  Stable/Uncomplicated    PT Treatment/Interventions  ADLs/Self Care Home Management;Moist Heat;Electrical Stimulation;Cryotherapy;Ultrasound;Iontophoresis 89m/ml Dexamethasone;Balance training;Therapeutic exercise;Therapeutic activities;Stair training;Gait training;Neuromuscular re-education;Patient/family education;Dry needling;Passive range of motion;Manual techniques;Vasopneumatic Device;Taping    PT Next Visit Plan  cont combo to right lateral ankle. nustep or bike, right ankle AROM exercises and strengthening, modalities PRN for pain relief    PT Home Exercise Plan  see patient education    Consulted and Agree with Plan of Care  Patient       Patient will benefit from skilled therapeutic intervention in order to improve the following deficits and impairments:  Pain, Decreased strength, Increased edema, Decreased activity tolerance, Decreased balance, Decreased range of motion, Difficulty walking  Visit Diagnosis: 1. Pain in right ankle and joints of right foot   2. Stiffness of  right ankle, not elsewhere classified   3. Difficulty in walking, not elsewhere classified        Problem List Patient Active Problem List   Diagnosis Date Noted  . MDD (major depressive disorder), recurrent episode, moderate ([place] 03/25/2018  . Obesity (BMI 30-39.9) 12/11/2017  . Chest wall pain 09/14/2015  . Esophageal reflux 06/18/2015   PHYSICAL THERAPY DISCHARGE SUMMARY  Visits from Start of Care: 12.  Current functional level related to goals / functional outcomes: See above.   Remaining deficits: Patient met her right ankle strength and range of motion goals but continues to report pain.   Education / Equipment: HEP Plan: Patient agrees to discharge.  Patient goals were partially met. Patient is being discharged due to lack of progress.  ?????      , CMaliMPT 01/27/2019, 5:33 PM  [facility]492 Fairway DriveMSnyder [place] [address]Phone: [phone]  Fax:  [phone] Name: BArrabella WestermanMRN: [id number] of Birth: 2December 13, 1997

## 2019-02-03 ENCOUNTER — Other Ambulatory Visit: Payer: Self-pay | Admitting: Family

## 2019-02-03 DIAGNOSIS — M25571 Pain in right ankle and joints of right foot: Secondary | ICD-10-CM

## 2019-02-18 ENCOUNTER — Other Ambulatory Visit: Payer: Self-pay

## 2019-02-18 ENCOUNTER — Ambulatory Visit (INDEPENDENT_AMBULATORY_CARE_PROVIDER_SITE_OTHER): Payer: Commercial Managed Care - PPO | Admitting: Family

## 2019-02-18 ENCOUNTER — Encounter: Payer: Self-pay | Admitting: Family

## 2019-02-18 DIAGNOSIS — K921 Melena: Secondary | ICD-10-CM | POA: Diagnosis not present

## 2019-02-18 DIAGNOSIS — R1012 Left upper quadrant pain: Secondary | ICD-10-CM

## 2019-02-18 DIAGNOSIS — K219 Gastro-esophageal reflux disease without esophagitis: Secondary | ICD-10-CM

## 2019-02-18 MED ORDER — FAMOTIDINE 20 MG PO TABS: 20.0000 mg | ORAL_TABLET | Freq: Every day | ORAL | 1 refills | Status: DC

## 2019-02-18 NOTE — Progress Notes (Signed)
Virtual Visit via telephone Note Due to COVID-19 pandemic this visit was conducted virtually. This visit type was conducted due to national recommendations for restrictions regarding the COVID-19 Pandemic (e.g. social distancing, sheltering in place) in an effort to limit this patient's exposure and mitigate transmission in our community. All issues noted in this document were discussed and addressed.  A physical exam was not performed with this format.  I connected with Dana Lane on 02/18/19 at 1:41 pm by telephone and verified that I am speaking with the correct person using two identifiers. Dana Lane is currently located at home and no on3 is currently with her during visit. The provider, Evelina Dun, FNP is located in their office at time of visit.  I discussed the limitations, risks, security and privacy concerns of performing an evaluation and management service by telephone and the availability of in person appointments. I also discussed with the patient that there may be a patient responsible charge related to this service. The patient expressed understanding and agreed to proceed.   History and Present Illness:  Pt calls the office today with complaints of blood in her stool three different times. She reports after her BM on 6 days ago she noticed a bright red blood while wiping. Then two & three days ago she noticed the same thing, but some bright red blood in her stool. She denies any constipation, straining, large stool, or hemorrhoids.  Abdominal Pain This is a new problem. The current episode started in the past 7 days. The onset quality is gradual. The problem occurs constantly. The problem has been unchanged. The pain is located in the LUQ. The pain is at a severity of 8/10. The pain is mild. The quality of the pain is sharp. The abdominal pain does not radiate. Pertinent negatives include no belching, constipation, diarrhea, dysuria, frequency, headaches, hematochezia,  hematuria, nausea or vomiting. Nothing aggravates the pain. She has tried acetaminophen for the symptoms. Her past medical history is significant for GERD.  Gastroesophageal Reflux She complains of abdominal pain and heartburn. She reports no belching, no choking, no dysphagia or no nausea. This is a chronic problem. The current episode started more than 1 year ago. The problem occurs occasionally. The problem has been waxing and waning.      Review of Systems  Respiratory: Negative for choking.   Gastrointestinal: Positive for abdominal pain and heartburn. Negative for constipation, diarrhea, dysphagia, hematochezia, nausea and vomiting.  Genitourinary: Negative for dysuria, frequency and hematuria.  Neurological: Negative for headaches.  All other systems reviewed and are negative.    Observations/Objective: No SOB or distress noted   Assessment and Plan: 1. Gastroesophageal reflux disease, esophagitis presence not specified -Diet discussed- Avoid fried, spicy, citrus foods, caffeine and alcohol -Do not eat 2-3 hours before bedtime -Encouraged small frequent meals -Avoid NSAID's - famotidine (PEPCID) 20 MG tablet; Take 1 tablet (20 mg total) by mouth daily.  Dispense: 90 tablet; Refill: 1 - Ambulatory referral to Gastroenterology  2. Blood in stool I believe this is related to hemorrhoids or a tear. She denies constipation.  - Ambulatory referral to Gastroenterology  3. Left upper quadrant pain - Ambulatory referral to Gastroenterology  Force fluids Increase fiber Encourage exercise  Will do referral to GI since ongoing GERD, abdominal pain, and blood in stools Call office if symptoms worsen or do not improve    I discussed the assessment and treatment plan with the patient. The patient was provided an opportunity to ask questions  and all were answered. The patient agreed with the plan and demonstrated an understanding of the instructions.   The patient was advised to  call back or seek an in-person evaluation if the symptoms worsen or if the condition fails to improve as anticipated.  The above assessment and management plan was discussed with the patient. The patient verbalized understanding of and has agreed to the management plan. Patient is aware to call the clinic if symptoms persist or worsen. Patient is aware when to return to the clinic for a follow-up visit. Patient educated on when it is appropriate to go to the emergency department.   Time call ended:  1:57 pm   I provided 16 minutes of non-face-to-face time during this encounter.    Jannifer Rodneyhristy , FNP

## 2019-02-23 ENCOUNTER — Encounter: Payer: Self-pay | Admitting: Internal Medicine

## 2019-03-03 ENCOUNTER — Other Ambulatory Visit: Payer: Self-pay

## 2019-03-04 ENCOUNTER — Ambulatory Visit: Payer: Commercial Managed Care - PPO | Admitting: Family

## 2019-03-04 ENCOUNTER — Encounter: Payer: Self-pay | Admitting: Family

## 2019-03-04 VITALS — BP 127/85 | HR 113 | Temp 99.6°F | Resp 16 | Ht 64.0 in | Wt 215.4 lb

## 2019-03-04 DIAGNOSIS — N926 Irregular menstruation, unspecified: Secondary | ICD-10-CM | POA: Diagnosis not present

## 2019-03-04 DIAGNOSIS — N912 Amenorrhea, unspecified: Secondary | ICD-10-CM

## 2019-03-04 LAB — PREGNANCY, URINE: Preg Test, Ur: NEGATIVE

## 2019-03-04 NOTE — Progress Notes (Signed)
   Subjective:    Patient ID: Dana Lane, female    DOB: January 24, 1996, 23 y.o.   MRN: [id number]  Chief Complaint  Patient presents with  . check for pregnancy    HPI Pt presents to the office today with absent menses. She states her last menstrual cycle was July  21-26. She states she normally has one every 3 weeks. She has taken two pregnancy tests at home that have been negative.   Denies any changes in her stress, activity, or diet. She is sexually active and not currently taking any OC. She states she "some times" uses condoms. She reports intermittent nausea and breast tenderness that started a few days ago.   Denies any cramping, spotting, or discharge.    Review of Systems  Gastrointestinal: Positive for nausea.  Genitourinary: Positive for menstrual problem. Negative for decreased urine volume, vaginal bleeding and vaginal discharge.  All other systems reviewed and are negative.      Objective:   Physical Exam Vitals signs reviewed.  Constitutional:      General: She is not in acute distress.    Appearance: She is well-developed.  HENT:     Head: Normocephalic and atraumatic.  Eyes:     Pupils: Pupils are equal, round, and reactive to light.  Neck:     Musculoskeletal: Normal range of motion and neck supple.     Thyroid: No thyromegaly.  Cardiovascular:     Rate and Rhythm: Normal rate and regular rhythm.     Heart sounds: Normal heart sounds. No murmur.  Pulmonary:     Effort: Pulmonary effort is normal. No respiratory distress.     Breath sounds: Normal breath sounds. No wheezing.  Abdominal:     General: Bowel sounds are normal. There is no distension.     Palpations: Abdomen is soft.     Tenderness: There is no abdominal tenderness.  Musculoskeletal: Normal range of motion.        General: No tenderness.  Skin:    General: Skin is warm and dry.  Neurological:     Mental Status: She is alert and oriented to person, place, and time.     Cranial Nerves: No  cranial nerve deficit.     Deep Tendon Reflexes: Reflexes are normal and symmetric.  Psychiatric:        Behavior: Behavior normal.        Thought Content: Thought content normal.        Judgment: Judgment normal.     BP 127/85   Pulse (!) 113   Temp 99.6 F (37.6 C) (Temporal)   Resp 16   Ht '5\' 4"'$  (1.626 m)   Wt 215 lb 6.4 oz (97.7 kg)   LMP 01/11/2019   SpO2 97%   BMI 36.97 kg/m      Assessment & Plan:  Sherika Kubicki comes in today with chief complaint of check for pregnancy   Diagnosis and orders addressed:  1. Irregular menses - Pregnancy, urine - CMP14+EGFR - CBC with Differential/Platelet - TSH - Beta hCG quant (ref lab)  2. Amenorrhea Labs pending If all normal, may need vaginal [place] Safe sex discussed  - CMP14+EGFR - CBC with Differential/Platelet - TSH - Beta hCG quant (ref lab)     Evelina Dun, FNP

## 2019-03-04 NOTE — Patient Instructions (Signed)
Primary Amenorrhea  Primary amenorrhea is when a female has not started having periods by the age of 15 years. What are the causes? This condition may be caused by:  An abnormal chromosome that causes the ovaries not to work properly (common).  Malnutrition.  Low blood sugar (hypoglycemia).  Polycystic ovary syndrome.  Being born without a vagina, uterus, or ovaries.  Extreme obesity.  Cystic fibrosis.  Drastic weight loss.  Over-exercising that leads to a loss of body fat.  Pituitary gland tumor in the brain.  Long-term illnesses.  Cushing disease.  A thyroid disease, such as hypothyroidism or hyperthyroidism.  A part of the brain called the hypothalamus not working normally.  Premature ovarian failure. What are the signs or symptoms? The main symptom of this condition is not having had a period by age 45 years. Other symptoms include:  Discharge from the breasts.  Hot flashes.  Adult acne.  Facial or chest hair.  Headaches.  Impaired vision.  Recent excessive stress.  Changes in weight, diet, or exercise patterns. How is this diagnosed? This condition may be diagnosed based on:  Your medical history.  A physical exam.  Tests, such as: ? Blood tests. ? Urine tests. How is this treated? Treatment for this condition depends on the cause. For example, an absence of sex organs will require surgery to correct the problem. Others causes may respond when treated with medicine. Follow these instructions at home:  Maintain a healthy diet.  Maintain a healthy weight.  Exercise regularly but not excessively.  Take over-the-counter and prescription medicines only as told by your health care provider.  Keep all follow-up visits as told by your health care provider. This is important. Contact a health care provider if:  Your body is not developing at the level of your peers.  You have pelvic pain.  You gain an unusual amount of weight.  You have  an unusual amount of hair growth. Summary  Primary amenorrhea is when a female has not started having periods by the age of 43 years.  This condition has many causes, including abnormal ovaries, obesity, extreme weight loss.  Contact a health care provider if your body is not developing at the level of your peers. This information is not intended to replace advice given to you by your health care provider. Make sure you discuss any questions you have with your health care provider. Document Released: 06/09/2005 Document Revised: 11/19/2017 Document Reviewed: 08/26/2016 Elsevier Patient Education  [place].

## 2019-03-05 LAB — CBC WITH DIFFERENTIAL/PLATELET
Basophils Absolute: 0.1 10*3/uL (ref 0.0–0.2)
Basos: 1 %
EOS (ABSOLUTE): 0.1 10*3/uL (ref 0.0–0.4)
Eos: 2 %
Hematocrit: 35.4 % (ref 34.0–46.6)
Hemoglobin: 11.3 g/dL (ref 11.1–15.9)
Immature Grans (Abs): 0 10*3/uL (ref 0.0–0.1)
Immature Granulocytes: 0 %
Lymphocytes Absolute: 2.4 10*3/uL (ref 0.7–3.1)
Lymphs: 35 %
MCH: 26 pg — ABNORMAL LOW (ref 26.6–33.0)
MCHC: 31.9 g/dL (ref 31.5–35.7)
MCV: 82 fL (ref 79–97)
Monocytes Absolute: 0.5 10*3/uL (ref 0.1–0.9)
Monocytes: 7 %
Neutrophils Absolute: 3.8 10*3/uL (ref 1.4–7.0)
Neutrophils: 55 %
Platelets: 343 10*3/uL (ref 150–450)
RBC: 4.34 x10E6/uL (ref 3.77–5.28)
RDW: 15.4 % (ref 11.7–15.4)
WBC: 6.8 10*3/uL (ref 3.4–10.8)

## 2019-03-05 LAB — CMP14+EGFR
ALT: 25 IU/L (ref 0–32)
AST: 18 IU/L (ref 0–40)
Albumin/Globulin Ratio: 2 (ref 1.2–2.2)
Albumin: 4.3 g/dL (ref 3.9–5.0)
Alkaline Phosphatase: 76 IU/L (ref 39–117)
BUN/Creatinine Ratio: 13 (ref 9–23)
BUN: 11 mg/dL (ref 6–20)
Bilirubin Total: <0.2 mg/dL (ref 0.0–1.2)
CO2: 22 mmol/L (ref 20–29)
Calcium: 8.9 mg/dL (ref 8.7–10.2)
Chloride: 102 mmol/L (ref 96–106)
Creatinine, Ser: 0.87 mg/dL (ref 0.57–1.00)
GFR calc Af Amer: 107 mL/min/{1.73_m2} (ref >59)
GFR calc non Af Amer: 93 mL/min/{1.73_m2} (ref >59)
Globulin, Total: 2.1 g/dL (ref 1.5–4.5)
Glucose: 114 mg/dL — ABNORMAL HIGH (ref 65–99)
Potassium: 4.3 mmol/L (ref 3.5–5.2)
Sodium: 139 mmol/L (ref 134–144)
Total Protein: 6.4 g/dL (ref 6.0–8.5)

## 2019-03-05 LAB — BETA HCG QUANT (REF LAB): hCG Quant: <1 m[IU]/mL

## 2019-03-05 LAB — TSH: TSH: 1.13 u[IU]/mL (ref 0.450–4.500)

## 2019-03-16 NOTE — Progress Notes (Signed)
Referring Provider: Sharion Balloon, FNP Primary Care Physician:  Sharion Balloon, FNP Primary Gastroenterologist:  Dr. Gala Romney  Chief Complaint  Patient presents with  . Abdominal Pain    luq  . Blood In Stools  . Gastroesophageal Reflux    HPI:   Dana Lane is a 23 y.o. female presenting today at the request of Sharion Balloon, FNP for GERD, blood in stool, and LUQ pain.   Labs on 03/04/19: CMP within normal limits other than elevated glucose. CBC within normal limits. Hemoglobin on the low end of normal at 11.3 and MCH 26.0. Hemoglobin is down compared to Jan 2020 at 13.1. Iron panel in January 2020 was normal. TSH normal. Beta HCG and urine pregnancy negative on 03/04/19.  Today:  GERD: present for years. Certain things she eats or drinks will come back up in her throat. Usually ice cream, milk. Would get worse with spicy foods, but she has cut this out. [employer] foods will make make her feel nauseous and have a BM (loose stools). Taking omeprazole daily. This keeps symptoms well controlled. GERD symptoms not worsening.   Will have sharp pain in the epigastric area and LUQ. Present for a few weeks. Occurs twice a week. No identified triggers. Not at a particular time of the day. No pain like this before. Lasts sometimes all day. 8/10. Other times will go away in an hour or two. Has taken tylenol. This did not help. Used to take [employer], but non in over 1 year. No other NSAIDs. No black stools. Pain in the RUQ started yesterday. This is constant. Pain is 7/10.   No nausea or vomiting. No dysphagia. No lower abdominal pain. BMs about once a week unless eats something she is allergic to, greasy food, or has a virus. Has not tried anything to help with decreased frequency of BMs. Stools are soft and formed. Not hard. No straining.  No diarrhea. This is her baseline.   Blood in the stool for 1 week a few weeks ago. On toilet tissue and in toilet water. Occurred about  3 times. No  bleeding since. No hemorrhoids. No rectal pain, itching or burning.   Twin sister with recent diagnosis of gallbladder cancer.   Past Medical History:  Diagnosis Date  . Ankle sprain    right  . Depression   . GERD (gastroesophageal reflux disease)   . Tonsillitis     Past Surgical History:  Procedure Laterality Date  . EYE SURGERY    . TONSILLECTOMY AND ADENOIDECTOMY Bilateral 11/16/2018   Procedure: TONSILLECTOMY AND ADENOIDECTOMY;  Surgeon: Leta Baptist, MD;  Location: [place];  Service: ENT;  Laterality: Bilateral;    Current Outpatient Medications  Medication Sig Dispense Refill  . fluticasone (FLONASE) 50 MCG/ACT nasal spray Place into both nostrils as needed for allergies or rhinitis.    Marland Kitchen venlafaxine XR (EFFEXOR-XR) 75 MG 24 hr capsule TAKE 1 CAPSULE BY MOUTH  DAILY WITH BREAKFAST 90 capsule 1  . omeprazole (PRILOSEC) 40 MG capsule Take 1 capsule (40 mg total) by mouth 2 (two) times daily before a meal. 60 capsule 5  . polyethylene glycol-electrolytes (NULYTELY/GOLYTELY) 420 g solution Take 4,000 mLs by mouth once for 1 dose. 4000 mL 0   No current facility-administered medications for this visit.     Allergies as of 03/17/2019 - Review Complete 03/17/2019  Allergen Reaction Noted  . Poison oak extract [poison oak extract] Itching and Rash 01/09/2016  . Chocolate Itching  and Swelling 12/07/2014  . Eggs or egg-derived products Diarrhea and Other (See Comments) 12/07/2014    Family History  Problem Relation Age of Onset  . Diabetes Mother   . Colon cancer Mother        ? patient isn't sure. Thinks mom had this when she was younger (<50 y.o.)  . Cancer Father        prostate  . Depression Sister   . Cancer Sister 25       gallbladder  . Colon polyps Neg Hx     Social History   Socioeconomic History  . Marital status: Single    Spouse name: Not on file  . Number of children: Not on file  . Years of education: 43  . Highest education level: Not  on file  Occupational History  . Not on file  Social Needs  . Financial resource strain: Not on file  . Food insecurity    Worry: Not on file    Inability: Not on file  . Transportation needs    Medical: Not on file    Non-medical: Not on file  Tobacco Use  . Smoking status: Never Smoker  . Smokeless tobacco: Never Used  Substance and Sexual Activity  . Alcohol use: Not Currently    Comment: occasionally  . Drug use: No  . Sexual activity: Yes    Birth control/protection: Condom  Lifestyle  . Physical activity    Days per week: Not on file    Minutes per session: Not on file  . Stress: Not on file  Relationships  . Social Musician on phone: Not on file    Gets together: Not on file    Attends religious service: Not on file    Active member of club or organization: Not on file    Attends meetings of clubs or organizations: Not on file    Relationship status: Not on file  . Intimate partner violence    Fear of current or ex partner: Not on file    Emotionally abused: Not on file    Physically abused: Not on file    Forced sexual activity: Not on file  Other Topics Concern  . Not on file  Social History Narrative  . Not on file    Review of Systems: Gen: Denies any fever, chills, fatigue, unintentional weight loss CV: Denies chest pain, heart palpitations, peripheral edema Resp: Denies shortness of breath or cough GI: See HPI GU : Denies urinary burning, urinary frequency, urinary hesitancy MS: Denies joint pain, muscle weakness Derm: Denies rash Psych: Denies depression, anxiety Heme: See HPI  Physical Exam: BP 127/90   Pulse 97   Temp (!) 96.9 F (36.1 C) (Temporal)   Ht 5' 4.5" (1.638 m)   Wt 215 lb 9.6 oz (97.8 kg)   LMP 01/12/2019   BMI 36.44 kg/m  General:   Alert and oriented. Pleasant and cooperative. Well-nourished and well-developed.  Head:  Normocephalic and atraumatic. Eyes:  Without icterus, sclera clear and conjunctiva pink.   Ears:  Normal auditory acuity. Nose:  No deformity, discharge,  or lesions. Lungs:  Clear to auscultation bilaterally. No wheezes, rales, or rhonchi. No distress.  Heart:  S1, S2 present without murmurs appreciated.  Abdomen:  +BS, soft, non-distended. Mild tenderness to palpation in the upper abdomen. Greatest in the RUQ and just left of the epigastric area. No significant Murphy sign. No HSM noted. No guarding or rebound. No masses appreciated.  Rectal:  Deferred  Msk:  Symmetrical without gross deformities. Normal posture. Extremities:  Without edema. Neurologic:  Alert and  oriented x4;  grossly normal neurologically. Skin:  Intact without significant lesions or rashes. Psych:  Normal mood and affect.

## 2019-03-17 ENCOUNTER — Ambulatory Visit (INDEPENDENT_AMBULATORY_CARE_PROVIDER_SITE_OTHER): Payer: Commercial Managed Care - PPO | Admitting: Gastroenterology

## 2019-03-17 ENCOUNTER — Encounter: Payer: Self-pay | Admitting: *Deleted

## 2019-03-17 ENCOUNTER — Encounter: Payer: Self-pay | Admitting: Gastroenterology

## 2019-03-17 ENCOUNTER — Other Ambulatory Visit: Payer: Self-pay | Admitting: *Deleted

## 2019-03-17 ENCOUNTER — Telehealth: Payer: Self-pay | Admitting: *Deleted

## 2019-03-17 ENCOUNTER — Other Ambulatory Visit: Payer: Self-pay

## 2019-03-17 VITALS — BP 127/90 | HR 97 | Temp 96.9°F | Ht 64.5 in | Wt 215.6 lb

## 2019-03-17 DIAGNOSIS — K625 Hemorrhage of anus and rectum: Secondary | ICD-10-CM

## 2019-03-17 DIAGNOSIS — R1010 Upper abdominal pain, unspecified: Secondary | ICD-10-CM

## 2019-03-17 DIAGNOSIS — K219 Gastro-esophageal reflux disease without esophagitis: Secondary | ICD-10-CM

## 2019-03-17 HISTORY — DX: Upper abdominal pain, unspecified: R10.10

## 2019-03-17 HISTORY — DX: Hemorrhage of anus and rectum: K62.5

## 2019-03-17 MED ORDER — PEG 3350-KCL-NA BICARB-NACL 420 G PO SOLR: 4000.0000 mL | Freq: Once | ORAL | 0 refills | Status: AC

## 2019-03-17 MED ORDER — OMEPRAZOLE 40 MG PO CPDR: 40.0000 mg | DELAYED_RELEASE_CAPSULE | Freq: Two times a day (BID) | ORAL | 5 refills | Status: DC

## 2019-03-17 NOTE — Telephone Encounter (Signed)
Comments/Confirmation No: [id number] AUTH NOT REQQUIRED

## 2019-03-17 NOTE — Assessment & Plan Note (Addendum)
Addressed under pain of upper abdomen. 

## 2019-03-17 NOTE — Telephone Encounter (Signed)
PA submitted via [facility] website. PA pending. Case # [id number]

## 2019-03-17 NOTE — Assessment & Plan Note (Addendum)
23 y.o. female with past medical history significant for GERD that has been fairly well controlled on omeprazole 40 mg daily for several years presenting with a few week history of epigastric and LUQ pain. Occurs about twice a week. No identified triggers. May last a couple hours or all day (8/10). No recent NSAID use. Now also with RUQ pain that started yesterday and is constant 7/10. Denies nausea, vomiting, worsening GERD, dysphagia, black stools, fever, or chills. Admits to 3 episodes of brbpr a few weeks ago. Soft formed stools once a week which is her baseline. No change in abdominal pain with BMs. No alcohol or drug use. Abdominal exam with mild tenderness to palpation of the upper abdomen. Recent labs on 03/04/19 with CMP essentially normal, CBC with normal hemoglobin 11.3 (down from 13.1 in Jan 2020) and slightly low MCH at 26.0. Reports family history of twin sister recently diagnosed with gallbladder cancer.   Differentials include gastritis, esophagitis, duodenitis, PUD, ? H. Pylori. Possible biliary etiology. Less likely pancreatitis.   Obtain Abdominal US.  Check Lipase.  Recheck CBC and iron panel. Increase Omeprazole to 40 mg BID Avoid all NSAIDs GERD diet. Handout provided.  Avoid known reflux triggers.  Add possible EGD to planned TCS (for rectal bleeding). If abdominal US is unrevealing and patient continues with epigastric pain despite increasing omeprazole, she will need EGD. Also if labs reveal IDA, she will need EGD. The risks, benefits, and alternatives have been discussed in detail with patient. They have stated understanding and desire to proceed.  Follow-up after procedures.   Addendum: Evidence of new IDA on labs. Hemoglobin has dropped slightly. Now 11.1. Patient needs EGD with TCS for IDA.

## 2019-03-17 NOTE — Assessment & Plan Note (Addendum)
Patient reports bright red blood on toilet tissue and in toilet water x 3 a few weeks ago. No known hemorrhoids. No hard stools, straining during BMs, rectal pain, burning, or itching. BMs soft and formed about once a week at baseline. Reports she thinks mom had colon cancer prior to age 25. Denies lower abdominal pain. Reports upper abdominal pain as described below and GERD symptoms that are fairly well controlled on omeprazole. No unintentional weight loss. Hemoglobin 11.3 on 03/04/19 with slightly low MCH 26.0. Of note, hemoglobin is down from 13.1 in Jan 2020. Iron panel normal in January.   Proceed with TCS with propofol with Dr. Gala Romney to evaluate rectal bleeding. The risks, benefits, and alternatives have been discussed in detail with patient. They have stated understanding and desire to proceed.  Will also recheck CBC and iron panel.  Add Benefiber or metamucil to help with stool regularity.   Follow-up after procedure.

## 2019-03-17 NOTE — Patient Instructions (Addendum)
Please have ultrasound of your abdomen completed.  Please have labs completed.  You can have these completed at the time of your ultrasound.  Please increase omeprazole to 40 mg twice a day.  30 minutes before breakfast and 30 minutes before dinner.  Please follow a GERD diet (see handout below) and avoid dairy products as these are known triggers of reflux for you.   Avoid all NSAIDs which include ibuprofen, Aleve, Advil, Goody powders, and naproxen.  We will get you scheduled for a colonoscopy with possible upper endoscopy (based on your symptoms and lab work) with Dr. Gala Romney in the near future.  Please add benefiber or metamucil to see if this helps with bowel regularity.   We will call you with the results.  We will also plan to call you in 2 weeks for a progress report.  We will plan to follow-up with you in the office after your procedures.  Aliene Altes, PA-C [facility]     Food Choices for Gastroesophageal Reflux Disease, Adult When you have gastroesophageal reflux disease (GERD), the foods you eat and your eating habits are very important. Choosing the right foods can help ease your discomfort. Think about working with a nutrition specialist (dietitian) to help you make good choices. What are tips for following this plan?  Meals  Choose healthy foods that are low in fat, such as fruits, vegetables, whole grains, low-fat dairy products, and lean meat, fish, and poultry.  Eat small meals often instead of 3 large meals a day. Eat your meals slowly, and in a place where you are relaxed. Avoid bending over or lying down until 2-3 hours after eating.  Avoid eating meals 2-3 hours before bed.  Avoid drinking a lot of liquid with meals.  Cook foods using methods other than frying. Bake, grill, or broil food instead.  Avoid or limit: ? Chocolate. ? Peppermint or spearmint. ? Alcohol. ? Pepper. ? Black and decaffeinated coffee. ? Black and decaffeinated  tea. ? Bubbly (carbonated) soft drinks. ? Caffeinated energy drinks and soft drinks.  Limit high-fat foods such as: ? Fatty meat or fried foods. ? Whole milk, cream, butter, or ice cream. ? Nuts and nut butters. ? Pastries, donuts, and sweets made with butter or shortening.  Avoid foods that cause symptoms. These foods may be different for everyone. Common foods that cause symptoms include: ? Tomatoes. ? Oranges, lemons, and limes. ? Peppers. ? Spicy food. ? Onions and garlic. ? Vinegar. Lifestyle  Maintain a healthy weight. Ask your doctor what weight is healthy for you. If you need to lose weight, work with your doctor to do so safely.  Exercise for at least 30 minutes for 5 or more days each week, or as told by your doctor.  Wear loose-fitting clothes.  Do not smoke. If you need help quitting, ask your doctor.  Sleep with the head of your bed higher than your feet. Use a wedge under the mattress or blocks under the bed frame to raise the head of the bed. Summary  When you have gastroesophageal reflux disease (GERD), food and lifestyle choices are very important in easing your symptoms.  Eat small meals often instead of 3 large meals a day. Eat your meals slowly, and in a place where you are relaxed.  Limit high-fat foods such as fatty meat or fried foods.  Avoid bending over or lying down until 2-3 hours after eating.  Avoid peppermint and spearmint, caffeine, alcohol, and chocolate. This information is  not intended to replace advice given to you by your health care provider. Make sure you discuss any questions you have with your health care provider. Document Released: 12/09/2011 Document Revised: 09/30/2018 Document Reviewed: 07/15/2016 Elsevier Patient Education  2020 [employer].

## 2019-03-18 ENCOUNTER — Other Ambulatory Visit: Payer: Self-pay

## 2019-03-21 ENCOUNTER — Ambulatory Visit (INDEPENDENT_AMBULATORY_CARE_PROVIDER_SITE_OTHER): Payer: Commercial Managed Care - PPO | Admitting: Family

## 2019-03-21 ENCOUNTER — Encounter: Payer: Self-pay | Admitting: Family

## 2019-03-21 ENCOUNTER — Other Ambulatory Visit: Payer: Self-pay

## 2019-03-21 ENCOUNTER — Encounter: Payer: Commercial Managed Care - PPO | Admitting: Family

## 2019-03-21 VITALS — BP 126/86 | HR 104 | Temp 98.6°F | Ht 64.5 in | Wt 216.0 lb

## 2019-03-21 DIAGNOSIS — F331 Major depressive disorder, recurrent, moderate: Secondary | ICD-10-CM | POA: Diagnosis not present

## 2019-03-21 DIAGNOSIS — E669 Obesity, unspecified: Secondary | ICD-10-CM | POA: Diagnosis not present

## 2019-03-21 DIAGNOSIS — Z9189 Other specified personal risk factors, not elsewhere classified: Secondary | ICD-10-CM

## 2019-03-21 DIAGNOSIS — Z0000 Encounter for general adult medical examination without abnormal findings: Secondary | ICD-10-CM

## 2019-03-21 DIAGNOSIS — R1010 Upper abdominal pain, unspecified: Secondary | ICD-10-CM

## 2019-03-21 DIAGNOSIS — K219 Gastro-esophageal reflux disease without esophagitis: Secondary | ICD-10-CM | POA: Diagnosis not present

## 2019-03-21 DIAGNOSIS — Z114 Encounter for screening for human immunodeficiency virus [HIV]: Secondary | ICD-10-CM

## 2019-03-21 DIAGNOSIS — Z0001 Encounter for general adult medical examination with abnormal findings: Secondary | ICD-10-CM

## 2019-03-21 DIAGNOSIS — N912 Amenorrhea, unspecified: Secondary | ICD-10-CM

## 2019-03-21 MED ORDER — VENLAFAXINE HCL ER 75 MG PO CP24: ORAL_CAPSULE | ORAL | 1 refills | Status: DC

## 2019-03-21 MED ORDER — OMEPRAZOLE 40 MG PO CPDR: 40.0000 mg | DELAYED_RELEASE_CAPSULE | Freq: Two times a day (BID) | ORAL | 5 refills | Status: DC

## 2019-03-21 NOTE — Progress Notes (Signed)
Subjective:    Patient ID: Dana Lane, female    DOB: [birth year], 23 y.o.   MRN: [id number]  Chief Complaint  Patient presents with  . Annual Exam   Pt presents to the office today for CPE with pap. She  states she still has not started her menstrual cycle yet. She reports her last menstrual cycle was in 01/16/19. She has had several negative pregnancy tests.   She saw her GI and is scheduled for abdominal US on 03/23/19 for chronic upper abdominal pain. She states she will be set up for a colonoscopy if the [place] and lab work is negative. She continues to have constant sharp pain of 7 out 10. She can not recall anything that makes it worse or better.  Gastroesophageal Reflux She complains of abdominal pain. She reports no belching, no chest pain, no coughing, no dysphagia or no heartburn. This is a chronic problem. The current episode started more than 1 year ago. The problem occurs occasionally. The problem has been waxing and waning. Risk factors include obesity. She has tried a PPI for the symptoms. The treatment provided moderate relief.  Depression        This is a chronic problem.  The current episode started more than 1 year ago.   The onset quality is gradual.   Associated symptoms include irritable, restlessness and sad.  Associated symptoms include no helplessness and no hopelessness.  Past treatments include SNRIs - Serotonin and norepinephrine reuptake inhibitors. Gynecologic Exam The patient's pertinent negatives include no genital itching, genital lesions, genital odor, vaginal bleeding or vaginal discharge. The patient is experiencing no pain. Associated symptoms include abdominal pain. Her menstrual history has been irregular.      Review of Systems  Respiratory: Negative for cough.   Cardiovascular: Negative for chest pain.  Gastrointestinal: Positive for abdominal pain. Negative for dysphagia and heartburn.  Genitourinary: Negative for vaginal discharge.   Psychiatric/Behavioral: Positive for depression.  All other systems reviewed and are negative.   Family History  Problem Relation Age of Onset  . Diabetes Mother   . Colon cancer Mother        ? patient isn't sure. 58 mom had this when she was younger (<50 y.o.)  . Cancer Father        prostate  . Depression Sister   . Cancer Sister 25       gallbladder  . Colon polyps Neg Hx     Social History   Socioeconomic History  . Marital status: Single    Spouse name: Not on file  . Number of children: Not on file  . Years of education: 45  . Highest education level: Not on file  Occupational History  . Not on file  Social Needs  . Financial resource strain: Not on file  . Food insecurity    Worry: Not on file    Inability: Not on file  . Transportation needs    Medical: Not on file    Non-medical: Not on file  Tobacco Use  . Smoking status: Never Smoker  . Smokeless tobacco: Never Used  Substance and Sexual Activity  . Alcohol use: Not Currently  . Drug use: No  . Sexual activity: Yes    Birth control/protection: Condom  Lifestyle  . Physical activity    Days per week: Not on file    Minutes per session: Not on file  . Stress: Not on file  Relationships  . Social connections    Talks  on phone: Not on file    Gets together: Not on file    Attends religious service: Not on file    Active member of club or organization: Not on file    Attends meetings of clubs or organizations: Not on file    Relationship status: Not on file  Other Topics Concern  . Not on file  Social History Narrative  . Not on file       Objective:   Physical Exam Vitals signs reviewed.  Constitutional:      General: She is irritable. She is not in acute distress.    Appearance: She is well-developed. She is obese.  HENT:     Head: Normocephalic and atraumatic.     Right Ear: Tympanic membrane normal.     Left Ear: Tympanic membrane normal.  Eyes:     Pupils: Pupils are equal,  round, and reactive to light.  Neck:     Musculoskeletal: Normal range of motion and neck supple.     Thyroid: No thyromegaly.  Cardiovascular:     Rate and Rhythm: Normal rate and regular rhythm.     Heart sounds: Normal heart sounds. No murmur.  Pulmonary:     Effort: Pulmonary effort is normal. No respiratory distress.     Breath sounds: Normal breath sounds. No wheezing.  Chest:     Breasts:        Right: No swelling, bleeding, inverted nipple, mass, nipple discharge, skin change or tenderness.        Left: No swelling, bleeding, inverted nipple, mass, nipple discharge, skin change or tenderness.  Abdominal:     General: Bowel sounds are normal. There is no distension.     Palpations: Abdomen is soft.     Tenderness: There is abdominal tenderness (mild RUQ ).  Genitourinary:    General: Normal vulva.     Comments: Bimanual exam- no adnexal masses or tenderness, ovaries nonpalpable   Cervix parous and pink- No discharge  Musculoskeletal: Normal range of motion.        General: No tenderness.  Skin:    General: Skin is warm and dry.  Neurological:     Mental Status: She is alert and oriented to person, place, and time.     Cranial Nerves: No cranial nerve deficit.     Deep Tendon Reflexes: Reflexes are normal and symmetric.  Psychiatric:        Behavior: Behavior normal.        Thought Content: Thought content normal.        Judgment: Judgment normal.       BP 126/86   Pulse (!) 104   Temp 98.6 F (37 C) (Temporal)   Ht 5' 4.5" (1.638 m)   Wt 216 lb (98 kg)   LMP 01/11/2019   SpO2 98%   BMI 36.50 kg/m      Assessment & Plan:  Dana Lane comes in today with chief complaint of Annual Exam   Diagnosis and orders addressed:  1. Gastroesophageal reflux disease, esophagitis presence not specified - CMP14+EGFR - CBC with Differential/Platelet - omeprazole (PRILOSEC) 40 MG capsule; Take 1 capsule (40 mg total) by mouth 2 (two) times daily before a meal.   Dispense: 60 capsule; Refill: 5  2. MDD (major depressive disorder), recurrent episode, moderate (HCC) - CMP14+EGFR - CBC with Differential/Platelet - venlafaxine XR (EFFEXOR-XR) 75 MG 24 hr capsule; TAKE 1 CAPSULE BY MOUTH  DAILY WITH BREAKFAST  Dispense: 90 capsule; Refill: 1  3.  Obesity (BMI 30-39.9) - CMP14+EGFR - CBC with Differential/Platelet  4. Pain of upper abdomen - CMP14+EGFR - CBC with Differential/Platelet - omeprazole (PRILOSEC) 40 MG capsule; Take 1 capsule (40 mg total) by mouth 2 (two) times daily before a meal.  Dispense: 60 capsule; Refill: 5  5. Annual physical exam - CMP14+EGFR - CBC with Differential/Platelet - TSH - IGP, CtNg, rfx Aptima HPV ASCU - HIV Antibody (routine testing w rflx)  6. GYN exam for high-risk Medicare patient - CMP14+EGFR - CBC with Differential/Platelet - IGP, CtNg, rfx Aptima HPV ASCU - US Pelvic Complete With Transvaginal; Future  7. Amenorrhea -Will order transvaginal US. May need referral to GYN - CMP14+EGFR - CBC with Differential/Platelet - US Pelvic Complete With Transvaginal; Future  8. Screening for HIV (human immunodeficiency virus) - HIV Antibody (routine testing w rflx)   Labs pending Health Maintenance reviewed Diet and exercise encouraged  Follow up plan: 6 months, keep GI appts.    Evelina Dun, FNP

## 2019-03-21 NOTE — Patient Instructions (Signed)
Abdominal Pain, Adult Abdominal pain can be caused by many things. Often, abdominal pain is not serious and it gets better with no treatment or by being treated at home. However, sometimes abdominal pain is serious. Your health care provider will do a medical history and a physical exam to try to determine the cause of your abdominal pain. Follow these instructions at home:  Take over-the-counter and prescription medicines only as told by your health care provider. Do not take a laxative unless told by your health care provider.  Drink enough fluid to keep your urine clear or pale yellow.  Watch your condition for any changes.  Keep all follow-up visits as told by your health care provider. This is important. Contact a health care provider if:  Your abdominal pain changes or gets worse.  You are not hungry or you lose weight without trying.  You are constipated or have diarrhea for more than 2-3 days.  You have pain when you urinate or have a bowel movement.  Your abdominal pain wakes you up at night.  Your pain gets worse with meals, after eating, or with certain foods.  You are throwing up and cannot keep anything down.  You have a fever. Get help right away if:  Your pain does not go away as soon as your health care provider told you to expect.  You cannot stop throwing up.  Your pain is only in areas of the abdomen, such as the right side or the left lower portion of the abdomen.  You have bloody or black stools, or stools that look like tar.  You have severe pain, cramping, or bloating in your abdomen.  You have signs of dehydration, such as: ? Dark urine, very little urine, or no urine. ? Cracked lips. ? Dry mouth. ? Sunken eyes. ? Sleepiness. ? Weakness. This information is not intended to replace advice given to you by your health care provider. Make sure you discuss any questions you have with your health care provider. Document Released: 03/19/2005 Document  Revised: 12/28/2015 Document Reviewed: 11/21/2015 Elsevier Interactive Patient Education  2020 [employer]  

## 2019-03-22 LAB — CMP14+EGFR
ALT: 23 IU/L (ref 0–32)
AST: 13 IU/L (ref 0–40)
Albumin/Globulin Ratio: 1.8 (ref 1.2–2.2)
Albumin: 4.3 g/dL (ref 3.9–5.0)
Alkaline Phosphatase: 96 IU/L (ref 39–117)
BUN/Creatinine Ratio: 16 (ref 9–23)
BUN: 13 mg/dL (ref 6–20)
Bilirubin Total: 0.2 mg/dL (ref 0.0–1.2)
CO2: 25 mmol/L (ref 20–29)
Calcium: 9.3 mg/dL (ref 8.7–10.2)
Chloride: 99 mmol/L (ref 96–106)
Creatinine, Ser: 0.8 mg/dL (ref 0.57–1.00)
GFR calc Af Amer: 119 mL/min/{1.73_m2} (ref >59)
GFR calc non Af Amer: 103 mL/min/{1.73_m2} (ref >59)
Globulin, Total: 2.4 g/dL (ref 1.5–4.5)
Glucose: 101 mg/dL — ABNORMAL HIGH (ref 65–99)
Potassium: 4.7 mmol/L (ref 3.5–5.2)
Sodium: 137 mmol/L (ref 134–144)
Total Protein: 6.7 g/dL (ref 6.0–8.5)

## 2019-03-22 LAB — CBC WITH DIFFERENTIAL/PLATELET
Basophils Absolute: 0.1 10*3/uL (ref 0.0–0.2)
Basos: 1 %
EOS (ABSOLUTE): 0.2 10*3/uL (ref 0.0–0.4)
Eos: 2 %
Hematocrit: 36 % (ref 34.0–46.6)
Hemoglobin: 11.6 g/dL (ref 11.1–15.9)
Immature Grans (Abs): 0 10*3/uL (ref 0.0–0.1)
Immature Granulocytes: 0 %
Lymphocytes Absolute: 2.7 10*3/uL (ref 0.7–3.1)
Lymphs: 32 %
MCH: 25.1 pg — ABNORMAL LOW (ref 26.6–33.0)
MCHC: 32.2 g/dL (ref 31.5–35.7)
MCV: 78 fL — ABNORMAL LOW (ref 79–97)
Monocytes Absolute: 0.4 10*3/uL (ref 0.1–0.9)
Monocytes: 5 %
Neutrophils Absolute: 5.1 10*3/uL (ref 1.4–7.0)
Neutrophils: 60 %
Platelets: 397 10*3/uL (ref 150–450)
RBC: 4.62 x10E6/uL (ref 3.77–5.28)
RDW: 15.6 % — ABNORMAL HIGH (ref 11.7–15.4)
WBC: 8.4 10*3/uL (ref 3.4–10.8)

## 2019-03-22 LAB — TSH: TSH: 1.15 u[IU]/mL (ref 0.450–4.500)

## 2019-03-22 LAB — HIV ANTIBODY (ROUTINE TESTING W REFLEX): HIV Screen 4th Generation wRfx: NONREACTIVE

## 2019-03-23 ENCOUNTER — Ambulatory Visit (HOSPITAL_COMMUNITY)
Admission: RE | Admit: 2019-03-23 | Discharge: 2019-03-23 | Disposition: A | Discharge status: Home / Self Care | Payer: Commercial Managed Care - PPO | Source: Ambulatory Visit | Attending: Gastroenterology | Admitting: Gastroenterology

## 2019-03-23 ENCOUNTER — Other Ambulatory Visit: Payer: Self-pay

## 2019-03-23 ENCOUNTER — Ambulatory Visit (HOSPITAL_COMMUNITY): Payer: Commercial Managed Care - PPO

## 2019-03-23 DIAGNOSIS — R1010 Upper abdominal pain, unspecified: Secondary | ICD-10-CM | POA: Diagnosis present

## 2019-03-24 LAB — IGP, CTNG, RFX APTIMA HPV ASCU
Chlamydia, Nuc. Acid Amp: NEGATIVE
Gonococcus by Nucleic Acid Amp: NEGATIVE

## 2019-03-24 LAB — CBC WITH DIFFERENTIAL/PLATELET
Absolute Monocytes: 432 cells/uL (ref 200–950)
Basophils Absolute: 72 cells/uL (ref 0–200)
Basophils Relative: 1 %
Eosinophils Absolute: 151 cells/uL (ref 15–500)
Eosinophils Relative: 2.1 %
HCT: 34.5 % — ABNORMAL LOW (ref 35.0–45.0)
Hemoglobin: 11.1 g/dL — ABNORMAL LOW (ref 11.7–15.5)
Lymphs Abs: 2974 cells/uL (ref 850–3900)
MCH: 25.6 pg — ABNORMAL LOW (ref 27.0–33.0)
MCHC: 32.2 g/dL (ref 32.0–36.0)
MCV: 79.5 fL — ABNORMAL LOW (ref 80.0–100.0)
MPV: 10.8 fL (ref 7.5–12.5)
Monocytes Relative: 6 %
Neutro Abs: 3571 cells/uL (ref 1500–7800)
Neutrophils Relative %: 49.6 %
Platelets: 373 10*3/uL (ref 140–400)
RBC: 4.34 10*6/uL (ref 3.80–5.10)
RDW: 15.8 % — ABNORMAL HIGH (ref 11.0–15.0)
Total Lymphocyte: 41.3 %
WBC: 7.2 10*3/uL (ref 3.8–10.8)

## 2019-03-24 LAB — IRON,TIBC AND FERRITIN PANEL
%SAT: 8 % (calc) — ABNORMAL LOW (ref 16–45)
Ferritin: 9 ng/mL — ABNORMAL LOW (ref 16–154)
Iron: 31 ug/dL — ABNORMAL LOW (ref 40–190)
TIBC: 404 mcg/dL (calc) (ref 250–450)

## 2019-03-24 LAB — LIPASE: Lipase: 33 U/L (ref 7–60)

## 2019-03-28 ENCOUNTER — Other Ambulatory Visit: Payer: Self-pay

## 2019-03-28 ENCOUNTER — Ambulatory Visit (HOSPITAL_COMMUNITY)
Admission: RE | Admit: 2019-03-28 | Discharge: 2019-03-28 | Disposition: A | Discharge status: Home / Self Care | Payer: Commercial Managed Care - PPO | Source: Ambulatory Visit | Attending: Family | Admitting: Family

## 2019-03-28 DIAGNOSIS — Z9189 Other specified personal risk factors, not elsewhere classified: Secondary | ICD-10-CM | POA: Diagnosis not present

## 2019-03-28 DIAGNOSIS — N912 Amenorrhea, unspecified: Secondary | ICD-10-CM | POA: Diagnosis present

## 2019-04-01 NOTE — Progress Notes (Signed)
Evidence of IDA on labs. Hemoglobin has dropped slightly. Now 11.1. She had reported rectal bleeding a few times prior to her last office visit. Has she had any further rectal bleeding? Does she have heavy periods? How is her epigastric pain?   She is scheduled for procedures in December, but I am going to have her placed on the cancellation list to see if this can be moved up at all.   [facility] Clinical Pool: Can you place patient on cancellation list? She was TCS +/- EGD; however, labs with new onset IDA. She is definitely going to need EGD and TCS.

## 2019-04-01 NOTE — Progress Notes (Signed)
[place] overall looks good. No gallstones or evidence of inflammation of gallbladder. She does have fatty liver.  Instructions for fatty liver: Recommend 1-2# weight loss per week until ideal body weight through exercise & diet. Low fat/cholesterol diet.  Avoid sweets, sodas, fruit juices, sweetened beverages like tea, etc. Gradually increase exercise from 15 min daily up to 1 hr per day 5 days/week. Limit alcohol use.

## 2019-04-03 ENCOUNTER — Other Ambulatory Visit: Payer: Self-pay | Admitting: Family

## 2019-04-03 DIAGNOSIS — R1010 Upper abdominal pain, unspecified: Secondary | ICD-10-CM

## 2019-04-03 DIAGNOSIS — K219 Gastro-esophageal reflux disease without esophagitis: Secondary | ICD-10-CM

## 2019-04-04 NOTE — Progress Notes (Signed)
Pt added to cancellation list.

## 2019-04-07 ENCOUNTER — Other Ambulatory Visit: Payer: Self-pay | Admitting: Family

## 2019-04-07 DIAGNOSIS — R21 Rash and other nonspecific skin eruption: Secondary | ICD-10-CM

## 2019-04-12 ENCOUNTER — Telehealth: Payer: Self-pay

## 2019-04-12 NOTE — Telephone Encounter (Signed)
[place] requested for pt to be put on cancellation list for TCS/-/+EGD w/Propofol w/RMR. Called pt, procedure moved up to 05/02/19 at 10:30am. LMOVM to inform endo scheduler.

## 2019-04-13 NOTE — Telephone Encounter (Signed)
Pre-op appt 04/28/19 at 1:45pm, COVID test 2:45pm. Appt letter mailed with new procedure instructions.

## 2019-04-27 NOTE — Patient Instructions (Signed)
Dana Lane  04/27/2019     @PREFPERIOPPHARMACY @   Your procedure is scheduled on  05/02/2019.  Report to [facility] at  0900  A.M.  Call this number if you have problems the morning of surgery:  [phone]   Remember:  Follow the diet and prep instructions given to you by Dr Roseanne Kaufman office.                     Take these medicines the morning of surgery with A SIP OF WATER  Prilosec, effexor.    Do not wear jewelry, make-up or nail polish.  Do not wear lotions, powders, or perfumes. Please wear deodorant and brush your teeth.     Do not shave 48 hours prior to surgery.  Men may shave face and neck.  Do not bring valuables to the hospital.  [facility] is not responsible for any belongings or valuables.  Contacts, dentures or bridgework may not be worn into surgery.  Leave your suitcase in the car.  After surgery it may be brought to your room.  For patients admitted to the hospital, discharge time will be determined by your treatment team.  Patients discharged the day of surgery will not be allowed to drive home.   Name and phone number of your driver:   family Special instructions:  None  Please read over the following fact sheets that you were given. Anesthesia Post-op Instructions and Care and Recovery After Surgery       Upper Endoscopy, Adult, Care After This sheet gives you information about how to care for yourself after your procedure. Your health care provider may also give you more specific instructions. If you have problems or questions, contact your health care provider. What can I expect after the procedure? After the procedure, it is common to have:  A sore throat.  Mild stomach pain or discomfort.  Bloating.  Nausea. Follow these instructions at home:   Follow instructions from your health care provider about what to eat or drink after your procedure.  Return to your normal activities as told by your health care provider. Ask your  health care provider what activities are safe for you.  Take over-the-counter and prescription medicines only as told by your health care provider.  Do not drive for 24 hours if you were given a sedative during your procedure.  Keep all follow-up visits as told by your health care provider. This is important. Contact a health care provider if you have:  A sore throat that lasts longer than one day.  Trouble swallowing. Get help right away if:  You vomit blood or your vomit looks like coffee grounds.  You have: ? A fever. ? Bloody, black, or tarry stools. ? A severe sore throat or you cannot swallow. ? Difficulty breathing. ? Severe pain in your chest or abdomen. Summary  After the procedure, it is common to have a sore throat, mild stomach discomfort, bloating, and nausea.  Do not drive for 24 hours if you were given a sedative during the procedure.  Follow instructions from your health care provider about what to eat or drink after your procedure.  Return to your normal activities as told by your health care provider. This information is not intended to replace advice given to you by your health care provider. Make sure you discuss any questions you have with your health care provider. Document Released: 12/09/2011 Document Revised: 12/01/2017 Document  Reviewed: 11/09/2017 Elsevier Patient Education  [place].  Colonoscopy, Adult, Care After This sheet gives you information about how to care for yourself after your procedure. Your health care provider may also give you more specific instructions. If you have problems or questions, contact your health care provider. What can I expect after the procedure? After the procedure, it is common to have:  A small amount of blood in your stool for 24 hours after the procedure.  Some gas.  Mild abdominal cramping or bloating. Follow these instructions at home: General instructions  For the first 24 hours after the  procedure: ? Do not drive or use machinery. ? Do not sign important documents. ? Do not drink alcohol. ? Do your regular daily activities at a slower pace than normal. ? Eat soft, easy-to-digest foods.  Take over-the-counter or prescription medicines only as told by your health care provider. Relieving cramping and bloating   Try walking around when you have cramps or feel bloated.  Apply heat to your abdomen as told by your health care provider. Use a heat source that your health care provider recommends, such as a moist heat pack or a heating pad. ? Place a towel between your skin and the heat source. ? Leave the heat on for 20-30 minutes. ? Remove the heat if your skin turns bright red. This is especially important if you are unable to feel pain, heat, or cold. You may have a greater risk of getting burned. Eating and drinking   Drink enough fluid to keep your urine pale yellow.  Resume your normal diet as instructed by your health care provider. Avoid heavy or fried foods that are hard to digest.  Avoid drinking alcohol for as long as instructed by your health care provider. Contact a health care provider if:  You have blood in your stool 2-3 days after the procedure. Get help right away if:  You have more than a small spotting of blood in your stool.  You pass large blood clots in your stool.  Your abdomen is swollen.  You have nausea or vomiting.  You have a fever.  You have increasing abdominal pain that is not relieved with medicine. Summary  After the procedure, it is common to have a small amount of blood in your stool. You may also have mild abdominal cramping and bloating.  For the first 24 hours after the procedure, do not drive or use machinery, sign important documents, or drink alcohol.  Contact your health care provider if you have a lot of blood in your stool, nausea or vomiting, a fever, or increased abdominal pain. This information is not intended  to replace advice given to you by your health care provider. Make sure you discuss any questions you have with your health care provider. Document Released: 01/22/2004 Document Revised: 04/01/2017 Document Reviewed: 08/21/2015 Elsevier Patient Education  2020 [place] After These instructions provide you with information about caring for yourself after your procedure. Your health care provider may also give you more specific instructions. Your treatment has been planned according to current medical practices, but problems sometimes occur. Call your health care provider if you have any problems or questions after your procedure. What can I expect after the procedure? After your procedure, you may:  Feel sleepy for several hours.  Feel clumsy and have poor balance for several hours.  Feel forgetful about what happened after the procedure.  Have poor judgment for several hours.  Feel nauseous or vomit.  Have a sore throat if you had a breathing tube during the procedure. Follow these instructions at home: For at least 24 hours after the procedure:      Have a responsible adult stay with you. It is important to have someone help care for you until you are awake and alert.  Rest as needed.  Do not: ? Participate in activities in which you could fall or become injured. ? Drive. ? Use heavy machinery. ? Drink alcohol. ? Take sleeping pills or medicines that cause drowsiness. ? Make important decisions or sign legal documents. ? Take care of children on your own. Eating and drinking  Follow the diet that is recommended by your health care provider.  If you vomit, drink water, juice, or soup when you can drink without vomiting.  Make sure you have little or no nausea before eating solid foods. General instructions  Take over-the-counter and prescription medicines only as told by your health care provider.  If you have sleep apnea, surgery  and certain medicines can increase your risk for breathing problems. Follow instructions from your health care provider about wearing your sleep device: ? Anytime you are sleeping, including during daytime naps. ? While taking prescription pain medicines, sleeping medicines, or medicines that make you drowsy.  If you smoke, do not smoke without supervision.  Keep all follow-up visits as told by your health care provider. This is important. Contact a health care provider if:  You keep feeling nauseous or you keep vomiting.  You feel light-headed.  You develop a rash.  You have a fever. Get help right away if:  You have trouble breathing. Summary  For several hours after your procedure, you may feel sleepy and have poor judgment.  Have a responsible adult stay with you for at least 24 hours or until you are awake and alert. This information is not intended to replace advice given to you by your health care provider. Make sure you discuss any questions you have with your health care provider. Document Released: 09/30/2015 Document Revised: 09/07/2017 Document Reviewed: 09/30/2015 Elsevier Patient Education  2020 [employer].

## 2019-04-28 ENCOUNTER — Encounter (HOSPITAL_COMMUNITY)
Admission: RE | Admit: 2019-04-28 | Discharge: 2019-04-28 | Disposition: A | Discharge status: Home / Self Care | Payer: Commercial Managed Care - PPO | Source: Ambulatory Visit | Attending: Internal Medicine | Admitting: Internal Medicine

## 2019-04-28 ENCOUNTER — Encounter (HOSPITAL_COMMUNITY): Payer: Self-pay

## 2019-04-28 ENCOUNTER — Other Ambulatory Visit: Payer: Self-pay

## 2019-04-28 ENCOUNTER — Other Ambulatory Visit (HOSPITAL_COMMUNITY)
Admission: RE | Admit: 2019-04-28 | Discharge: 2019-04-28 | Disposition: A | Discharge status: Home / Self Care | Payer: Commercial Managed Care - PPO | Source: Ambulatory Visit | Attending: Internal Medicine | Admitting: Internal Medicine

## 2019-04-28 DIAGNOSIS — Z01812 Encounter for preprocedural laboratory examination: Secondary | ICD-10-CM | POA: Insufficient documentation

## 2019-04-28 DIAGNOSIS — Z20828 Contact with and (suspected) exposure to other viral communicable diseases: Secondary | ICD-10-CM | POA: Insufficient documentation

## 2019-04-28 HISTORY — DX: Fatty (change of) liver, not elsewhere classified: K76.0

## 2019-04-28 LAB — SARS CORONAVIRUS 2 (TAT 6-24 HRS): SARS Coronavirus 2: NEGATIVE

## 2019-04-28 LAB — HCG, QUANTITATIVE, PREGNANCY: hCG, Beta Chain, Quant, S: <1 m[IU]/mL (ref <5)

## 2019-05-02 ENCOUNTER — Other Ambulatory Visit: Payer: Self-pay

## 2019-05-02 ENCOUNTER — Ambulatory Visit (HOSPITAL_COMMUNITY): Payer: Commercial Managed Care - PPO | Admitting: Anesthesiology

## 2019-05-02 ENCOUNTER — Encounter (HOSPITAL_COMMUNITY)
Admission: RE | Disposition: A | Discharge status: Home / Self Care | Payer: Self-pay | Source: Home / Self Care | Attending: Internal Medicine

## 2019-05-02 ENCOUNTER — Encounter (HOSPITAL_COMMUNITY): Payer: Self-pay

## 2019-05-02 ENCOUNTER — Ambulatory Visit (HOSPITAL_COMMUNITY)
Admission: RE | Admit: 2019-05-02 | Discharge: 2019-05-02 | Disposition: A | Discharge status: Home / Self Care | Payer: Commercial Managed Care - PPO | Attending: Internal Medicine | Admitting: Internal Medicine

## 2019-05-02 DIAGNOSIS — K317 Polyp of stomach and duodenum: Secondary | ICD-10-CM | POA: Insufficient documentation

## 2019-05-02 DIAGNOSIS — K760 Fatty (change of) liver, not elsewhere classified: Secondary | ICD-10-CM | POA: Insufficient documentation

## 2019-05-02 DIAGNOSIS — Z91048 Other nonmedicinal substance allergy status: Secondary | ICD-10-CM | POA: Diagnosis not present

## 2019-05-02 DIAGNOSIS — K219 Gastro-esophageal reflux disease without esophagitis: Secondary | ICD-10-CM | POA: Insufficient documentation

## 2019-05-02 DIAGNOSIS — K921 Melena: Secondary | ICD-10-CM | POA: Diagnosis not present

## 2019-05-02 DIAGNOSIS — Z79899 Other long term (current) drug therapy: Secondary | ICD-10-CM | POA: Diagnosis not present

## 2019-05-02 DIAGNOSIS — Z91012 Allergy to eggs: Secondary | ICD-10-CM | POA: Insufficient documentation

## 2019-05-02 DIAGNOSIS — F329 Major depressive disorder, single episode, unspecified: Secondary | ICD-10-CM | POA: Diagnosis not present

## 2019-05-02 DIAGNOSIS — Z91018 Allergy to other foods: Secondary | ICD-10-CM | POA: Diagnosis not present

## 2019-05-02 DIAGNOSIS — K2950 Unspecified chronic gastritis without bleeding: Secondary | ICD-10-CM | POA: Insufficient documentation

## 2019-05-02 DIAGNOSIS — R1011 Right upper quadrant pain: Secondary | ICD-10-CM | POA: Diagnosis present

## 2019-05-02 DIAGNOSIS — R1010 Upper abdominal pain, unspecified: Secondary | ICD-10-CM

## 2019-05-02 DIAGNOSIS — K625 Hemorrhage of anus and rectum: Secondary | ICD-10-CM

## 2019-05-02 HISTORY — PX: BIOPSY: SHX5522

## 2019-05-02 HISTORY — PX: ESOPHAGOGASTRODUODENOSCOPY (EGD) WITH PROPOFOL: SHX5813

## 2019-05-02 HISTORY — PX: COLONOSCOPY WITH PROPOFOL: SHX5780

## 2019-05-02 LAB — CBC WITH DIFFERENTIAL/PLATELET
Abs Immature Granulocytes: 0.02 10*3/uL (ref 0.00–0.07)
Basophils Absolute: 0.1 10*3/uL (ref 0.0–0.1)
Basophils Relative: 1 %
Eosinophils Absolute: 0.1 10*3/uL (ref 0.0–0.5)
Eosinophils Relative: 2 %
HCT: 42.1 % (ref 36.0–46.0)
Hemoglobin: 12.9 g/dL (ref 12.0–15.0)
Immature Granulocytes: 0 %
Lymphocytes Relative: 40 %
Lymphs Abs: 2.6 10*3/uL (ref 0.7–4.0)
MCH: 27.1 pg (ref 26.0–34.0)
MCHC: 30.6 g/dL (ref 30.0–36.0)
MCV: 88.4 fL (ref 80.0–100.0)
Monocytes Absolute: 0.3 10*3/uL (ref 0.1–1.0)
Monocytes Relative: 4 %
Neutro Abs: 3.5 10*3/uL (ref 1.7–7.7)
Neutrophils Relative %: 53 %
Platelets: 337 10*3/uL (ref 150–400)
RBC: 4.76 MIL/uL (ref 3.87–5.11)
RDW: 18.6 % — ABNORMAL HIGH (ref 11.5–15.5)
WBC: 6.6 10*3/uL (ref 4.0–10.5)
nRBC: 0 % (ref 0.0–0.2)

## 2019-05-02 SURGERY — COLONOSCOPY WITH PROPOFOL
Anesthesia: General

## 2019-05-02 MED ORDER — HYDROMORPHONE HCL 1 MG/ML IJ SOLN: 0.2500 mg | INTRAMUSCULAR | Status: DC | PRN

## 2019-05-02 MED ORDER — PROPOFOL 10 MG/ML IV BOLUS
INTRAVENOUS | Status: AC
  Filled 2019-05-02: qty 60

## 2019-05-02 MED ORDER — HYDROCODONE-ACETAMINOPHEN 7.5-325 MG PO TABS: 1.0000 | ORAL_TABLET | Freq: Once | ORAL | Status: DC | PRN

## 2019-05-02 MED ORDER — ONDANSETRON HCL 4 MG/2ML IJ SOLN
INTRAMUSCULAR | Status: AC
  Filled 2019-05-02: qty 2

## 2019-05-02 MED ORDER — MIDAZOLAM HCL 2 MG/2ML IJ SOLN: 0.5000 mg | Freq: Once | INTRAMUSCULAR | Status: DC | PRN

## 2019-05-02 MED ORDER — CHLORHEXIDINE GLUCONATE CLOTH 2 % EX PADS: 6.0000 | MEDICATED_PAD | Freq: Once | CUTANEOUS | Status: DC

## 2019-05-02 MED ORDER — PROPOFOL 500 MG/50ML IV EMUL
INTRAVENOUS | Status: DC | PRN
  Administered 2019-05-02: 200 ug/kg/min via INTRAVENOUS

## 2019-05-02 MED ORDER — LIDOCAINE HCL (CARDIAC) PF 50 MG/5ML IV SOSY
PREFILLED_SYRINGE | INTRAVENOUS | Status: DC | PRN
  Administered 2019-05-02: 30 mg via INTRAVENOUS

## 2019-05-02 MED ORDER — ONDANSETRON HCL 4 MG/2ML IJ SOLN
INTRAMUSCULAR | Status: DC | PRN
  Administered 2019-05-02: 4 mg via INTRAVENOUS

## 2019-05-02 MED ORDER — LACTATED RINGERS IV SOLN
INTRAVENOUS | Status: DC
  Administered 2019-05-02: 11:00:00 via INTRAVENOUS

## 2019-05-02 MED ORDER — PROPOFOL 10 MG/ML IV BOLUS
INTRAVENOUS | Status: DC | PRN
  Administered 2019-05-02: 40 mg via INTRAVENOUS
  Administered 2019-05-02: 20 mg via INTRAVENOUS
  Administered 2019-05-02: 40 mg via INTRAVENOUS

## 2019-05-02 MED ORDER — PROMETHAZINE HCL 25 MG/ML IJ SOLN: 6.2500 mg | INTRAMUSCULAR | Status: DC | PRN

## 2019-05-02 MED ORDER — LIDOCAINE 2% (20 MG/ML) 5 ML SYRINGE
INTRAMUSCULAR | Status: AC
  Filled 2019-05-02: qty 10

## 2019-05-02 NOTE — Op Note (Addendum)
[facility] Patient Name: Dana Lane Procedure Date: 05/02/2019 10:51 AM MRN: [id number] Date of Birth: June 26, 1995 Attending MD: Gennette Pacobert Michael Rourk , MD CSN: [id number] Age: 23 Admit Type: Outpatient Procedure:                Upper GI endoscopy Indications:              Abdominal pain in the right upper quadrant,                            Abdominal pain in the left upper quadrant Providers:                Gennette Pacobert Michael Rourk, MD, Jannett CelestineAnitra Bell, RN, Dyann Ruddleonya                            Wilson Referring MD:              Medicines:                Propofol per Anesthesia Complications:            No immediate complications. Estimated Blood Loss:     Estimated blood loss was minimal. Procedure:                Pre-Anesthesia Assessment:                           - Prior to the procedure, a History and Physical                            was performed, and patient medications and                            allergies were reviewed. The patient's tolerance of                            previous anesthesia was also reviewed. The risks                            and benefits of the procedure and the sedation                            options and risks were discussed with the patient.                            All questions were answered, and informed consent                            was obtained. Prior Anticoagulants: The patient has                            taken no previous anticoagulant or antiplatelet                            agents. ASA Grade Assessment: II - A patient with  mild systemic disease. After reviewing the risks                            and benefits, the patient was deemed in                            satisfactory condition to undergo the procedure.                           After obtaining informed consent, the endoscope was                            passed under direct vision. Throughout the                            procedure, the  patient's blood pressure, pulse, and                            oxygen saturations were monitored continuously. The                            [id number]-[id number] ([id number]) scope was introduced through the                            mouth, and advanced to the second part of duodenum.                            The upper GI endoscopy was accomplished without                            difficulty. The patient tolerated the procedure                            well. Scope In: 11:14:21 AM Scope Out: 11:20:01 AM Total Procedure Duration: 0 hours 5 minutes 40 seconds  Findings:      The examined esophagus was normal.      Multiple 2 mm pedunculated and sessile polyps with no stigmata of recent       bleeding were found in the entire examined stomach. The polyp was       removed with a cold biopsy forceps. Resection and retrieval were       complete. Estimated blood loss was minimal. This was biopsied with a       cold forceps for histology. Estimated blood loss was minimal. Patchy       erythema in the gastric mucosa diffusely. No ulcer or infiltrating       process. Biopsies taken      The duodenal bulb and second portion of the duodenum were normal. Impression:               - Normal esophagus.                           - Multiple gastric polyps. Resected and retrieved.  Biopsied. Abnormal gastric mucosa of doubtful                            clinical significance status post biopsy                           - Normal duodenal bulb and second portion of the                            duodenum. Moderate Sedation:      Moderate (conscious) sedation was personally administered by an       anesthesia professional. The following parameters were monitored: oxygen       saturation, heart rate, blood pressure, respiratory rate, EKG, adequacy       of pulmonary ventilation, and response to care. Recommendation:           - Patient has a contact number available for                             emergencies. The signs and symptoms of potential                            delayed complications were discussed with the                            patient. Return to normal activities tomorrow.                            Written discharge instructions were provided to the                            patient.                           - Resume previous diet.                           - Continue present medications.                           - Await pathology results.                           See colonoscopy report Procedure Code(s):        --- Professional ---                           [phone], Esophagogastroduodenoscopy, flexible,                            transoral; with biopsy, single or multiple Diagnosis Code(s):        --- Professional ---                           K31.7, Polyp of stomach and duodenum  R10.11, Right upper quadrant pain                           R10.12, Left upper quadrant pain CPT copyright 2019 American Medical Association. All rights reserved. The codes documented in this report are preliminary and upon coder review may  be revised to meet current compliance requirements. Gerrit Friends. Rourk, MD Gennette Pac, MD 05/02/2019 11:43:06 AM This report has been signed electronically. Number of Addenda: 0

## 2019-05-02 NOTE — H&P (Signed)
@LOGO @   Primary Care Physician:  Sharion Balloon, FNP Primary Gastroenterologist:  Dr. Gala Romney  Pre-Procedure History & Physical: HPI:  Dana Lane is a 23 y.o. female here for further evaluation of upper abdominal pain.  Not diminished with omeprazole 40 mg daily.  No dysphagia.  Ultrasound right upper quadrant okay (fatty liver).  Rectal bleeding has subsided.  Past Medical History:  Diagnosis Date  . Ankle sprain    right  . Depression   . Fatty liver disease, nonalcoholic   . GERD (gastroesophageal reflux disease)   . Tonsillitis     Past Surgical History:  Procedure Laterality Date  . EYE SURGERY    . TONSILLECTOMY AND ADENOIDECTOMY Bilateral 11/16/2018   Procedure: TONSILLECTOMY AND ADENOIDECTOMY;  Surgeon: Leta Baptist, MD;  Location: [place];  Service: ENT;  Laterality: Bilateral;    Prior to Admission medications   Medication Sig Start Date End Date Taking? Authorizing Provider  Ferrous Sulfate (IRON PO) Take 18 mg by mouth daily. Gummiest (2 gummies)   Yes [provider]  fluticasone (FLONASE) 50 MCG/ACT nasal spray Place into both nostrils as needed for allergies or rhinitis.   Yes [provider]  Multiple Vitamin (MULTIVITAMIN WITH MINERALS) TABS tablet Take 1 tablet by mouth daily. One-A-Day Womens   Yes [provider]  omeprazole (PRILOSEC) 40 MG capsule TAKE 1 CAPSULE BY MOUTH  DAILY Patient taking differently: Take 40 mg by mouth 2 (two) times daily.  04/04/19  Yes Hawks, Christy A, FNP  polyethylene glycol-electrolytes (NULYTELY/GOLYTELY) 420 g solution Take 4,000 mLs by mouth once. 03/25/19  Yes [provider]  venlafaxine XR (EFFEXOR-XR) 75 MG 24 hr capsule TAKE 1 CAPSULE BY MOUTH  DAILY WITH BREAKFAST Patient taking differently: Take 75 mg by mouth daily with breakfast.  03/21/19  Yes Sharion Balloon, FNP    Allergies as of 03/17/2019 - Review Complete 03/17/2019  Allergen Reaction Noted  . Poison oak  extract [poison oak extract] Itching and Rash 01/09/2016  . Chocolate Itching and Swelling 12/07/2014  . Eggs or egg-derived products Diarrhea and Other (See Comments) 12/07/2014    Family History  Problem Relation Age of Onset  . Diabetes Mother   . Colon cancer Mother        ? patient isn't sure. 75 mom had this when she was younger (<50 y.o.)  . Cancer Father        prostate  . Depression Sister   . Cancer Sister 25       gallbladder  . Colon polyps Neg Hx     Social History   Socioeconomic History  . Marital status: Single    Spouse name: Not on file  . Number of children: Not on file  . Years of education: 51  . Highest education level: Not on file  Occupational History  . Not on file  Social Needs  . Financial resource strain: Not on file  . Food insecurity    Worry: Not on file    Inability: Not on file  . Transportation needs    Medical: Not on file    Non-medical: Not on file  Tobacco Use  . Smoking status: Never Smoker  . Smokeless tobacco: Never Used  Substance and Sexual Activity  . Alcohol use: Not Currently  . Drug use: No  . Sexual activity: Yes    Birth control/protection: Condom  Lifestyle  . Physical activity    Days per week: Not on file  Minutes per session: Not on file  . Stress: Not on file  Relationships  . Social Musician on phone: Not on file    Gets together: Not on file    Attends religious service: Not on file    Active member of club or organization: Not on file    Attends meetings of clubs or organizations: Not on file    Relationship status: Not on file  . Intimate partner violence    Fear of current or ex partner: Not on file    Emotionally abused: Not on file    Physically abused: Not on file    Forced sexual activity: Not on file  Other Topics Concern  . Not on file  Social History Narrative  . Not on file    Review of Systems: See HPI, otherwise negative ROS  Physical Exam: BP 136/83   Pulse  79   Temp 97.7 F (36.5 C) (Oral)   Resp (!) 22   SpO2 100%  General:   Alert,  Well-developed, well-nourished, pleasant and cooperative in NAD Neck:  Supple; no masses or thyromegaly. No significant cervical adenopathy. Lungs:  Clear throughout to auscultation.   No wheezes, crackles, or rhonchi. No acute distress. Heart:  Regular rate and rhythm; no murmurs, clicks, rubs,  or gallops. Abdomen: Non-distended, normal bowel sounds.  Soft and nontender without appreciable mass or hepatosplenomegaly.  Pulses:  Normal pulses noted. Extremities:  Without clubbing or edema.  Impression/Plan: 23 year old lady with nonspecific upper abdominal pain history of intermittent rectal bleeding.  Here for EGD and colonoscopy to further evaluate.  The risks, benefits, limitations, imponderables and alternatives regarding both EGD and colonoscopy have been reviewed with the patient. Questions have been answered. All parties agreeable.      Notice: This dictation was prepared with Dragon dictation along with smaller phrase technology. Any transcriptional errors that result from this process are unintentional and may not be corrected upon review.

## 2019-05-02 NOTE — Discharge Instructions (Signed)
EGD Discharge instructions Please read the instructions outlined below and refer to this sheet in the next few weeks. These discharge instructions provide you with general information on caring for yourself after you leave the hospital. Your doctor may also give you specific instructions. While your treatment has been planned according to the most current medical practices available, unavoidable complications occasionally occur. If you have any problems or questions after discharge, please call your doctor. ACTIVITY  You may resume your regular activity but move at a slower pace for the next 24 hours.   Take frequent rest periods for the next 24 hours.   Walking will help expel (get rid of) the air and reduce the bloated feeling in your abdomen.   No driving for 24 hours (because of the anesthesia (medicine) used during the test).   You may shower.   Do not sign any important legal documents or operate any machinery for 24 hours (because of the anesthesia used during the test).  NUTRITION  Drink plenty of fluids.   You may resume your normal diet.   Begin with a light meal and progress to your normal diet.   Avoid alcoholic beverages for 24 hours or as instructed by your caregiver.  MEDICATIONS  You may resume your normal medications unless your caregiver tells you otherwise.  WHAT YOU CAN EXPECT TODAY  You may experience abdominal discomfort such as a feeling of fullness or gas pains.  FOLLOW-UP  Your doctor will discuss the results of your test with you.  SEEK IMMEDIATE MEDICAL ATTENTION IF ANY OF THE FOLLOWING OCCUR:  Excessive nausea (feeling sick to your stomach) and/or vomiting.   Severe abdominal pain and distention (swelling).   Trouble swallowing.   Temperature over 101 F (37.8 C).   Rectal bleeding or vomiting of blood.   Colonoscopy Discharge Instructions  Read the instructions outlined below and refer to this sheet in the next few weeks. These  discharge instructions provide you with general information on caring for yourself after you leave the hospital. Your doctor may also give you specific instructions. While your treatment has been planned according to the most current medical practices available, unavoidable complications occasionally occur. If you have any problems or questions after discharge, call Dr. Gala Romney at [phone]. ACTIVITY  You may resume your regular activity, but move at a slower pace for the next 24 hours.   Take frequent rest periods for the next 24 hours.   Walking will help get rid of the air and reduce the bloated feeling in your belly (abdomen).   No driving for 24 hours (because of the medicine (anesthesia) used during the test).    Do not sign any important legal documents or operate any machinery for 24 hours (because of the anesthesia used during the test).  NUTRITION  Drink plenty of fluids.   You may resume your normal diet as instructed by your doctor.   Begin with a light meal and progress to your normal diet. Heavy or fried foods are harder to digest and may make you feel sick to your stomach (nauseated).   Avoid alcoholic beverages for 24 hours or as instructed.  MEDICATIONS  You may resume your normal medications unless your doctor tells you otherwise.  WHAT YOU CAN EXPECT TODAY  Some feelings of bloating in the abdomen.   Passage of more gas than usual.   Spotting of blood in your stool or on the toilet paper.  IF YOU HAD POLYPS REMOVED DURING THE COLONOSCOPY:  No aspirin products for 7 days or as instructed.   No alcohol for 7 days or as instructed.   Eat a soft diet for the next 24 hours.  FINDING OUT THE RESULTS OF YOUR TEST Not all test results are available during your visit. If your test results are not back during the visit, make an appointment with your caregiver to find out the results. Do not assume everything is normal if you have not heard from your caregiver or the  medical facility. It is important for you to follow up on all of your test results.  SEEK IMMEDIATE MEDICAL ATTENTION IF:  You have more than a spotting of blood in your stool.   Your belly is swollen (abdominal distention).   You are nauseated or vomiting.   You have a temperature over 101.   You have abdominal pain or discomfort that is severe or gets worse throughout the day.   Further recommendations to follow pending review of pathology report  Patient request, I called mother-in-law, Kenney Houseman [phone] and discussed results  Office visit with [place] in 2 months

## 2019-05-02 NOTE — Op Note (Signed)
[facility] Patient Name: Dana Lane Procedure Date: 05/02/2019 10:50 AM MRN: [id number] Date of Birth: [birth year] Attending MD: Norvel Richards , MD CSN: [id number] Age: 23 Admit Type: Outpatient Procedure:                Colonoscopy Indications:              Hematochezia Providers:                Norvel Richards, MD, Janeece Riggers, RN, Aram Candela Referring MD:              Medicines:                Propofol per Anesthesia Complications:            No immediate complications. Estimated Blood Loss:     Estimated blood loss: none. Procedure:                Pre-Anesthesia Assessment:                           - Prior to the procedure, a History and Physical                            was performed, and patient medications and                            allergies were reviewed. The patient's tolerance of                            previous anesthesia was also reviewed. The risks                            and benefits of the procedure and the sedation                            options and risks were discussed with the patient.                            All questions were answered, and informed consent                            was obtained. Prior Anticoagulants: The patient has                            taken no previous anticoagulant or antiplatelet                            agents. ASA Grade Assessment: II - A patient with                            mild systemic disease. After reviewing the risks                            and benefits,  the patient was deemed in                            satisfactory condition to undergo the procedure.                           After obtaining informed consent, the colonoscope                            was passed under direct vision. Throughout the                            procedure, the patient's blood pressure, pulse, and                            oxygen saturations were monitored continuously.  The                            [id number] ([id number]) scope was introduced through                            the anus and advanced to the 5 cm into the ileum.                            The colonoscopy was performed without difficulty.                            The patient tolerated the procedure well. The                            quality of the bowel preparation was adequate. The                            terminal ileum, ileocecal valve, appendiceal                            orifice, and rectum were photographed. The entire                            colon was well visualized. Scope In: 11:25:04 AM Scope Out: 11:36:01 AM Scope Withdrawal Time: 0 hours 6 minutes 46 seconds  Total Procedure Duration: 0 hours 10 minutes 57 seconds  Findings:      The perianal and digital rectal examinations were normal.      The colon (entire examined portion) appeared normal.      The retroflexed view of the distal rectum and anal verge was normal and       showed no anal or rectal abnormalities. Impression:               - The entire examined colon is normal.                           - The distal rectum and anal verge are normal on  retroflexion view.                           - No specimens collected. Normal terminal ileum. I                            suspect recent trivial rectal bleeding Moderate Sedation:      Moderate (conscious) sedation was personally administered by an       anesthesia professional. The following parameters were monitored: oxygen       saturation, heart rate, blood pressure, respiratory rate, EKG, adequacy       of pulmonary ventilation, and response to care. Recommendation:           - Patient has a contact number available for                            emergencies. The signs and symptoms of potential                            delayed complications were discussed with the                            patient. Return to normal activities tomorrow.                             Written discharge instructions were provided to the                            patient.                           - Advance diet as tolerated.                           - Continue present medications.                           - Repeat colonoscopy at age 23 for screening                            purposes.                           - Return to GI office in 2 months. See EGD report Procedure Code(s):        --- Professional ---                           [phone], Colonoscopy, flexible; diagnostic, including                            collection of specimen(s) by brushing or washing,                            when performed (separate procedure) Diagnosis Code(s):        --- Professional ---  K92.1, Melena (includes Hematochezia) CPT copyright 2019 American Medical Association. All rights reserved. The codes documented in this report are preliminary and upon coder review may  be revised to meet current compliance requirements. Gerrit Friends. Rourk, MD Gennette Pac, MD 05/02/2019 11:45:59 AM This report has been signed electronically. Number of Addenda: 0

## 2019-05-02 NOTE — Transfer of Care (Signed)
Immediate Anesthesia Transfer of Care Note  Patient: Dana Lane  Procedure(s) Performed: COLONOSCOPY WITH PROPOFOL (N/A ) ESOPHAGOGASTRODUODENOSCOPY (EGD) WITH PROPOFOL (N/A ) BIOPSY  Patient Location: PACU  Anesthesia Type:General  Level of Consciousness: awake and patient cooperative  Airway & Oxygen Therapy: Patient Spontanous Breathing  Post-op Assessment: Report given to RN and Post -op Vital signs reviewed and stable  Post vital signs: Reviewed and stable  Last Vitals:  Vitals Value Taken Time  BP    Temp    Pulse    Resp    SpO2      Last Pain:  Vitals:   05/02/19 1019  TempSrc: Oral  PainSc: 0-No pain         Complications: No apparent anesthesia complications  SEE PACU FLOW SHEETS FOR VITAL SIGNS

## 2019-05-02 NOTE — Anesthesia Postprocedure Evaluation (Signed)
Anesthesia Post Note  Patient: Dana Lane  Procedure(s) Performed: COLONOSCOPY WITH PROPOFOL (N/A ) ESOPHAGOGASTRODUODENOSCOPY (EGD) WITH PROPOFOL (N/A ) BIOPSY  Patient location during evaluation: PACU Anesthesia Type: General Level of consciousness: awake, awake and alert and patient cooperative Pain management: satisfactory to patient Vital Signs Assessment: post-procedure vital signs reviewed and stable Respiratory status: spontaneous breathing Cardiovascular status: tachycardic Postop Assessment: no apparent nausea or vomiting Anesthetic complications: no     Last Vitals:  Vitals:   05/02/19 1200 05/02/19 1215  BP: 104/69 103/68  Pulse:    Resp: 16 18  Temp:    SpO2: 100% 100%    Last Pain:  Vitals:   05/02/19 1150  TempSrc:   PainSc: Asleep                 YATES,TERESA

## 2019-05-02 NOTE — Anesthesia Preprocedure Evaluation (Signed)
Anesthesia Evaluation  Patient identified by MRN, date of birth, ID band Patient awake    Reviewed: Allergy & Precautions, NPO status , Patient's Chart, lab work & pertinent test results  Airway Mallampati: II  TM Distance: >3 FB Neck ROM: Full    Dental no notable dental hx. (+) Teeth Intact   Pulmonary neg pulmonary ROS,    Pulmonary exam normal breath sounds clear to auscultation       Cardiovascular Exercise Tolerance: Good negative cardio ROS Normal cardiovascular examI Rhythm:Regular Rate:Normal     Neuro/Psych Depression negative neurological ROS  negative psych ROS   GI/Hepatic Neg liver ROS, GERD  Medicated and Controlled,  Endo/Other  negative endocrine ROS  Renal/GU negative Renal ROS  negative genitourinary   Musculoskeletal negative musculoskeletal ROS (+)   Abdominal   Peds negative pediatric ROS (+)  Hematology negative hematology ROS (+)   Anesthesia Other Findings   Reproductive/Obstetrics negative OB ROS                             Anesthesia Physical Anesthesia Plan  ASA: II  Anesthesia Plan: General   Post-op Pain Management:    Induction: Intravenous  PONV Risk Score and Plan: 3 and TIVA, Propofol infusion, Ondansetron and Treatment may vary due to age or medical condition  Airway Management Planned: Nasal Cannula and Simple Face Mask  Additional Equipment:   Intra-op Plan:   Post-operative Plan:   Informed Consent: I have reviewed the patients History and Physical, chart, labs and discussed the procedure including the risks, benefits and alternatives for the proposed anesthesia with the patient or authorized representative who has indicated his/her understanding and acceptance.     Dental advisory given  Plan Discussed with: CRNA  Anesthesia Plan Comments: (Plan Full PPE use  Plan GA with GETA as needed d/w pt -WTP with same after Q&A)         Anesthesia Quick Evaluation

## 2019-05-03 ENCOUNTER — Encounter: Payer: Self-pay | Admitting: Internal Medicine

## 2019-05-03 LAB — SURGICAL PATHOLOGY

## 2019-05-05 ENCOUNTER — Ambulatory Visit (INDEPENDENT_AMBULATORY_CARE_PROVIDER_SITE_OTHER): Payer: Commercial Managed Care - PPO | Admitting: Adult Health

## 2019-05-05 ENCOUNTER — Other Ambulatory Visit: Payer: Self-pay

## 2019-05-05 ENCOUNTER — Encounter (HOSPITAL_COMMUNITY): Payer: Self-pay | Admitting: Internal Medicine

## 2019-05-05 VITALS — BP 114/81 | HR 96 | Ht 64.0 in | Wt 226.0 lb

## 2019-05-05 DIAGNOSIS — R635 Abnormal weight gain: Secondary | ICD-10-CM | POA: Diagnosis not present

## 2019-05-05 DIAGNOSIS — Z3202 Encounter for pregnancy test, result negative: Secondary | ICD-10-CM | POA: Diagnosis not present

## 2019-05-05 DIAGNOSIS — N926 Irregular menstruation, unspecified: Secondary | ICD-10-CM | POA: Diagnosis not present

## 2019-05-05 DIAGNOSIS — Z319 Encounter for procreative management, unspecified: Secondary | ICD-10-CM

## 2019-05-05 HISTORY — DX: Encounter for procreative management, unspecified: Z31.9

## 2019-05-05 LAB — POCT URINE PREGNANCY: Preg Test, Ur: NEGATIVE

## 2019-05-05 MED ORDER — MEDROXYPROGESTERONE ACETATE 10 MG PO TABS: 10.0000 mg | ORAL_TABLET | Freq: Every day | ORAL | 0 refills | Status: DC

## 2019-05-05 NOTE — Progress Notes (Signed)
  Subjective:     Patient ID: Dana Lane, female   DOB: 1996/02/06, 23 y.o.   MRN: [id number]  HPI Dana Lane is a 23 year old white female, single, G0P0, in for no period since July PCP is Evelina Dun, FNP   Review of Systems No period since July 21, has some blood when wiped 10/31 till yesterday Denies any pain Periods were regular till July, and has been home since July  Has had normal labs and pelvic US  She has also had a endoscopy and colonoscopy, had stomach polyp Has gained 10 lbs recently   Reviewed past medical,surgical, social and family history. Reviewed medications and allergies.     Objective:   Physical Exam BP 114/81 (BP Location: Left Arm, Patient Position: Sitting, Cuff Size: Normal)   Pulse 96   Ht 5\' 4"  (1.626 m)   Wt 226 lb (102.5 kg)   LMP 01/11/2019 (Exact Date)   BMI 38.79 kg/m   UPT is negative Skin warm and dry. Neck: mid line trachea, normal thyroid, good ROM, no lymphadenopathy noted. Lungs: clear to ausculation bilaterally. Cardiovascular: regular rate and rhythm. Fall risk is low PHQ 2 score is 0. Exam by Weyman Croon FNP student.    Assessment:     1. Missed periods   2. Weight gain   3. Patient desires pregnancy       Plan:     Will rx provera 10 mg 1 daily for 10 days to get a withdrawal bleed Meds ordered this encounter  Medications  . medroxyPROGESTERone (PROVERA) 10 MG tablet    Sig: Take 1 tablet (10 mg total) by mouth daily.    Dispense:  10 tablet    Refill:  0    Order Specific Question:   Supervising Provider    Answer:   Florian Buff [[id number]]  If has bleeding will start OCs for about 3 months, then stop to see if has period and check progesterone level, if not will check A1c Discussed timing of sex Follow up 05/25/2019

## 2019-05-23 ENCOUNTER — Other Ambulatory Visit: Payer: Self-pay

## 2019-05-23 ENCOUNTER — Emergency Department (HOSPITAL_COMMUNITY)
Admission: EM | Admit: 2019-05-23 | Discharge: 2019-05-23 | Disposition: A | Discharge status: Home / Self Care | Payer: Commercial Managed Care - PPO | Attending: Emergency Medicine | Admitting: Emergency Medicine

## 2019-05-23 ENCOUNTER — Telehealth: Payer: Self-pay | Admitting: Internal Medicine

## 2019-05-23 ENCOUNTER — Encounter (HOSPITAL_COMMUNITY): Payer: Self-pay | Admitting: Emergency Medicine

## 2019-05-23 ENCOUNTER — Emergency Department (HOSPITAL_COMMUNITY): Payer: Commercial Managed Care - PPO

## 2019-05-23 DIAGNOSIS — Z79899 Other long term (current) drug therapy: Secondary | ICD-10-CM | POA: Diagnosis not present

## 2019-05-23 DIAGNOSIS — N12 Tubulo-interstitial nephritis, not specified as acute or chronic: Secondary | ICD-10-CM | POA: Diagnosis not present

## 2019-05-23 DIAGNOSIS — R109 Unspecified abdominal pain: Secondary | ICD-10-CM | POA: Diagnosis present

## 2019-05-23 LAB — URINALYSIS, ROUTINE W REFLEX MICROSCOPIC
Bilirubin Urine: NEGATIVE
Glucose, UA: NEGATIVE mg/dL
Hgb urine dipstick: NEGATIVE
Ketones, ur: NEGATIVE mg/dL
Leukocytes,Ua: NEGATIVE
Nitrite: POSITIVE — AB
Protein, ur: NEGATIVE mg/dL
Specific Gravity, Urine: 1.024 (ref 1.005–1.030)
pH: 6 (ref 5.0–8.0)

## 2019-05-23 LAB — COMPREHENSIVE METABOLIC PANEL
ALT: 31 U/L (ref 0–44)
AST: 20 U/L (ref 15–41)
Albumin: 4.3 g/dL (ref 3.5–5.0)
Alkaline Phosphatase: 70 U/L (ref 38–126)
Anion gap: 8 (ref 5–15)
BUN: 16 mg/dL (ref 6–20)
CO2: 26 mmol/L (ref 22–32)
Calcium: 9.1 mg/dL (ref 8.9–10.3)
Chloride: 102 mmol/L (ref 98–111)
Creatinine, Ser: 0.85 mg/dL (ref 0.44–1.00)
GFR calc Af Amer: >60 mL/min (ref >60)
GFR calc non Af Amer: >60 mL/min (ref >60)
Glucose, Bld: 98 mg/dL (ref 70–99)
Potassium: 4.4 mmol/L (ref 3.5–5.1)
Sodium: 136 mmol/L (ref 135–145)
Total Bilirubin: 0.3 mg/dL (ref 0.3–1.2)
Total Protein: 7.9 g/dL (ref 6.5–8.1)

## 2019-05-23 LAB — LIPASE, BLOOD: Lipase: 38 U/L (ref 11–51)

## 2019-05-23 LAB — CBC
HCT: 41.7 % (ref 36.0–46.0)
Hemoglobin: 13.1 g/dL (ref 12.0–15.0)
MCH: 27.8 pg (ref 26.0–34.0)
MCHC: 31.4 g/dL (ref 30.0–36.0)
MCV: 88.3 fL (ref 80.0–100.0)
Platelets: 450 10*3/uL — ABNORMAL HIGH (ref 150–400)
RBC: 4.72 MIL/uL (ref 3.87–5.11)
RDW: 17.4 % — ABNORMAL HIGH (ref 11.5–15.5)
WBC: 9.3 10*3/uL (ref 4.0–10.5)
nRBC: 0 % (ref 0.0–0.2)

## 2019-05-23 LAB — PREGNANCY, URINE: Preg Test, Ur: NEGATIVE

## 2019-05-23 MED ORDER — KETOROLAC TROMETHAMINE 30 MG/ML IJ SOLN
30.0000 mg | Freq: Once | INTRAMUSCULAR | Status: AC
  Administered 2019-05-23: 30 mg via INTRAVENOUS
  Filled 2019-05-23: qty 1

## 2019-05-23 MED ORDER — CEPHALEXIN 500 MG PO CAPS: 500.0000 mg | ORAL_CAPSULE | Freq: Three times a day (TID) | ORAL | 0 refills | Status: AC

## 2019-05-23 NOTE — ED Provider Notes (Signed)
[facility] EMERGENCY DEPARTMENT Provider Note   CSN: [id number] Arrival date & time: 05/23/19  1505     History   Chief Complaint Chief Complaint  Patient presents with  . Flank Pain    HPI Dana Lane is a 23 y.o. female with a past medical history significant for depression, fatty liver, and GERD who presents to the ED for an evaluation of right flank and back pain that has progressively gotten worse since her colonoscopy and endoscopy on 11/9. Patient notes her pain has gotten worse and more constant since Saturday.  Patient describes the pain as a stabbing sensation.  She has tried Tylenol and a heating pad with mild relief.  Patient denies urinary and vaginal symptoms.  Patient denies history of kidney stones.  Patient denies abdominal pain and rectal bleeding.   Last menstrual period was in July. She is not on any birth control. Patient is sexually active with one partner with protection. Patient denies abdominal pain, nausea, vomiting, chest pain, and shortness of breath.   Past Medical History:  Diagnosis Date  . Ankle sprain    right  . Depression   . Fatty liver disease, nonalcoholic   . GERD (gastroesophageal reflux disease)   . Tonsillitis     Patient Active Problem List   Diagnosis Date Noted  . Missed periods 05/05/2019  . Weight gain 05/05/2019  . Patient desires pregnancy 05/05/2019  . Rectal bleeding 03/17/2019  . Pain of upper abdomen 03/17/2019  . MDD (major depressive disorder), recurrent episode, moderate (HCC) 03/25/2018  . Obesity (BMI 30-39.9) 12/11/2017  . Chest wall pain 09/14/2015  . Gastroesophageal reflux disease 06/18/2015    Past Surgical History:  Procedure Laterality Date  . BIOPSY  05/02/2019   Procedure: BIOPSY;  Surgeon: Corbin Ade, MD;  Location: [facility];  Service: Endoscopy;;  gastric polyps   . COLONOSCOPY WITH PROPOFOL N/A 05/02/2019   Procedure: COLONOSCOPY WITH PROPOFOL;  Surgeon: Corbin Ade, MD;  Location: [facility]  ENDO [facility];  Service: Endoscopy;  Laterality: N/A;  1:15pm  . ESOPHAGOGASTRODUODENOSCOPY (EGD) WITH PROPOFOL N/A 05/02/2019   Procedure: ESOPHAGOGASTRODUODENOSCOPY (EGD) WITH PROPOFOL;  Surgeon: Corbin Ade, MD;  Location: [facility];  Service: Endoscopy;  Laterality: N/A;  . EYE SURGERY    . TONSILLECTOMY AND ADENOIDECTOMY Bilateral 11/16/2018   Procedure: TONSILLECTOMY AND ADENOIDECTOMY;  Surgeon: Newman Pies, MD;  Location: [facility];  Service: ENT;  Laterality: Bilateral;     OB History    Gravida  0   Para  0   Term  0   Preterm  0   AB  0   Living  0     SAB  0   TAB  0   Ectopic  0   Multiple  0   Live Births  0            Home Medications    Prior to Admission medications   Medication Sig Start Date End Date Taking? Authorizing Provider  cephALEXin (KEFLEX) 500 MG capsule Take 1 capsule (500 mg total) by mouth 3 (three) times daily for 14 days. 05/23/19 06/06/19  Cheek, Vesta Mixer, PA-C  Ferrous Sulfate (IRON PO) Take 18 mg by mouth daily. Gummiest (2 gummies)    [provider]  fluticasone (FLONASE) 50 MCG/ACT nasal spray Place into both nostrils as needed for allergies or rhinitis.    [provider]  medroxyPROGESTERone (PROVERA) 10 MG tablet Take 1 tablet (10 mg total)  by mouth daily. 05/05/19   Adline PotterGriffin, Jennifer A, NP  Multiple Vitamin (MULTIVITAMIN WITH MINERALS) TABS tablet Take 1 tablet by mouth daily. One-A-Day Womens    [provider]  omeprazole (PRILOSEC) 40 MG capsule TAKE 1 CAPSULE BY MOUTH  DAILY Patient taking differently: Take 40 mg by mouth 2 (two) times daily.  04/04/19   Junie SpencerHawks, Christy A, FNP  venlafaxine XR (EFFEXOR-XR) 75 MG 24 hr capsule TAKE 1 CAPSULE BY MOUTH  DAILY WITH BREAKFAST Patient taking differently: Take 75 mg by mouth daily with breakfast.  03/21/19   Junie SpencerHawks, Christy A, FNP    Family History Family History  Problem Relation Age of Onset  . Diabetes Mother   . Colon cancer  Mother        ? patient isn't sure. Thinks mom had this when she was younger (<50 y.o.)  . Cancer Father        prostate  . Depression Sister   . Cancer Sister        bladder  . Heart disease Paternal Grandfather   . Colon polyps Neg Hx     Social History Social History   Tobacco Use  . Smoking status: Never Smoker  . Smokeless tobacco: Never Used  Substance Use Topics  . Alcohol use: Not Currently  . Drug use: No     Allergies   Poison oak extract [poison oak extract], Chocolate, and Eggs or egg-derived products   Review of Systems Review of Systems  Constitutional: Negative for chills and fever.  Respiratory: Negative for shortness of breath.   Cardiovascular: Negative for chest pain.  Gastrointestinal: Negative for abdominal distention, abdominal pain, diarrhea, nausea and vomiting.  Genitourinary: Positive for flank pain. Negative for decreased urine volume, difficulty urinating, dysuria, frequency, vaginal discharge and vaginal pain.  Musculoskeletal: Positive for back pain and myalgias.  Skin: Negative for rash.  All other systems reviewed and are negative.    Physical Exam Updated Vital Signs BP 130/82 (BP Location: Left Arm)   Pulse (!) 112   Temp 98.6 F (37 C) (Oral)   Resp 16   Ht 5\' 4"  (1.626 m)   Wt 98 kg   SpO2 98%   BMI 37.08 kg/m   Physical Exam Vitals signs and nursing note reviewed.  Constitutional:      General: She is not in acute distress.    Appearance: She is not toxic-appearing.     Comments: Appears uncomfortable in the chair.  HENT:     Head: Normocephalic.  Eyes:     Pupils: Pupils are equal, round, and reactive to light.  Neck:     Musculoskeletal: Neck supple.  Cardiovascular:     Rate and Rhythm: Regular rhythm. Tachycardia present.     Pulses: Normal pulses.     Heart sounds: Normal heart sounds. No murmur. No friction rub. No gallop.   Pulmonary:     Effort: Pulmonary effort is normal.     Breath sounds: Normal  breath sounds.  Abdominal:     General: Abdomen is flat. Bowel sounds are normal. There is no distension.     Palpations: Abdomen is soft.     Tenderness: There is no abdominal tenderness. There is right CVA tenderness. There is no left CVA tenderness, guarding or rebound.     Comments: Abdomen soft, nondistended, nontender to palpation in all quadrants without guarding or peritoneal signs. No rebound. Negative Murphy sign.   Musculoskeletal:     Right lower leg: Edema present.  Left lower leg: Edema present.     Comments: Reproducible pain in right mid back and flank region. 2+ pitting edema bilaterally. Distal sensation and pulses intact bilaterally.  Neurological:     General: No focal deficit present.     Mental Status: She is alert.      ED Treatments / Results  Labs (all labs ordered are listed, but only abnormal results are displayed) Labs Reviewed  CBC - Abnormal; Notable for the following components:      Result Value   RDW 17.4 (*)    Platelets 450 (*)    All other components within normal limits  URINALYSIS, ROUTINE W REFLEX MICROSCOPIC - Abnormal; Notable for the following components:   Nitrite POSITIVE (*)    Bacteria, UA MANY (*)    All other components within normal limits  LIPASE, BLOOD  COMPREHENSIVE METABOLIC PANEL  PREGNANCY, URINE    EKG None  Radiology Ct Renal Stone Study  Result Date: 05/23/2019 CLINICAL DATA:  Right-sided abdominal and flank pain. Suspected stone disease. EXAM: CT ABDOMEN AND PELVIS WITHOUT CONTRAST TECHNIQUE: Multidetector CT imaging of the abdomen and pelvis was performed following the standard protocol without IV contrast. COMPARISON:  None. FINDINGS: Lower chest: No acute findings. Hepatobiliary: No mass visualized on this unenhanced exam. Gallbladder is unremarkable. No evidence of biliary ductal dilatation. Pancreas: No mass or inflammatory process visualized on this unenhanced exam. Spleen:  Within normal limits in size.  Adrenals/Urinary tract: No evidence of urolithiasis or hydronephrosis. Unremarkable unopacified urinary bladder. Stomach/Bowel: No evidence of obstruction, inflammatory process, or abnormal fluid collections. Vascular/Lymphatic: No pathologically enlarged lymph nodes identified. No evidence of abdominal aortic aneurysm. Reproductive:  No mass or other significant abnormality. Other:  None. Musculoskeletal:  No suspicious bone lesions identified. IMPRESSION: Negative. No evidence of urolithiasis, hydronephrosis, or other acute findings. Electronically Signed   By: Marlaine Hind M.D.   On: 05/23/2019 20:21    Procedures Procedures (including critical care time)  Medications Ordered in ED Medications  ketorolac (TORADOL) 30 MG/ML injection 30 mg (30 mg Intravenous Given 05/23/19 2052)     Initial Impression / Assessment and Plan / ED Course  I have reviewed the triage vital signs and the nursing notes.  Pertinent labs & imaging results that were available during my care of the patient were reviewed by me and considered in my medical decision making (see chart for details).       23 year old female presents to the ED for an evaluation of right flank and right back pain.  Patient denies history of kidney stones.  Patient denies urinary symptoms.  Patient is tachycardic at 112, afebrile, maintaining O2 saturations at 98% on room air.  Patient in no acute distress but appears very uncomfortable in chair.  Positive CVA tenderness on the right.  Abdomen soft, nondistended, nontender.  Will order routine labs and UA.  We will also order renal study to rule out kidney stone. Suspect kidney stone vs. Pyelonephritis vs. MSK etiology.  Renal study personally reviewed which is negative for kidney stones.  UA positive for signs of infection with nitrites and many bacteria, but no hematuria. CBC reassuring with no leukocytosis. CMP completely unremarkable. Lipase normal. Pregnancy test negative. Will discharge  patient home with Keflex x 14 days for pyelonephritis. Patient has been instructed to follow-up with PCP within the next week if her symptoms do not improve. Strict ED precautions discussed with patient. Patient states understanding and agrees to plan. Patient discharged home in no acute  distress and stable vitals  Final Clinical Impressions(s) / ED Diagnoses   Final diagnoses:  Pyelonephritis    ED Discharge Orders         Ordered    cephALEXin (KEFLEX) 500 MG capsule  3 times daily     05/23/19 2046           Lorelle Formosa 05/24/19 0306    Derwood Kaplan, MD 05/26/19 [phone]

## 2019-05-23 NOTE — Telephone Encounter (Signed)
Spoke with pt. Pt had her TCS and EGD on 05/02/2019. Pt states that she is having sharp pain on her rt side. When asked if the sharp pain was upper or lower, pt said it is the sharp pain is on the rt side of her abdomen and is in the middle. Pt describes pain as severe at a 9 with 10 being the highest. Pt is able to eat and is taking Prilosec bid as directed. Pt doesn't know what could be causing the pain and says the pain is constant.  Pt says this pain has been present since her procedure. Pt is aware that if her symptoms worsen, she should go to the ED. Pt reports no nausea or vomiting.

## 2019-05-23 NOTE — ED Notes (Signed)
Pt reports "feeling bad" since colonoscopy   Here for eval after calling the office and being told to come to ED

## 2019-05-23 NOTE — ED Triage Notes (Signed)
Es she has been having right flank pain since having gi studies done but it worsened on Saturday. No n/v/d. No urinary s/s.

## 2019-05-23 NOTE — Telephone Encounter (Signed)
PATIENT RETURNED CALL  

## 2019-05-23 NOTE — Telephone Encounter (Signed)
[phone] PATIENT CALLED STATING SHE HAS HAD A PAIN IN HER SIDE SINCE HER PROCEDURE AND IT IS GETTING WORSE.  SAID SHE CAN NOT STAND, EAT OR LAY DOWN

## 2019-05-23 NOTE — ED Notes (Signed)
From CT 

## 2019-05-23 NOTE — Telephone Encounter (Signed)
Reviewed recent TCS and EGD report. TCS was normal. Patient did have a few gastric polyps removed and her stomach biopsied but I doubt this is causing her abdominal pain. See patient also sent a message to PCP stating she had pain with sitting, standing, and breathing. PCP advised she go to the ED is pain was worse with breathing.   With persistence and severity of pain, I agree with recommendations to proceed to the ED. It looks like patient checked into the ED around 3 pm today. No notes have been put in yet.

## 2019-05-23 NOTE — Discharge Instructions (Signed)
Today you were diagnosed with pyelonephritis which is a kidney infection. They typically start as UTIs and then work their way up. Your CT scan was negative for kidney stones. I am prescribing you with an antibiotic. You will take it 3 times a day for 14 days. Follow-up with your PCP if your symptoms do not improve within the next week. Return to the ER for new or worsening symptoms.

## 2019-05-23 NOTE — Telephone Encounter (Signed)
Lmom, waiting on a return call.  

## 2019-05-24 NOTE — Telephone Encounter (Signed)
Noted  

## 2019-05-25 ENCOUNTER — Encounter: Payer: Self-pay | Admitting: Adult Health

## 2019-05-25 ENCOUNTER — Ambulatory Visit (INDEPENDENT_AMBULATORY_CARE_PROVIDER_SITE_OTHER): Payer: Commercial Managed Care - PPO | Admitting: Adult Health

## 2019-05-25 ENCOUNTER — Other Ambulatory Visit: Payer: Self-pay

## 2019-05-25 VITALS — BP 120/76 | HR 97 | Ht 64.2 in | Wt 230.0 lb

## 2019-05-25 DIAGNOSIS — N926 Irregular menstruation, unspecified: Secondary | ICD-10-CM | POA: Diagnosis not present

## 2019-05-25 DIAGNOSIS — Z319 Encounter for procreative management, unspecified: Secondary | ICD-10-CM | POA: Diagnosis not present

## 2019-05-25 DIAGNOSIS — Z131 Encounter for screening for diabetes mellitus: Secondary | ICD-10-CM

## 2019-05-25 DIAGNOSIS — Z3202 Encounter for pregnancy test, result negative: Secondary | ICD-10-CM | POA: Diagnosis not present

## 2019-05-25 LAB — POCT URINE PREGNANCY: Preg Test, Ur: NEGATIVE

## 2019-05-25 NOTE — Telephone Encounter (Signed)
Please call the patient and ask her about her pain. Please get more detains: (constant, intermittent? How bad? Still ongoing? Any blood in the stools?)

## 2019-05-25 NOTE — Progress Notes (Signed)
  Subjective:     Patient ID: Dana Lane, female   DOB: October 06, 1995, 23 y.o.   MRN: [id number]  HPI Dana Lane is a 23 year old, married, G0P0 back in follow up of taking provera, and wiped blood for 3 days.  PCP  Is christy Lenna Gilford NP.   Review of Systems Had spotting when wiped for 3 days starting 11/25, took provera No cramping  Reviewed past medical,surgical, social and family history. Reviewed medications and allergies.     Objective:   Physical Exam BP 120/76 (BP Location: Left Arm, Patient Position: Sitting, Cuff Size: Large)   Pulse 97   Ht 5' 4.2" (1.631 m)   Wt 230 lb (104.3 kg)   LMP 05/18/2019 Comment: when wiped   BMI 39.23 kg/m  UPT negative  Skin warm and dry. Lungs: clear to ausculation bilaterally. Cardiovascular: regular rate and rhythm.    Assessment:     1. Pregnancy test negative  2. Missed periods   3. Patient desires pregnancy Take OTC PNV Check progesterone level 12/15   4. Screening for diabetes mellitus Check A1c    Plan:     Will talk when labs back, if not ovulated, may cycle with OCs for 3 months or consider clomid  Follow up prn

## 2019-05-31 ENCOUNTER — Other Ambulatory Visit (HOSPITAL_COMMUNITY): Payer: Commercial Managed Care - PPO

## 2019-06-08 LAB — HEMOGLOBIN A1C
Est. average glucose Bld gHb Est-mCnc: 117 mg/dL
Hgb A1c MFr Bld: 5.7 % — ABNORMAL HIGH (ref 4.8–5.6)

## 2019-06-08 LAB — PROGESTERONE: Progesterone: 0.2 ng/mL

## 2019-06-28 ENCOUNTER — Encounter: Payer: Self-pay | Admitting: Nurse Practitioner

## 2019-06-28 ENCOUNTER — Other Ambulatory Visit: Payer: Self-pay

## 2019-06-28 ENCOUNTER — Ambulatory Visit (INDEPENDENT_AMBULATORY_CARE_PROVIDER_SITE_OTHER): Payer: Self-pay | Admitting: Nurse Practitioner

## 2019-06-28 VITALS — BP 132/83 | HR 91 | Temp 96.9°F | Ht 64.5 in | Wt 227.2 lb

## 2019-06-28 DIAGNOSIS — D649 Anemia, unspecified: Secondary | ICD-10-CM | POA: Diagnosis not present

## 2019-06-28 DIAGNOSIS — K625 Hemorrhage of anus and rectum: Secondary | ICD-10-CM

## 2019-06-28 DIAGNOSIS — K219 Gastro-esophageal reflux disease without esophagitis: Secondary | ICD-10-CM

## 2019-06-28 MED ORDER — PANTOPRAZOLE SODIUM 40 MG PO TBEC: 40.0000 mg | DELAYED_RELEASE_TABLET | Freq: Two times a day (BID) | ORAL | 5 refills | Status: DC

## 2019-06-28 NOTE — Progress Notes (Signed)
Referring Provider: Sharion Balloon, FNP Primary Care Physician:  Sharion Balloon, FNP Primary GI:  Dr. Gala Romney  Chief Complaint  Patient presents with  . Rectal Bleeding    none  . Gastroesophageal Reflux    doing okay    HPI:   Dana Lane is a 24 y.o. female who presents for 53-month post procedure follow-up.  The patient was last seen in our office 03/17/2019 for upper abdominal pain, GERD, rectal bleeding.  At that time noted chronic history of GERD generally well controlled on omeprazole 40 mg for several years who presented with worsening epigastric and left upper quadrant pain about twice a week without identified triggers.  No recent NSAID use.  Also right upper quadrant pain that began the day before.  Noted 3 episodes of bright red blood per rectum a few weeks prior.  Soft formed stools once a week is her baseline with no change in abdominal pain with BMs.  Mild tenderness in the upper abdomen.  CBC on 03/04/2019 demonstrated hemoglobin declined to 11.3 from 13.1 in January 2020.  Also noted slightly low MCH of 26.0.  Query gastritis, esophagitis, duodenitis, peptic ulcer disease, H. pylori.  Possibly biliary etiology and less likely pancreatitis.  Recommended increase omeprazole to 40 mg daily, avoid all NSAIDs, GERD handout provided, check lipase and abdominal ultrasound as well as CBC and iron panel.  Labs completed 03/23/2019 which found normal lipase, hemoglobin with some more slight decline to 11.1 found to be microcytic and hypochromic.  Iron panel found low iron at 31, low sat 88%, low ferritin at 9.  Eleven months prior her ferritin was 25 and other indices were normal.  Recommended colonoscopy and upper endoscopy.  Colonoscopy completed 05/02/2019 which found entire colon appeared normal, suspected trivial rectal bleeding.  EGD found normal esophagus, multiple gastric polyps status post resection and biopsy, abnormal gastric mucosa of doubtful clinical significance status post  biopsy, normal duodenum.  Surgical pathology found the polyps to be chronic inactive gastritis.  Stomach polyp was found to be fundic gland polyp.  Most recent CBC drawn on 05/23/2019 found hemoglobin normalized to 13.1.  Today she states she's doing well overall. Her GERD is well managed on PPI. She has not had any further rectal bleeding. She is currently on omeprazole bid which controls her GERD well. Rare breakthrough GERD, no known triggers; acid chews as needed helps sometime. She is having some belching with any fluids. Still has LUQ and epigastric discomfort which occurs "quite often." Denies N/V, hematochezia, melena, fever, chills, unintentional weight loss. Has irregular bowel movements, maybe once a week which is consistent with [place] 4, empties completely. Drinks at most 5 glasses a day of water but often less; is trying to add more water into her diet. Eats minimal fiber daily. Denies URI or flu-like symptoms. Denies loss of sense of taste or smell. Denies chest pain, dyspnea, dizziness, lightheadedness, syncope, near syncope. Denies any other upper or lower GI symptoms.  Past Medical History:  Diagnosis Date  . Ankle sprain    right  . Depression   . Fatty liver disease, nonalcoholic   . GERD (gastroesophageal reflux disease)   . Tonsillitis     Past Surgical History:  Procedure Laterality Date  . BIOPSY  05/02/2019   Procedure: BIOPSY;  Surgeon: Daneil Dolin, MD;  Location: [facility];  Service: Endoscopy;;  gastric polyps   . COLONOSCOPY WITH PROPOFOL N/A 05/02/2019   Procedure: COLONOSCOPY WITH PROPOFOL;  Surgeon: Corbin Ade, MD;  Location: [facility];  Service: Endoscopy;  Laterality: N/A;  1:15pm  . ESOPHAGOGASTRODUODENOSCOPY (EGD) WITH PROPOFOL N/A 05/02/2019   Procedure: ESOPHAGOGASTRODUODENOSCOPY (EGD) WITH PROPOFOL;  Surgeon: Corbin Ade, MD;  Location: [facility];  Service: Endoscopy;  Laterality: N/A;  . EYE SURGERY    . TONSILLECTOMY AND  ADENOIDECTOMY Bilateral 11/16/2018   Procedure: TONSILLECTOMY AND ADENOIDECTOMY;  Surgeon: Newman Pies, MD;  Location: [facility];  Service: ENT;  Laterality: Bilateral;    Current Outpatient Medications  Medication Sig Dispense Refill  . Ferrous Sulfate (IRON PO) Take 18 mg by mouth daily. Gummiest (2 gummies)    . fluticasone (FLONASE) 50 MCG/ACT nasal spray Place into both nostrils as needed for allergies or rhinitis.    . medroxyPROGESTERone (PROVERA) 10 MG tablet Take 1 tablet (10 mg total) by mouth daily. 10 tablet 0  . omeprazole (PRILOSEC) 40 MG capsule TAKE 1 CAPSULE BY MOUTH  DAILY (Patient taking differently: Take 40 mg by mouth 2 (two) times daily. ) 90 capsule 1  . Prenatal MV-Min-Fe Fum-FA-DHA (PRENATAL 1 PO) Take by mouth daily.    Marland Kitchen venlafaxine XR (EFFEXOR-XR) 75 MG 24 hr capsule TAKE 1 CAPSULE BY MOUTH  DAILY WITH BREAKFAST (Patient taking differently: Take 75 mg by mouth daily with breakfast. ) 90 capsule 1   No current facility-administered medications for this visit.    Allergies as of 06/28/2019 - Review Complete 06/28/2019  Allergen Reaction Noted  . Poison oak extract [poison oak extract] Itching and Rash 01/09/2016  . Chocolate Itching and Swelling 12/07/2014  . Eggs or egg-derived products Diarrhea and Other (See Comments) 12/07/2014    Family History  Problem Relation Age of Onset  . Diabetes Mother   . Cervical cancer Mother        ? patient isn't sure. Thinks mom had this when she was younger (<50 y.o.)  . Cancer Father        prostate  . Depression Sister   . Cancer Sister        bladder  . Heart disease Paternal Grandfather   . Colon polyps Neg Hx   . Colon cancer Neg Hx     Social History   Socioeconomic History  . Marital status: Single    Spouse name: Not on file  . Number of children: Not on file  . Years of education: 54  . Highest education level: Not on file  Occupational History  . Not on file  Tobacco Use  . Smoking  status: Never Smoker  . Smokeless tobacco: Never Used  Substance and Sexual Activity  . Alcohol use: Not Currently  . Drug use: No  . Sexual activity: Yes    Birth control/protection: Condom  Other Topics Concern  . Not on file  Social History Narrative  . Not on file   Social Determinants of Health   Financial Resource Strain:   . Difficulty of Paying Living Expenses: Not on file  Food Insecurity:   . Worried About Programme researcher, broadcasting/film/video in the Last Year: Not on file  . Ran Out of Food in the Last Year: Not on file  Transportation Needs:   . Lack of Transportation (Medical): Not on file  . Lack of Transportation (Non-Medical): Not on file  Physical Activity:   . Days of Exercise per Week: Not on file  . Minutes of Exercise per Session: Not on file  Stress:   . Feeling of Stress :  Not on file  Social Connections:   . Frequency of Communication with Friends and Family: Not on file  . Frequency of Social Gatherings with Friends and Family: Not on file  . Attends Religious Services: Not on file  . Active Member of Clubs or Organizations: Not on file  . Attends Banker Meetings: Not on file  . Marital Status: Not on file    Review of Systems: General: Negative for anorexia, weight loss, fever, chills, fatigue, weakness. ENT: Negative for hoarseness, difficulty swallowing. CV: Negative for chest pain, angina, palpitations, peripheral edema.  Respiratory: Negative for dyspnea at rest, cough, sputum, wheezing.  GI: See history of present illness. Endo: Negative for unusual weight change.  Heme: Negative for bruising or bleeding. Allergy: Negative for rash or hives.   Physical Exam: BP 132/83   Pulse 91   Temp (!) 96.9 F (36.1 C) (Oral)   Ht 5' 4.5" (1.638 m)   Wt 227 lb 3.2 oz (103.1 kg)   LMP 01/10/2019   BMI 38.40 kg/m  General:   Alert and oriented. Pleasant and cooperative. Well-nourished and well-developed.  Eyes:  Without icterus, sclera clear and  conjunctiva pink.  Ears:  Normal auditory acuity. Cardiovascular:  S1, S2 present without murmurs appreciated. Extremities without clubbing or edema. Respiratory:  Clear to auscultation bilaterally. No wheezes, rales, or rhonchi. No distress.  Gastrointestinal:  +BS, soft, and non-distended. Mild epigastric and LUQ TTP. No HSM noted. No guarding or rebound. No masses appreciated.  Rectal:  Deferred  Musculoskalatal:  Symmetrical without gross deformities. Neurologic:  Alert and oriented x4;  grossly normal neurologically. Psych:  Alert and cooperative. Normal mood and affect. Heme/Lymph/Immune: No excessive bruising noted.    06/28/2019 10:03 AM   Disclaimer: This note was dictated with voice recognition software. Similar sounding words can inadvertently be transcribed and may not be corrected upon review.

## 2019-06-28 NOTE — Patient Instructions (Signed)
Your health issues we discussed today were:   Persistent GERD (reflux/heartburn): 1. Stop taking omeprazole 2. I sent a prescription to your pharmacy for Protonix 40 mg.  Take this twice a day: First thing in the morning and 30 minutes before your last meal the day 3. Avoid foods that make your reflux worse 4. Avoid laying flat 3 to 4 hours after your last meal 5. You can use Tums and other acid chews as needed 6. Call us with a progress report in 3 to 4 weeks and let us know if Protonix is working better  Previous rectal bleeding with previous anemia: 1. As we discussed your colonoscopy looked good and felt likely "trivial bleeding" 2. Your labs with your blood level have now become normal again 3. Call us if you see any other further bleeding  Overall I recommend:  1. Return for follow-up in 6 months 2. Continue your other current medications 3. Call us if you have any questions or concerns.   Because of recent events of COVID-19 ("Coronavirus"), follow CDC recommendations:  Wash your hand frequently Avoid touching your face Stay away from people who are sick If you have symptoms such as fever, cough, shortness of breath then call your healthcare provider for further guidance If you are sick, STAY AT HOME unless otherwise directed by your healthcare provider. Follow directions from state and national officials regarding staying safe   At [facility] we value your feedback. You may receive a survey about your visit today. Please share your experience as we strive to create trusting relationships with our patients to provide genuine, compassionate, quality care.  We appreciate your understanding and patience as we review any laboratory studies, imaging, and other diagnostic tests that are ordered as we care for you. Our office policy is 5 business days for review of these results, and any emergent or urgent results are addressed in a timely manner for your best  interest. If you do not hear from our office in 1 week, please contact us.   We also encourage the use of MyChart, which contains your medical information for your review as well. If you are not enrolled in this feature, an access code is on this after visit summary for your convenience. Thank you for allowing [place] to be involved in your care.  It was great to see you today!  I hope you have a Happy New Year!!

## 2019-06-28 NOTE — Assessment & Plan Note (Signed)
Noted iron deficiency anemia documented on labs in September 2020.  Colonoscopy and endoscopy essentially normal and felt to be trivial bleeding.  Her hemoglobin has since rebounded and is now normalized.  She is currently on iron supplements.  Recommend she continue her current medications, consider checking iron panel again in 6 months at her follow-up.  Call for any worsening or recurrent bleeding

## 2019-06-28 NOTE — Progress Notes (Signed)
Cc'ed to pcp °

## 2019-06-28 NOTE — Assessment & Plan Note (Signed)
No recent rectal bleeding.  Colonoscopy essentially normal and felt to be trivial bleeding.  She is on iron supplement and her hemoglobin has rebounded and normalized.  Recommend she continue her current medications, follow-up in 6 months, call us for any worsening or significant rectal bleeding before then.

## 2019-06-28 NOTE — Assessment & Plan Note (Signed)
GERD symptoms not doing very well on omeprazole twice daily.  She still has reflux type heartburn symptoms with fluids.  She has persistent epigastric, left upper quadrant, esophageal discomfort.  She has not been on any other PPI.  At this point I will have her stop omeprazole and start Protonix 40 mg twice daily.  Continue Tums and other rescue medicines as needed.  Progress report in 2 weeks.  Can try other PPIs if no response to Protonix.  Follow-up in 6 months.

## 2019-06-29 ENCOUNTER — Other Ambulatory Visit: Payer: Self-pay

## 2019-06-30 ENCOUNTER — Encounter: Payer: Self-pay | Admitting: Family

## 2019-06-30 ENCOUNTER — Ambulatory Visit (INDEPENDENT_AMBULATORY_CARE_PROVIDER_SITE_OTHER): Payer: Self-pay | Admitting: Family

## 2019-06-30 ENCOUNTER — Other Ambulatory Visit: Payer: Self-pay

## 2019-06-30 VITALS — BP 124/85 | HR 105 | Temp 98.9°F | Ht 64.5 in | Wt 232.0 lb

## 2019-06-30 DIAGNOSIS — N12 Tubulo-interstitial nephritis, not specified as acute or chronic: Secondary | ICD-10-CM

## 2019-06-30 DIAGNOSIS — Z09 Encounter for follow-up examination after completed treatment for conditions other than malignant neoplasm: Secondary | ICD-10-CM

## 2019-06-30 DIAGNOSIS — B373 Candidiasis of vulva and vagina: Secondary | ICD-10-CM

## 2019-06-30 DIAGNOSIS — B3731 Acute candidiasis of vulva and vagina: Secondary | ICD-10-CM

## 2019-06-30 LAB — URINALYSIS, COMPLETE
Bilirubin, UA: NEGATIVE
Glucose, UA: NEGATIVE
Ketones, UA: NEGATIVE
Nitrite, UA: NEGATIVE
Protein,UA: NEGATIVE
Specific Gravity, UA: 1.025 (ref 1.005–1.030)
Urobilinogen, Ur: 0.2 mg/dL (ref 0.2–1.0)
pH, UA: 5.5 (ref 5.0–7.5)

## 2019-06-30 LAB — MICROSCOPIC EXAMINATION: Renal Epithel, UA: NONE SEEN /hpf

## 2019-06-30 MED ORDER — FLUCONAZOLE 150 MG PO TABS: 150.0000 mg | ORAL_TABLET | ORAL | 0 refills | Status: DC | PRN

## 2019-06-30 MED ORDER — CIPROFLOXACIN HCL 500 MG PO TABS: 500.0000 mg | ORAL_TABLET | Freq: Two times a day (BID) | ORAL | 0 refills | Status: DC

## 2019-06-30 MED ORDER — CEFTRIAXONE SODIUM 1 G IJ SOLR
1.0000 g | Freq: Once | INTRAMUSCULAR | Status: AC
  Administered 2019-06-30: 16:00:00 1 g via INTRAMUSCULAR

## 2019-06-30 NOTE — Progress Notes (Signed)
Subjective:    Patient ID: Dana Lane, female    DOB: 14-Oct-1995, 24 y.o.   MRN: [id number]  Chief Complaint  Patient presents with  . right, mid back pain   Pt presents to the office today with recurrent right flank pain. She was seen in the ED on 05/23/19 and diagnosed with pyelonephritis. She was discharged on Keflex and told to follow up with pain did not resolve.   She states the pain did improved that it is intermittent vs constant.  Flank Pain This is a recurrent problem. The current episode started more than 1 month ago. The problem occurs intermittently. The problem has been waxing and waning since onset. The quality of the pain is described as stabbing. The pain does not radiate. The pain is at a severity of 8/10. The pain is moderate. The symptoms are aggravated by position. Pertinent negatives include no bladder incontinence, bowel incontinence, dysuria, fever, leg pain or numbness. (Urinary frequency ) Treatments tried: keflex. The treatment provided mild relief.      Review of Systems  Constitutional: Negative for fever.  Gastrointestinal: Negative for bowel incontinence.  Genitourinary: Positive for flank pain. Negative for bladder incontinence and dysuria.  Neurological: Negative for numbness.  All other systems reviewed and are negative.      Objective:   Physical Exam Vitals reviewed.  Constitutional:      General: She is not in acute distress.    Appearance: She is well-developed.  HENT:     Head: Normocephalic and atraumatic.     Right Ear: External ear normal.  Eyes:     Pupils: Pupils are equal, round, and reactive to light.  Neck:     Thyroid: No thyromegaly.  Cardiovascular:     Rate and Rhythm: Normal rate and regular rhythm.     Heart sounds: Normal heart sounds. No murmur.  Pulmonary:     Effort: Pulmonary effort is normal. No respiratory distress.     Breath sounds: Normal breath sounds. No wheezing.  Abdominal:     General: Bowel sounds  are normal. There is no distension.     Palpations: Abdomen is soft.     Tenderness: There is no abdominal tenderness. There is right CVA tenderness (mild).  Musculoskeletal:        General: No tenderness. Normal range of motion.     Cervical back: Normal range of motion and neck supple.  Skin:    General: Skin is warm and dry.  Neurological:     Mental Status: She is alert and oriented to person, place, and time.     Cranial Nerves: No cranial nerve deficit.     Deep Tendon Reflexes: Reflexes are normal and symmetric.  Psychiatric:        Behavior: Behavior normal.        Thought Content: Thought content normal.        Judgment: Judgment normal.       BP 124/85   Pulse (!) 105   Temp 98.9 F (37.2 C) (Temporal)   Ht 5' 4.5" (1.638 m)   Wt 232 lb (105.2 kg)   SpO2 100%   BMI 39.21 kg/m      Assessment & Plan:  [name] comes in today with chief complaint of right, mid back pain   Diagnosis and orders addressed:  1. Pyelonephritis Force fluids RTO 2 weeks to recheck Culture pending  - urinalysis- dip and micro - Urine culture - urinalysis- dip and micro - cefTRIAXone (ROCEPHIN)  injection 1 g - CBC with Differential/Platelet - ciprofloxacin (CIPRO) 500 MG tablet; Take 1 tablet (500 mg total) by mouth 2 (two) times daily.  Dispense: 14 tablet; Refill: 0  2. Hospital discharge follow-up  3. Vagina, candidiasis - fluconazole (DIFLUCAN) 150 MG tablet; Take 1 tablet (150 mg total) by mouth every three (3) days as needed.  Dispense: 3 tablet; Refill: 0     Jannifer Rodney, FNP

## 2019-06-30 NOTE — Patient Instructions (Signed)
Pyelonephritis, Adult  Pyelonephritis is an infection that occurs in the kidney. The kidneys are organs that help clean the blood by moving waste out of the blood and into the pee (urine). This infection can happen quickly, or it can last for a long time. In most cases, it clears up with treatment and does not cause other problems. What are the causes? This condition may be caused by:  Germs (bacteria) going from the bladder up to the kidney. This may happen after having a bladder infection.  Germs going from the blood to the kidney. What increases the risk? This condition is more likely to develop in:  Pregnant women.  Older people.  People who have any of these conditions: ? Diabetes. ? Inflammation of the prostate gland (prostatitis), in males. ? Kidney stones or bladder stones. ? Other problems with the kidney or the parts of your body that carry pee from the kidneys to the bladder (ureters). ? Cancer.  People who have a small, thin tube (catheter) placed in the bladder.  People who are sexually active.  Women who use a medicine that kills sperm (spermicide) to prevent pregnancy.  People who have had a prior urinary tract infection (UTI). What are the signs or symptoms? Symptoms of this condition include:  Peeing often.  A strong urge to pee right away.  Burning or stinging when peeing.  Belly pain.  Back pain.  Pain in the side (flank area).  Fever or chills.  Blood in the pee, or dark pee.  Feeling sick to your stomach (nauseous) or throwing up (vomiting). How is this treated? This condition may be treated by:  Taking antibiotic medicines by mouth (orally).  Drinking enough fluids. If the infection is bad, you may need to stay in the hospital. You may be given antibiotics and fluids that are put directly into a vein through an IV tube. In some cases, other treatments may be needed. Follow these instructions at home: Medicines  Take your antibiotic  medicine as told by your doctor. Do not stop taking the antibiotic even if you start to feel better.  Take over-the-counter and prescription medicines only as told by your doctor. General instructions   Drink enough fluid to keep your pee pale yellow.  Avoid caffeine, tea, and carbonated drinks.  Pee (urinate) often. Avoid holding in pee for long periods of time.  Pee before and after sex.  After pooping (having a bowel movement), women should wipe from front to back. Use each tissue only once.  Keep all follow-up visits as told by your doctor. This is important. Contact a doctor if:  You do not feel better after 2 days.  Your symptoms get worse.  You have a fever. Get help right away if:  You cannot take your medicine or drink fluids as told.  You have chills and shaking.  You throw up.  You have very bad pain in your side or back.  You feel very weak or you pass out (faint). Summary  Pyelonephritis is an infection that occurs in the kidney.  In most cases, this infection clears up with treatment and does not cause other problems.  Take your antibiotic medicine as told by your doctor. Do not stop taking the antibiotic even if you start to feel better.  Drink enough fluid to keep your pee pale yellow. This information is not intended to replace advice given to you by your health care provider. Make sure you discuss any questions you have with   your health care provider. Document Revised: 04/13/2018 Document Reviewed: 04/13/2018 Elsevier Patient Education  2020 [employer]  

## 2019-07-01 LAB — CBC WITH DIFFERENTIAL/PLATELET
Basophils Absolute: 0.1 10*3/uL (ref 0.0–0.2)
Basos: 1 %
EOS (ABSOLUTE): 0.2 10*3/uL (ref 0.0–0.4)
Eos: 2 %
Hematocrit: 39.1 % (ref 34.0–46.6)
Hemoglobin: 13.5 g/dL (ref 11.1–15.9)
Immature Grans (Abs): 0 10*3/uL (ref 0.0–0.1)
Immature Granulocytes: 0 %
Lymphocytes Absolute: 3.2 10*3/uL — ABNORMAL HIGH (ref 0.7–3.1)
Lymphs: 33 %
MCH: 29.3 pg (ref 26.6–33.0)
MCHC: 34.5 g/dL (ref 31.5–35.7)
MCV: 85 fL (ref 79–97)
Monocytes Absolute: 0.5 10*3/uL (ref 0.1–0.9)
Monocytes: 5 %
Neutrophils Absolute: 5.6 10*3/uL (ref 1.4–7.0)
Neutrophils: 59 %
Platelets: 373 10*3/uL (ref 150–450)
RBC: 4.61 x10E6/uL (ref 3.77–5.28)
RDW: 15.1 % (ref 11.7–15.4)
WBC: 9.6 10*3/uL (ref 3.4–10.8)

## 2019-07-01 LAB — URINE CULTURE

## 2019-07-05 ENCOUNTER — Other Ambulatory Visit: Payer: Self-pay | Admitting: Family

## 2019-07-05 DIAGNOSIS — M546 Pain in thoracic spine: Secondary | ICD-10-CM

## 2019-07-05 MED ORDER — BACLOFEN 10 MG PO TABS: 10.0000 mg | ORAL_TABLET | Freq: Three times a day (TID) | ORAL | 0 refills | Status: DC

## 2019-07-07 ENCOUNTER — Other Ambulatory Visit: Payer: Self-pay

## 2019-07-07 ENCOUNTER — Ambulatory Visit: Payer: 59 | Attending: Family | Admitting: Physical Therapy

## 2019-07-07 DIAGNOSIS — R293 Abnormal posture: Secondary | ICD-10-CM

## 2019-07-07 DIAGNOSIS — M546 Pain in thoracic spine: Secondary | ICD-10-CM | POA: Diagnosis not present

## 2019-07-07 NOTE — Therapy (Signed)
[facility] [place], [place], [address] Phone: [phone]   Fax:  [phone]  Physical Therapy Evaluation  Patient Details  Name: Dana Lane MRN: [id number] Date of Birth: 1995/12/23 Referring Provider (PT): Evelina Dun   Encounter Date: 07/07/2019  PT End of Session - 07/07/19 1212    Visit Number  1    Number of Visits  12    Date for PT Re-Evaluation  08/25/19    PT Start Time  0900    PT Stop Time  0928    PT Time Calculation (min)  28 min    Activity Tolerance  Patient tolerated treatment well    Behavior During Therapy  [facility] for tasks assessed/performed       Past Medical History:  Diagnosis Date  . Ankle sprain    right  . Depression   . Fatty liver disease, nonalcoholic   . GERD (gastroesophageal reflux disease)   . Tonsillitis     Past Surgical History:  Procedure Laterality Date  . BIOPSY  05/02/2019   Procedure: BIOPSY;  Surgeon: Daneil Dolin, MD;  [facility];  Service: Endoscopy;;  gastric polyps   . COLONOSCOPY WITH PROPOFOL N/A 05/02/2019   Procedure: COLONOSCOPY WITH PROPOFOL;  Surgeon: Daneil Dolin, MD;  [facility];  Service: Endoscopy;  Laterality: N/A;  1:15pm  . ESOPHAGOGASTRODUODENOSCOPY (EGD) WITH PROPOFOL N/A 05/02/2019   Procedure: ESOPHAGOGASTRODUODENOSCOPY (EGD) WITH PROPOFOL;  Surgeon: Daneil Dolin, MD;  [facility];  Service: Endoscopy;  Laterality: N/A;  . EYE SURGERY    . TONSILLECTOMY AND ADENOIDECTOMY Bilateral 11/16/2018   Procedure: TONSILLECTOMY AND ADENOIDECTOMY;  Surgeon: Leta Baptist, MD;  Location: [place];  Service: ENT;  Laterality: Bilateral;    There were no vitals filed for this visit.   Subjective Assessment - 07/07/19 0926    Subjective  COVID-19 screen performed prior to patient entering clinic.  The patient presents to the clini today with c/o right sided thoracic pain radiating into outward toward her  rib area.  She reports feeling this pain after a colonscopy.  She rates her pain at a 7/10.  She has found really nothing decreases her pain and certain movements increase her pain.    Pertinent History  Ankel injury, eye surgery.    Patient Stated Goals  get out of pain.    Currently in Pain?  Yes    Pain Score  7     Pain Location  Back    Pain Orientation  Right    Pain Descriptors / Indicators  Sharp    Pain Type  Acute pain    Pain Onset  More than a month ago    Pain Frequency  Constant    Aggravating Factors   See above.    Pain Relieving Factors  See above.         [facility] PT Assessment - 07/07/19 0001      Assessment   Medical Diagnosis  Acute thoracic back pain.    Referring Provider (PT)  Evelina Dun    Onset Date/Surgical Date  --   05/02/19     Precautions   Precautions  None      Restrictions   Weight Bearing Restrictions  No      Balance Screen   Has the patient fallen in the past 6 months  No    Has the patient had a decrease in activity level because of a  fear of falling?   No    Is the patient reluctant to leave their home because of a fear of falling?   No      Home Environment   Living Environment  Private residence      Posture/Postural Control   Posture/Postural Control  Postural limitations    Postural Limitations  Rounded Shoulders;Forward head      Deep Tendon Reflexes   DTR Assessment Site  Biceps;Brachioradialis;Triceps    Biceps DTR  2+    Brachioradialis DTR  --   RT= 1+/4+, LT 2+/4+.   Triceps DTR  2+      ROM / Strength   AROM / PROM / Strength  AROM;Strength      AROM   Overall AROM Comments  Normal bilateral UE range of motion.      Strength   Overall Strength Comments  Right lower trap strength= 4/5 likely decreased due to pain reproduction.      Palpation   Palpation comment  Tender to palpation at ~T8 to T10 with radiation out right and laterally to ribs.      Ambulation/Gait   Gait Comments  WNL.                 Objective measurements completed on examination: See above findings.                   PT Long Term Goals - 07/07/19 1223      PT LONG TERM GOAL #1   Title  Patient will be independent with HEP    Baseline  No knowledge of appropriate ther ex.    Time  6    Period  Weeks    Status  New      PT LONG TERM GOAL #2   Title  Perform ADL's with pain not > 2-3/10.    Baseline  Pain rise to very high levels with performance of ADL's.    Time  6    Period  Weeks    Status  New      PT LONG TERM GOAL #3   Title  Eliminate pain radiation into right rib region.    Baseline  Pain radiates fronm patient's lower thoracic region to right rib region.    Time  6    Period  Weeks    Status  New             Plan - 07/07/19 1217    Clinical Impression Statement  The patient presents to OPPT with c/o right sided mid-back pain after waking up from a Colonscopy on 05/02/19.  She experiences radiation of pain from ~T8 to T10 into her right lateral rib area.  Her pain has impaired her functional mobiliuty and she hs not found anything to effecteively decrease her pain.  Patient will benefit from skilled physical therapy intervention to address deficits and pain.    Examination-Activity Limitations  Reach Overhead    Examination-Participation Restrictions  Other    Stability/Clinical Decision Making  Evolving/Moderate complexity    Clinical Decision Making  Low    Rehab Potential  Excellent    PT Frequency  2x / week    PT Duration  6 weeks    PT Treatment/Interventions  ADLs/Self Care Home Management;Cryotherapy;Electrical Stimulation;Ultrasound;Moist Heat;Therapeutic activities;Manual techniques;Patient/family education;Joint Manipulations    PT Next Visit Plan  Thoracic and costovertebral mobs.  HMP, electrical stimulation.  Combo e'stim/U/S and STW/M.  Thoracic strengthening.    Consulted and Agree  with Plan of Care  Patient       Patient will benefit  from skilled therapeutic intervention in order to improve the following deficits and impairments:  Pain, Decreased activity tolerance, Decreased strength  Visit Diagnosis: Pain in thoracic spine - Plan: PT plan of care cert/re-cert  Abnormal posture - Plan: PT plan of care cert/re-cert     Problem List Patient Active Problem List   Diagnosis Date Noted  . Anemia 06/28/2019  . Pregnancy test negative 05/25/2019  . Missed periods 05/05/2019  . Weight gain 05/05/2019  . Patient desires pregnancy 05/05/2019  . Rectal bleeding 03/17/2019  . Pain of upper abdomen 03/17/2019  . MDD (major depressive disorder), recurrent episode, moderate (HCC) 03/25/2018  . Obesity (BMI 30-39.9) 12/11/2017  . Chest wall pain 09/14/2015  . Gastroesophageal reflux disease 06/18/2015    Terrion Poblano, [place] MPT 07/07/2019, 12:27 PM  [facility] [address] [place], [place], [address] Phone: [phone]   Fax:  [phone]  Name: Naesha Buckalew MRN: [id number] Date of Birth: 09/30/1995

## 2019-07-15 ENCOUNTER — Encounter: Payer: Managed Care, Other (non HMO) | Admitting: Physical Therapy

## 2019-07-18 ENCOUNTER — Ambulatory Visit: Payer: 59 | Admitting: Physical Therapy

## 2019-07-18 ENCOUNTER — Other Ambulatory Visit: Payer: Self-pay

## 2019-07-18 DIAGNOSIS — M546 Pain in thoracic spine: Secondary | ICD-10-CM

## 2019-07-18 DIAGNOSIS — R293 Abnormal posture: Secondary | ICD-10-CM

## 2019-07-18 NOTE — Therapy (Signed)
[facility] [place], [place], [address] Phone: [phone]   Fax:  [phone]  Physical Therapy Treatment  Patient Details  Name: Dana Lane MRN: [id number] Date of Birth: 1996-03-24 Referring Provider (PT): Evelina Dun   Encounter Date: 07/18/2019  PT End of Session - 07/18/19 1610    Visit Number  2    Number of Visits  12    Date for PT Re-Evaluation  08/25/19    PT Start Time  0145    PT Stop Time  0240    PT Time Calculation (min)  55 min    Activity Tolerance  Patient tolerated treatment well    Behavior During Therapy  [facility] for tasks assessed/performed       Past Medical History:  Diagnosis Date  . Ankle sprain    right  . Depression   . Fatty liver disease, nonalcoholic   . GERD (gastroesophageal reflux disease)   . Tonsillitis     Past Surgical History:  Procedure Laterality Date  . BIOPSY  05/02/2019   Procedure: BIOPSY;  Surgeon: Daneil Dolin, MD;  [facility];  Service: Endoscopy;;  gastric polyps   . COLONOSCOPY WITH PROPOFOL N/A 05/02/2019   Procedure: COLONOSCOPY WITH PROPOFOL;  Surgeon: Daneil Dolin, MD;  [facility];  Service: Endoscopy;  Laterality: N/A;  1:15pm  . ESOPHAGOGASTRODUODENOSCOPY (EGD) WITH PROPOFOL N/A 05/02/2019   Procedure: ESOPHAGOGASTRODUODENOSCOPY (EGD) WITH PROPOFOL;  Surgeon: Daneil Dolin, MD;  [facility];  Service: Endoscopy;  Laterality: N/A;  . EYE SURGERY    . TONSILLECTOMY AND ADENOIDECTOMY Bilateral 11/16/2018   Procedure: TONSILLECTOMY AND ADENOIDECTOMY;  Surgeon: Leta Baptist, MD;  Location: [place];  Service: ENT;  Laterality: Bilateral;    There were no vitals filed for this visit.  Subjective Assessment - 07/18/19 1601    Subjective  COVID-19 screen performed prior to patient entering clinic.  Pain about a 7 today.    Pertinent History  Ankle injury, eye surgery.    Patient Stated Goals  get out of  pain.    Currently in Pain?  Yes    Pain Score  7     Pain Location  Back    Pain Orientation  Right    Pain Descriptors / Indicators  Sharp    Pain Type  Acute pain    Pain Onset  More than a month ago                       [facility] Adult PT Treatment/Exercise - 07/18/19 0001      Exercises   Exercises  Knee/Hip      Knee/Hip Exercises: Aerobic   Nustep  Level 3 x 15 minutes.      Modalities   Modalities  Electrical Stimulation;Moist Heat      Moist Heat Therapy   Number Minutes Moist Heat  20 Minutes    Moist Heat Location  --   Thoracic spine.     Acupuncturist Location  Right thoracic.    Electrical Stimulation Action  Pre-mod.    Electrical Stimulation Parameters  80-150 Hz x 20 minutes.    Electrical Stimulation Goals  Pain      Manual Therapy   Manual Therapy  Passive ROM    Manual therapy comments  In prone:  STW/M x 8 minutes to patient's affected right thoracic spine.  PT Long Term Goals - 07/07/19 1223      PT LONG TERM GOAL #1   Title  Patient will be independent with HEP    Baseline  No knowledge of appropriate ther ex.    Time  6    Period  Weeks    Status  New      PT LONG TERM GOAL #2   Title  Perform ADL's with pain not > 2-3/10.    Baseline  Pain rise to very high levels with performance of ADL's.    Time  6    Period  Weeks    Status  New      PT LONG TERM GOAL #3   Title  Eliminate pain radiation into right rib region.    Baseline  Pain radiates fronm patient's lower thoracic region to right rib region.    Time  6    Period  Weeks    Status  New            Plan - 07/18/19 1611    Clinical Impression Statement  The patient with continued right thoracic pain.  She did well with STW/M today.    Examination-Activity Limitations  Reach Overhead    Examination-Participation Restrictions  Other    Stability/Clinical Decision Making  Evolving/Moderate  complexity    Rehab Potential  Excellent    PT Frequency  2x / week    PT Duration  6 weeks    PT Treatment/Interventions  ADLs/Self Care Home Management;Cryotherapy;Electrical Stimulation;Ultrasound;Moist Heat;Therapeutic activities;Manual techniques;Patient/family education;Joint Manipulations    PT Next Visit Plan  Thoracic and costovertebral mobs.  HMP, electrical stimulation.  Combo e'stim/U/S and STW/M.  Thoracic strengthening.    Consulted and Agree with Plan of Care  Patient       Patient will benefit from skilled therapeutic intervention in order to improve the following deficits and impairments:  Pain, Decreased activity tolerance, Decreased strength  Visit Diagnosis: Pain in thoracic spine  Abnormal posture     Problem List Patient Active Problem List   Diagnosis Date Noted  . Anemia 06/28/2019  . Pregnancy test negative 05/25/2019  . Missed periods 05/05/2019  . Weight gain 05/05/2019  . Patient desires pregnancy 05/05/2019  . Rectal bleeding 03/17/2019  . Pain of upper abdomen 03/17/2019  . MDD (major depressive disorder), recurrent episode, moderate (HCC) 03/25/2018  . Obesity (BMI 30-39.9) 12/11/2017  . Chest wall pain 09/14/2015  . Gastroesophageal reflux disease 06/18/2015    Dana Lane, [place] MPT 07/18/2019, 4:14 PM  [facility] [address] [place], [place], [address] Phone: [phone]   Fax:  [phone]  Name: Dana Lane MRN: [id number] Date of Birth: 1996/05/02

## 2019-07-21 ENCOUNTER — Telehealth: Payer: Self-pay

## 2019-07-21 DIAGNOSIS — R1310 Dysphagia, unspecified: Secondary | ICD-10-CM

## 2019-07-21 DIAGNOSIS — K219 Gastro-esophageal reflux disease without esophagitis: Secondary | ICD-10-CM

## 2019-07-21 NOTE — Telephone Encounter (Signed)
Pt called with an update of taking Pantoprazole. Pt is taking Pantoprazole 40 mg bid and when drinks liquid, she burps it up. Pt will burp with certain foods like ice cream, pt doesn't burp with most foods. Pt does have pain in her chest that goes to the left side of her abdomen. Pt's pain level can get to an 8 with 10- being the highest. Pt is avoiding, soda, fried food and spicy foods. Pt is elevating her head at night when sleeping, and not laying down for 2-3 hours after her evening meal.

## 2019-07-22 NOTE — Telephone Encounter (Signed)
Please call patient back and ask if this is still persistent. If so we can try Carafate prn. If that doesn't help we can maybe switch her PPI again. We can also look into BPE if the belching up liquids is persistent.

## 2019-07-25 MED ORDER — SUCRALFATE 1 GM/10ML PO SUSP: 1.0000 g | Freq: Four times a day (QID) | ORAL | 1 refills | Status: DC | PRN

## 2019-07-25 NOTE — Telephone Encounter (Signed)
BPE scheduled for 07/27/19 at 11:00am, arrive at 10:45am. NPO 3 hours before test. Tried to call pt, no answer, LMOVM. She has MyChart. Message also sent to pt via MyChart.

## 2019-07-25 NOTE — Telephone Encounter (Signed)
Orders entered for BPE and Carafate (she can pick this up after the BPE if she needs it)

## 2019-07-25 NOTE — Telephone Encounter (Signed)
Lmom, waiting on a return call.  

## 2019-07-25 NOTE — Telephone Encounter (Signed)
Spoke with pt. Pt would like to pursue the BPE first. Pt states that her symptoms are persistent and she will try Carafate after the BPE. I explained to pt that the Carafate could help her if she would like to try it. Pt says she wants to wait to try med after the BPE.

## 2019-07-25 NOTE — Telephone Encounter (Signed)
Noted. Routing message to [facility] clinical.

## 2019-07-25 NOTE — Addendum Note (Signed)
Addended by: Delane Ginger, Rolla Kedzierski A on: 07/25/2019 01:19 PM   Modules accepted: Orders

## 2019-07-27 ENCOUNTER — Other Ambulatory Visit (HOSPITAL_COMMUNITY): Payer: 59

## 2019-07-28 ENCOUNTER — Ambulatory Visit: Payer: 59 | Attending: Family | Admitting: *Deleted

## 2019-07-28 ENCOUNTER — Other Ambulatory Visit: Payer: Self-pay

## 2019-07-28 DIAGNOSIS — R293 Abnormal posture: Secondary | ICD-10-CM | POA: Insufficient documentation

## 2019-07-28 DIAGNOSIS — M546 Pain in thoracic spine: Secondary | ICD-10-CM | POA: Insufficient documentation

## 2019-07-28 NOTE — Therapy (Signed)
[facility] [address] [place], [place], [address] Phone: [phone]   Fax:  [phone]  Physical Therapy Treatment  Patient Details  Name: Dana Lane MRN: [id number] Date of Birth: 1996-01-18 Referring Provider (PT): Jannifer Rodney   Encounter Date: 07/28/2019  PT End of Session - 07/28/19 1706    Visit Number  3    Number of Visits  12    Date for PT Re-Evaluation  08/25/19    PT Start Time  1600    PT Stop Time  1649    PT Time Calculation (min)  49 min       Past Medical History:  Diagnosis Date  . Ankle sprain    right  . Depression   . Fatty liver disease, nonalcoholic   . GERD (gastroesophageal reflux disease)   . Tonsillitis     Past Surgical History:  Procedure Laterality Date  . BIOPSY  05/02/2019   Procedure: BIOPSY;  Surgeon: Corbin Ade, MD;  [facility];  Service: Endoscopy;;  gastric polyps   . COLONOSCOPY WITH PROPOFOL N/A 05/02/2019   Procedure: COLONOSCOPY WITH PROPOFOL;  Surgeon: Corbin Ade, MD;  [facility];  Service: Endoscopy;  Laterality: N/A;  1:15pm  . ESOPHAGOGASTRODUODENOSCOPY (EGD) WITH PROPOFOL N/A 05/02/2019   Procedure: ESOPHAGOGASTRODUODENOSCOPY (EGD) WITH PROPOFOL;  Surgeon: Corbin Ade, MD;  [facility];  Service: Endoscopy;  Laterality: N/A;  . EYE SURGERY    . TONSILLECTOMY AND ADENOIDECTOMY Bilateral 11/16/2018   Procedure: TONSILLECTOMY AND ADENOIDECTOMY;  Surgeon: Newman Pies, MD;  Location: [facility];  Service: ENT;  Laterality: Bilateral;    There were no vitals filed for this visit.  Subjective Assessment - 07/28/19 1644    Subjective  COVID-19 screen performed prior to patient entering clinic.  Pain about a 6- 7 today.Did good after last Rx.    Pertinent History  Ankle injury, eye surgery.    Patient Stated Goals  get out of pain.    Currently in Pain?  Yes    Pain Score  7     Pain Location  Back    Pain  Orientation  Right;Mid    Pain Descriptors / Indicators  Sharp    Pain Type  Acute pain    Pain Onset  More than a month ago                       [facility] Adult PT Treatment/Exercise - 07/28/19 0001      Exercises   Exercises  Knee/Hip      Knee/Hip Exercises: Aerobic   Nustep  Level 3 x 15 minutes.      Modalities   Modalities  Electrical Stimulation;Ultrasound      Moist Heat Therapy   Number Minutes Moist Heat  15 Minutes    Moist Heat Location  --   Thoracic spine.     Programme researcher, broadcasting/film/video Location  Right thoracic.    Electrical Stimulation Action  supine premod    Electrical Stimulation Parameters  80-150hz  x 15 mins    Electrical Stimulation Goals  Pain      Ultrasound   Ultrasound Location  Rt side T10-12 paras    Ultrasound Parameters  sitting 1.5 w/cm2 x 10 mins    Ultrasound Goals  Pain      Manual Therapy   Manual Therapy  --  PT Long Term Goals - 07/07/19 1223      PT LONG TERM GOAL #1   Title  Patient will be independent with HEP    Baseline  No knowledge of appropriate ther ex.    Time  6    Period  Weeks    Status  New      PT LONG TERM GOAL #2   Title  Perform ADL's with pain not > 2-3/10.    Baseline  Pain rise to very high levels with performance of ADL's.    Time  6    Period  Weeks    Status  New      PT LONG TERM GOAL #3   Title  Eliminate pain radiation into right rib region.    Baseline  Pain radiates fronm patient's lower thoracic region to right rib region.    Time  6    Period  Weeks    Status  New            Plan - 07/28/19 1708    Clinical Impression Statement  Pt arrived today doing about the same with pain RT side 7/10. She does report having some relief after last Rx and did well with todays. Pt still sore along RT side thoracic paras with some decrease after today's Rx    Examination-Activity Limitations  Reach Overhead    Examination-Participation  Restrictions  Other    Stability/Clinical Decision Making  Evolving/Moderate complexity    Rehab Potential  Excellent    PT Frequency  2x / week    PT Duration  6 weeks    PT Treatment/Interventions  ADLs/Self Care Home Management;Cryotherapy;Electrical Stimulation;Ultrasound;Moist Heat;Therapeutic activities;Manual techniques;Patient/family education;Joint Manipulations    PT Next Visit Plan  Thoracic and costovertebral mobs.  HMP, electrical stimulation.  Combo e'stim/U/S and STW/M.  Thoracic strengthening.    Consulted and Agree with Plan of Care  Patient       Patient will benefit from skilled therapeutic intervention in order to improve the following deficits and impairments:  Pain, Decreased activity tolerance, Decreased strength  Visit Diagnosis: Pain in thoracic spine  Abnormal posture     Problem List Patient Active Problem List   Diagnosis Date Noted  . Anemia 06/28/2019  . Pregnancy test negative 05/25/2019  . Missed periods 05/05/2019  . Weight gain 05/05/2019  . Patient desires pregnancy 05/05/2019  . Rectal bleeding 03/17/2019  . Pain of upper abdomen 03/17/2019  . MDD (major depressive disorder), recurrent episode, moderate ([place]) 03/25/2018  . Obesity (BMI 30-39.9) 12/11/2017  . Chest wall pain 09/14/2015  . Gastroesophageal reflux disease 06/18/2015    Cotey Rakes,CHRIS, PTA 07/28/2019, 5:18 PM  [facility] [address] [place], [place], [address] Phone: [phone]   Fax:  [phone]  Name: Dana Lane MRN: [id number] Date of Birth: Sep 07, 1995

## 2019-07-29 ENCOUNTER — Ambulatory Visit (HOSPITAL_COMMUNITY)
Admission: RE | Admit: 2019-07-29 | Discharge: 2019-07-29 | Disposition: A | Discharge status: Home / Self Care | Payer: Self-pay | Source: Ambulatory Visit | Attending: Nurse Practitioner | Admitting: Nurse Practitioner

## 2019-07-29 DIAGNOSIS — R1310 Dysphagia, unspecified: Secondary | ICD-10-CM | POA: Insufficient documentation

## 2019-08-05 ENCOUNTER — Encounter: Payer: 59 | Admitting: Physical Therapy

## 2019-08-28 ENCOUNTER — Encounter: Payer: Self-pay | Admitting: Nurse Practitioner

## 2019-10-04 IMAGING — US US ABDOMEN COMPLETE
1 series · 14 of 25 positions shown · non-contrast
Comparison: None.

CLINICAL DATA: Abdominal pain

EXAM:
ABDOMEN ULTRASOUND COMPLETE

[Series 1: us abdomen complete · 14 of 100 slices shown]
[im 1/100]
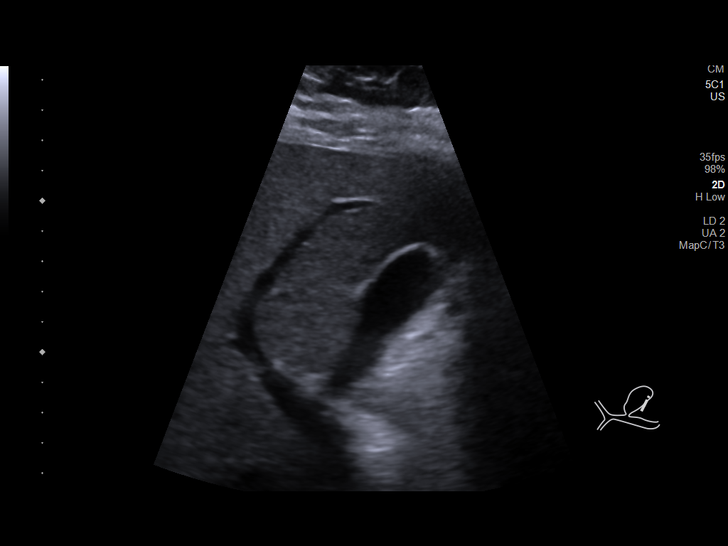
[im 9/100]
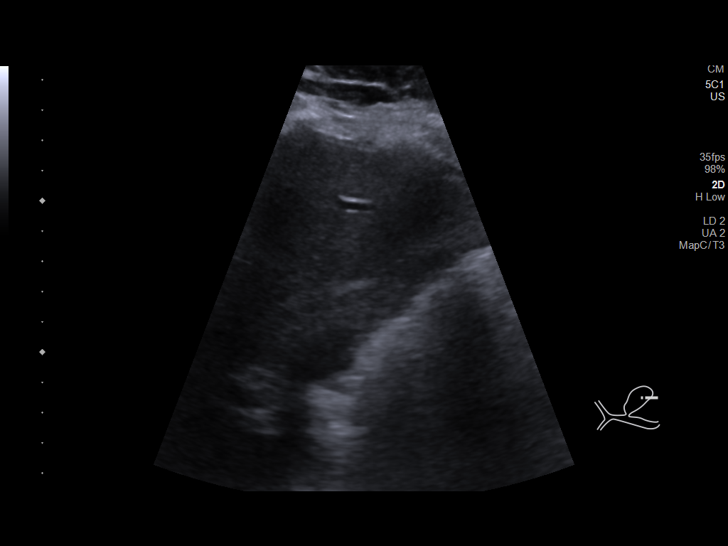
[im 17/100]
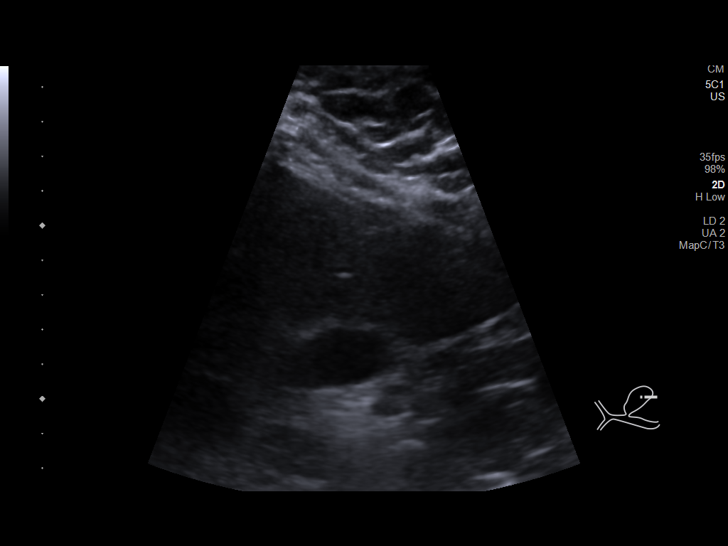
[im 25/100]
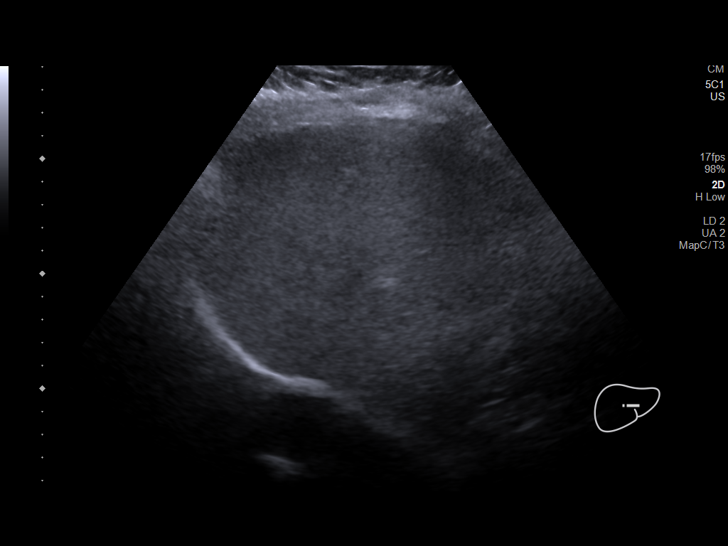
[im 34/100]
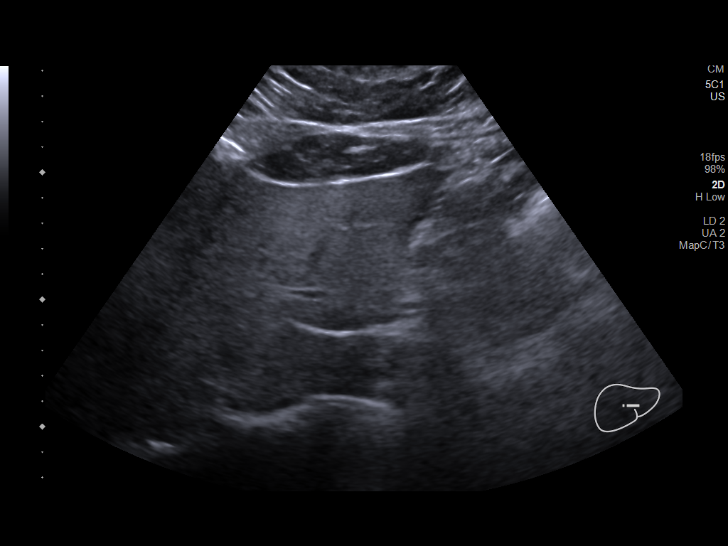
[im 38/100]
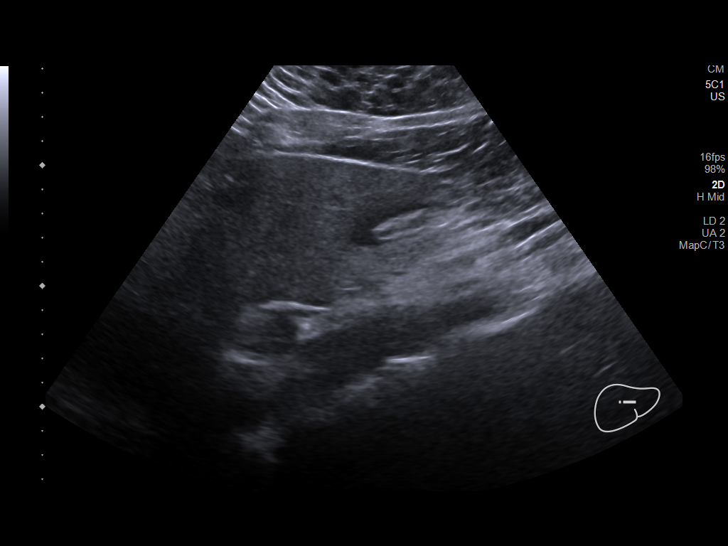
[im 46/100]
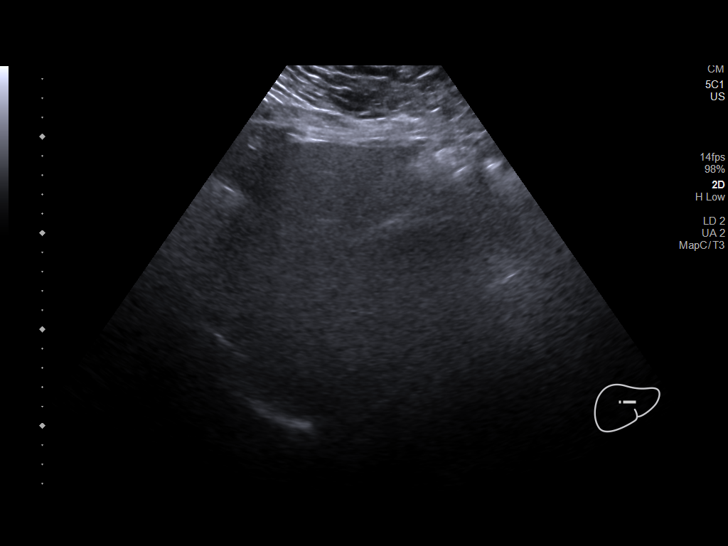
[im 54/100]
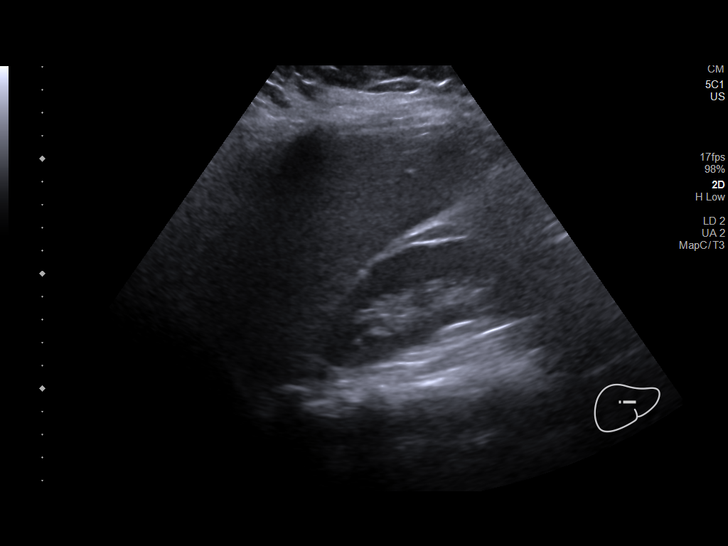
[im 62/100]
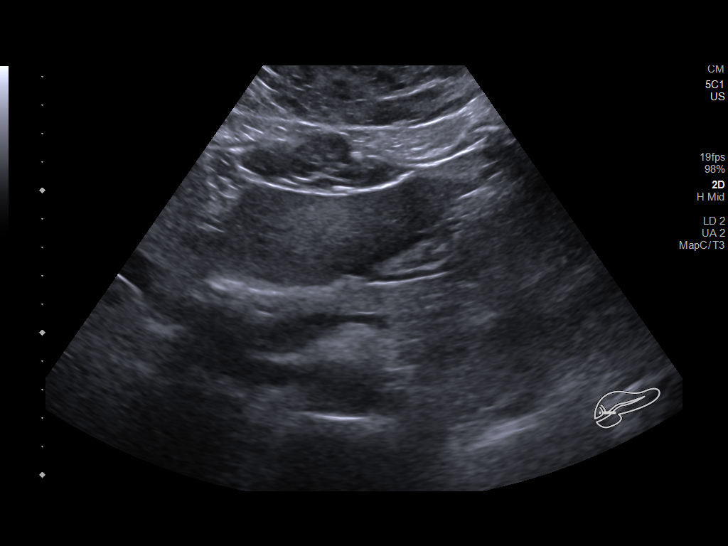
[im 67/100]
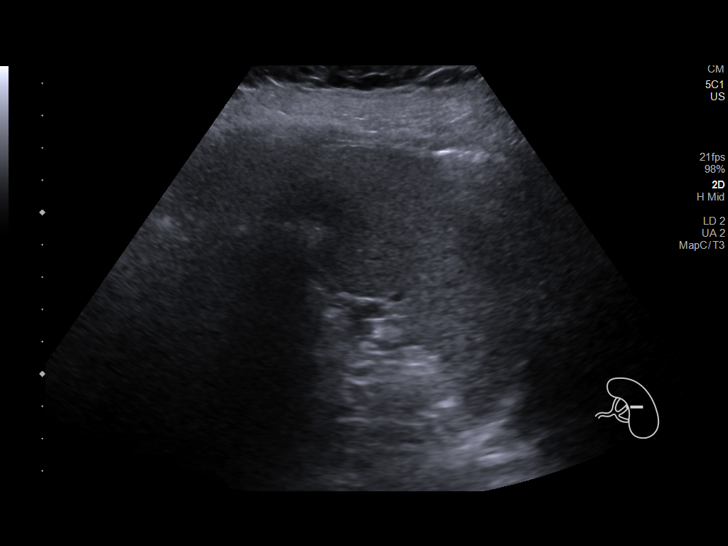
[im 75/100]
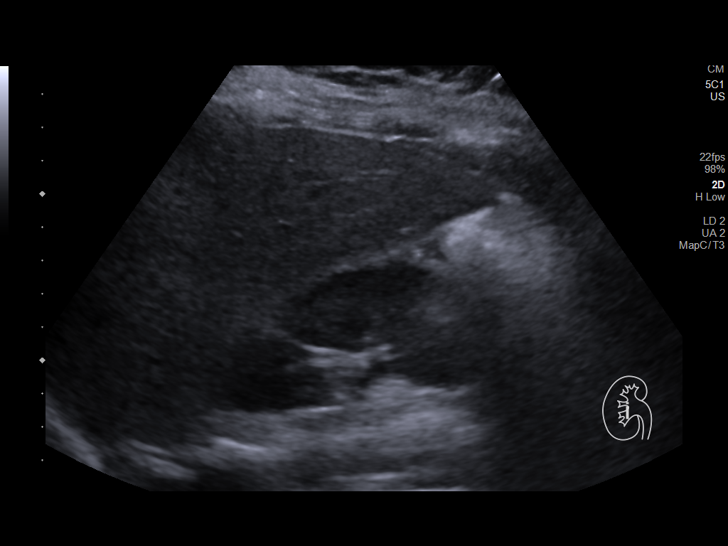
[im 83/100]
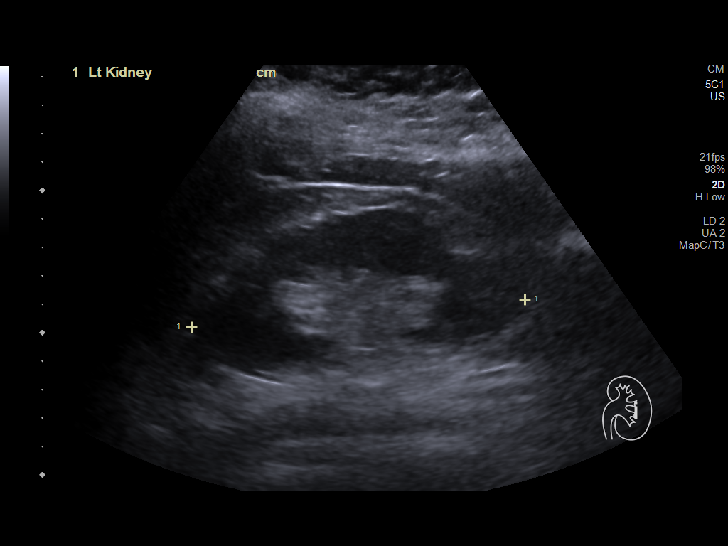
[im 91/100]
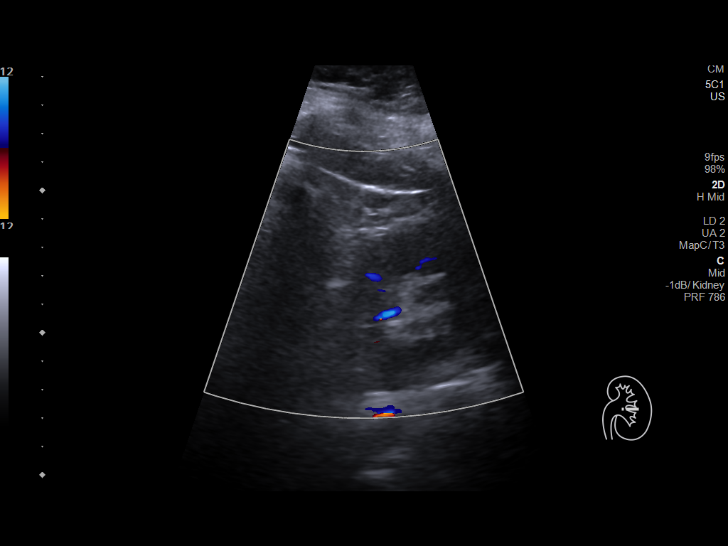
[im 100/100]
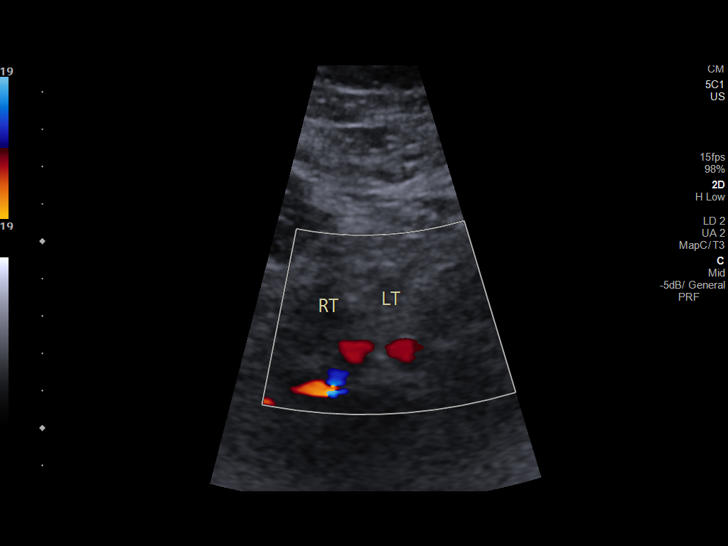

[14 of 25 positions shown; findings below may reference images not displayed]

FINDINGS: Gallbladder: No gallstones or wall thickening visualized. There is
no pericholecystic fluid. No sonographic Murphy sign noted by
sonographer.

Common bile duct: Diameter: 4 mm. No intrahepatic, common hepatic,
or common bile duct dilatation.

Liver: No focal lesion identified. Liver echogenicity overall is
increased. Portal vein is patent on color Doppler imaging with
normal direction of blood flow towards the liver.

IVC: No abnormality visualized.

Pancreas: No pancreatic mass or inflammatory focus.

Spleen: Size and appearance within normal limits.

Right Kidney: Length: 10.5 cm. Echogenicity within normal limits. No
mass or hydronephrosis visualized.

Left Kidney: Length: 11.8 cm. Echogenicity within normal limits. No
mass or hydronephrosis visualized.

Abdominal aorta: No aneurysm visualized.

Other findings: No demonstrable ascites.
IMPRESSION: 1. Increase in liver echogenicity, a finding indicative of hepatic
steatosis. No focal liver lesions evident.

2.  Study otherwise unremarkable.

## 2019-11-17 ENCOUNTER — Telehealth: Payer: Self-pay | Admitting: Family

## 2019-11-17 NOTE — Telephone Encounter (Signed)
Lmovm regarding disability paperwork received from patient.  We will need more information before this paperwork can be filled out.  Last office visit was 06/30/19.

## 2019-11-23 ENCOUNTER — Ambulatory Visit: Payer: Self-pay | Admitting: Physician Assistant

## 2019-12-02 NOTE — Telephone Encounter (Signed)
Patient called and stated she was out of work for two different issues.   First was her sprained ankle (sometime before July 2020) Second was for her inability to wear a mask due to anxiety/panic issues (July 2020)  Work is requesting patient to have STD form filled out to cover this for her work.

## 2019-12-27 ENCOUNTER — Ambulatory Visit: Payer: Managed Care, Other (non HMO) | Admitting: Nurse Practitioner

## 2019-12-27 ENCOUNTER — Encounter: Payer: Self-pay | Admitting: Internal Medicine

## 2019-12-27 NOTE — Progress Notes (Deleted)
Referring Provider: Junie Spencer, FNP Primary Care Physician:  Junie Spencer, FNP Primary GI:  Dr. Jena Gauss  No chief complaint on file.   HPI:   Dana Lane is a 24 y.o. female who presents for follow-up on GERD and rectal bleeding.  The patient was last seen in our office 06/28/2019 for the same as well as anemia.  Noted chronic history of GERD previously well-controlled on omeprazole 40 mg for several years.  In January 2020 hemoglobin declined from 13.1-11.3 in the setting of some noted bright red blood per rectum intermittently.  Updated labs 03/23/2019 with stable but slight decline in hemoglobin to 11.1 that was microcytic and hypochromic with low iron at 31, low sat at 8%, low ferritin at 9.  Recommended endoscopic evaluation.  Colonoscopy completed 05/02/2019 with entire colon normal, suspected trivial rectal bleeding.  EGD found normal esophagus, multiple gastric polyps status post resection and biopsy, abnormal gastric mucosa of doubtful clinical significance status post biopsy, normal duodenum.  Surgical pathology found the polyps to be chronic inactive gastritis, additional stomach polyp found to be fundic gland polyp.  Follow-up CBC 05/23/2019 found hemoglobin normalized to 13.1.  At her last visit she noted GERD well managed on PPI with rare breakthrough.  Some persistent left upper quadrant and epigastric discomfort without nausea or vomiting.  No further hematochezia.  Irregular bowel movements about once a week consistently [place] 4 with complete emptying.  Eats minimal fiber but drinks adequate water.  Denies any other overt GI complaints.  Recommended change omeprazole to Protonix 40 mg twice daily, avoid triggers, additional GERD education.  Follow-up in 6 months.  She called our office 07/21/2019 noting burping up the liquid, no issues with most foods.  Persistent chest/left abdominal pain.  Is avoiding triggers and elevating head of bed at night.  Offered barium pill  esophagram to evaluate.  BPE completed 07/29/2019 which found small sliding hiatal hernia, otherwise negative.  Recommended Carafate for persistent symptoms.  Today she states   Today she states   Past Medical History:  Diagnosis Date  . Ankle sprain    right  . Depression   . Fatty liver disease, nonalcoholic   . GERD (gastroesophageal reflux disease)   . Tonsillitis     Past Surgical History:  Procedure Laterality Date  . BIOPSY  05/02/2019   Procedure: BIOPSY;  Surgeon: Corbin Ade, MD;  Location: [facility];  Service: Endoscopy;;  gastric polyps   . COLONOSCOPY WITH PROPOFOL N/A 05/02/2019   Procedure: COLONOSCOPY WITH PROPOFOL;  Surgeon: Corbin Ade, MD;  Location: [facility];  Service: Endoscopy;  Laterality: N/A;  1:15pm  . ESOPHAGOGASTRODUODENOSCOPY (EGD) WITH PROPOFOL N/A 05/02/2019   Procedure: ESOPHAGOGASTRODUODENOSCOPY (EGD) WITH PROPOFOL;  Surgeon: Corbin Ade, MD;  Location: [facility];  Service: Endoscopy;  Laterality: N/A;  . EYE SURGERY    . TONSILLECTOMY AND ADENOIDECTOMY Bilateral 11/16/2018   Procedure: TONSILLECTOMY AND ADENOIDECTOMY;  Surgeon: Newman Pies, MD;  Location: [facility];  Service: ENT;  Laterality: Bilateral;    Current Outpatient Medications  Medication Sig Dispense Refill  . baclofen (LIORESAL) 10 MG tablet Take 1 tablet (10 mg total) by mouth 3 (three) times daily. 30 each 0  . ciprofloxacin (CIPRO) 500 MG tablet Take 1 tablet (500 mg total) by mouth 2 (two) times daily. (Patient not taking: Reported on 07/07/2019) 14 tablet 0  . Ferrous Sulfate (IRON PO) Take 18 mg by mouth daily. Gummiest (2 gummies)    .  fluconazole (DIFLUCAN) 150 MG tablet Take 1 tablet (150 mg total) by mouth every three (3) days as needed. (Patient not taking: Reported on 07/07/2019) 3 tablet 0  . fluticasone (FLONASE) 50 MCG/ACT nasal spray Place into both nostrils as needed for allergies or rhinitis.    . medroxyPROGESTERone (PROVERA) 10 MG  tablet Take 1 tablet (10 mg total) by mouth daily. (Patient not taking: Reported on 07/07/2019) 10 tablet 0  . pantoprazole (PROTONIX) 40 MG tablet Take 1 tablet (40 mg total) by mouth 2 (two) times daily before a meal. 60 tablet 5  . Prenatal MV-Min-Fe Fum-FA-DHA (PRENATAL 1 PO) Take by mouth daily.    . sucralfate (CARAFATE) 1 GM/10ML suspension Take 10 mLs (1 g total) by mouth 4 (four) times daily as needed (worsening GERD symptoms). 420 mL 1  . venlafaxine XR (EFFEXOR-XR) 75 MG 24 hr capsule TAKE 1 CAPSULE BY MOUTH  DAILY WITH BREAKFAST (Patient taking differently: Take 75 mg by mouth daily with breakfast. ) 90 capsule 1   No current facility-administered medications for this visit.    Allergies as of 12/27/2019 - Review Complete 06/30/2019  Allergen Reaction Noted  . Poison oak extract [poison oak extract] Itching and Rash 01/09/2016  . Chocolate Itching and Swelling 12/07/2014  . Eggs or egg-derived products Diarrhea and Other (See Comments) 12/07/2014    Family History  Problem Relation Age of Onset  . Diabetes Mother   . Cervical cancer Mother        ? patient isn't sure. Thinks mom had this when she was younger (<50 y.o.)  . Cancer Father        prostate  . Depression Sister   . Cancer Sister        bladder  . Heart disease Paternal Grandfather   . Colon polyps Neg Hx   . Colon cancer Neg Hx     Social History   Socioeconomic History  . Marital status: Single    Spouse name: Not on file  . Number of children: Not on file  . Years of education: 23  . Highest education level: Not on file  Occupational History  . Not on file  Tobacco Use  . Smoking status: Never Smoker  . Smokeless tobacco: Never Used  Vaping Use  . Vaping Use: Never used  Substance and Sexual Activity  . Alcohol use: Not Currently  . Drug use: No  . Sexual activity: Yes    Birth control/protection: Condom  Other Topics Concern  . Not on file  Social History Narrative  . Not on file    Social Determinants of Health   Financial Resource Strain:   . Difficulty of Paying Living Expenses:   Food Insecurity:   . Worried About Programme researcher, broadcasting/film/video in the Last Year:   . Barista in the Last Year:   Transportation Needs:   . Freight forwarder (Medical):   Marland Kitchen Lack of Transportation (Non-Medical):   Physical Activity:   . Days of Exercise per Week:   . Minutes of Exercise per Session:   Stress:   . Feeling of Stress :   Social Connections:   . Frequency of Communication with Friends and Family:   . Frequency of Social Gatherings with Friends and Family:   . Attends Religious Services:   . Active Member of Clubs or Organizations:   . Attends Banker Meetings:   Marland Kitchen Marital Status:     Subjective: Review of Systems  Constitutional: Negative for chills, fever, malaise/fatigue and weight loss.  HENT: Negative for congestion and sore throat.   Respiratory: Negative for cough and shortness of breath.   Cardiovascular: Negative for chest pain and palpitations.  Gastrointestinal: Negative for abdominal pain, blood in stool, diarrhea, melena, nausea and vomiting.  Musculoskeletal: Negative for joint pain and myalgias.  Skin: Negative for rash.  Neurological: Negative for dizziness and weakness.  Endo/Heme/Allergies: Does not bruise/bleed easily.  Psychiatric/Behavioral: Negative for depression. The patient is not nervous/anxious.   All other systems reviewed and are negative.    Objective: There were no vitals taken for this visit. Physical Exam Vitals and nursing note reviewed.  Constitutional:      General: She is not in acute distress.    Appearance: Normal appearance. She is well-developed. She is not ill-appearing, toxic-appearing or diaphoretic.  HENT:     Head: Normocephalic and atraumatic.     Nose: No congestion or rhinorrhea.  Eyes:     General: No scleral icterus. Cardiovascular:     Rate and Rhythm: Normal rate and regular  rhythm.     Heart sounds: Normal heart sounds.  Pulmonary:     Effort: Pulmonary effort is normal. No respiratory distress.     Breath sounds: Normal breath sounds.  Abdominal:     General: Bowel sounds are normal.     Palpations: Abdomen is soft. There is no hepatomegaly, splenomegaly or mass.     Tenderness: There is no abdominal tenderness. There is no guarding or rebound.     Hernia: No hernia is present.  Skin:    General: Skin is warm and dry.     Coloration: Skin is not jaundiced.     Findings: No rash.  Neurological:     General: No focal deficit present.     Mental Status: She is alert and oriented to person, place, and time.  Psychiatric:        Attention and Perception: Attention normal.        Mood and Affect: Mood normal.        Speech: Speech normal.        Behavior: Behavior normal.        Thought Content: Thought content normal.        Cognition and Memory: Cognition and memory normal.       12/27/2019 8:11 AM   Disclaimer: This note was dictated with voice recognition software. Similar sounding words can inadvertently be transcribed and may not be corrected upon review.

## 2020-01-30 ENCOUNTER — Encounter: Payer: Self-pay | Admitting: Physician Assistant

## 2020-01-30 ENCOUNTER — Other Ambulatory Visit: Payer: Self-pay

## 2020-01-30 ENCOUNTER — Ambulatory Visit: Payer: Medicaid Other | Admitting: Physician Assistant

## 2020-01-30 VITALS — BP 120/90 | HR 86 | Temp 98.8°F | Ht 64.0 in | Wt 209.0 lb

## 2020-01-30 DIAGNOSIS — E669 Obesity, unspecified: Secondary | ICD-10-CM

## 2020-01-30 DIAGNOSIS — Z7689 Persons encountering health services in other specified circumstances: Secondary | ICD-10-CM

## 2020-01-30 DIAGNOSIS — K219 Gastro-esophageal reflux disease without esophagitis: Secondary | ICD-10-CM

## 2020-01-30 MED ORDER — OMEPRAZOLE 40 MG PO CPDR: 40.0000 mg | DELAYED_RELEASE_CAPSULE | Freq: Every day | ORAL | 3 refills | Status: DC

## 2020-01-30 NOTE — Progress Notes (Signed)
BP 120/90   Pulse 86   Temp 98.8 F (37.1 C)   Ht 5\' 4"  (1.626 m)   Wt 209 lb (94.8 kg)   SpO2 99%   BMI 35.87 kg/m    Subjective:    Patient ID: , female    DOB: 1996-04-13, 24 y.o.   MRN: [id number]  HPI: Dana Lane is a 24 y.o. female presenting on 01/30/2020 for New Patient (Initial Visit)   HPI    Pt had a negative covid 19 screening questionnaire.     Pt is a 24yoF who is a returning pt who presents to office today to re-establish care.  She was last seen here 10/21/2016,  Pt was going to [facility] since being seen here.  She had insurance for some time but no longer has it.  She is not working.  She worked for 2 years but says she quit because she wasn't getting paid.  She says her GERD is bothering her due to not taking any meds for it.  This is her only complaint today.  Pt says she hasn't had a period since 2020.   She saw gyn in December for this but never had follow up.  She has not had the covid vaccination       Relevant past medical, surgical, family and social history reviewed and updated as indicated. Interim medical history since our last visit reviewed. Allergies and medications reviewed and updated.  No current outpatient medications on file.   Review of Systems  Per HPI unless specifically indicated above     Objective:    BP 120/90   Pulse 86   Temp 98.8 F (37.1 C)   Ht 5\' 4"  (1.626 m)   Wt 209 lb (94.8 kg)   SpO2 99%   BMI 35.87 kg/m   Wt Readings from Last 3 Encounters:  01/30/20 209 lb (94.8 kg)  06/30/19 232 lb (105.2 kg)  06/28/19 227 lb 3.2 oz (103.1 kg)    Physical Exam Vitals reviewed.  Constitutional:      General: She is not in acute distress.    Appearance: She is well-developed. She is obese. She is not toxic-appearing.  HENT:     Head: Normocephalic and atraumatic.  Eyes:     Conjunctiva/sclera: Conjunctivae normal.     Pupils: Pupils are equal, round, and reactive to light.  Neck:     Thyroid: No  thyromegaly.  Cardiovascular:     Rate and Rhythm: Normal rate and regular rhythm.  Pulmonary:     Effort: Pulmonary effort is normal.     Breath sounds: Normal breath sounds.  Abdominal:     General: Bowel sounds are normal.     Palpations: Abdomen is soft. There is no mass.     Tenderness: There is no abdominal tenderness.  Musculoskeletal:     Cervical back: Neck supple.     Right lower leg: No edema.     Left lower leg: No edema.  Lymphadenopathy:     Cervical: No cervical adenopathy.  Skin:    General: Skin is warm and dry.  Neurological:     Mental Status: She is alert and oriented to person, place, and time.     Gait: Gait normal.  Psychiatric:        Behavior: Behavior normal.            Assessment & Plan:    Encounter Diagnoses  Name Primary?  . Encounter to establish care Yes  .  Gastroesophageal reflux disease, unspecified whether esophagitis present   . Obesity, unspecified classification, unspecified obesity type, unspecified whether serious comorbidity present      -reviewed labs and No additional labs needed at this time -pt Needs to f/u with gyn for amenorrhea.  She has [place] medicaid so should be able to see gyn.  She says she has the number and she is encouraged to call them for follow-up appointment -Encouraged pt to get a new job to help her weight, mood, and finances -pt encouraged pt to Get covid vaccination -pt will be restarted on Omeprazole for GERD.  Discussed dietary and lifestyle changes to also help the GERD -encouraged regular Exercise to help overall health, weight management and mood -pt to follow up in 6 months.  She is to contact office sooner if needed.

## 2020-02-09 ENCOUNTER — Other Ambulatory Visit: Payer: Self-pay | Admitting: Physician Assistant

## 2020-02-09 MED ORDER — OMEPRAZOLE 40 MG PO CPDR: 40.0000 mg | DELAYED_RELEASE_CAPSULE | Freq: Every day | ORAL | 1 refills | Status: DC

## 2020-02-29 ENCOUNTER — Encounter: Payer: Self-pay | Admitting: Nurse Practitioner

## 2020-02-29 DIAGNOSIS — K625 Hemorrhage of anus and rectum: Secondary | ICD-10-CM

## 2020-02-29 NOTE — Telephone Encounter (Signed)
Received the below message from the patient through MyChart. Please call to schedule. I will reply to her message as well.  "Hi I need to reschedule my appointment with out having to call that I missed and also I still been having stomach pain and I have started back having a problem of haveing blood in the toilet when I have a value movement."

## 2020-03-01 NOTE — Telephone Encounter (Signed)
Please tell the patient I have put in for a CBC. Have her complete this as soon as she can. We will call with results. If bleeding gets worse or if she has any symptoms such as worsening weakness, fatigue, shortness of breath, chest pain, dizziness, passing out, nearly passing out then go to the ER.    Brief clinical note: Moderate amount of rectal bleeding x 4 episodes. Recent colonoscopy 04/2019 reassuring and deemed likely trivial rectal bleeding. Previous anemia. Likely benign anorectal source, will check for worsening hgb. If persistent can try topical therapy such as Anusol

## 2020-03-01 NOTE — Telephone Encounter (Signed)
Spoke with pt and she is aware of Wynne Dust, PA's recommendation and will have labs drawn.

## 2020-03-02 ENCOUNTER — Other Ambulatory Visit (HOSPITAL_COMMUNITY)
Admission: RE | Admit: 2020-03-02 | Discharge: 2020-03-02 | Disposition: A | Discharge status: Home / Self Care | Payer: Medicaid Other | Source: Ambulatory Visit | Attending: Nurse Practitioner | Admitting: Nurse Practitioner

## 2020-03-02 ENCOUNTER — Other Ambulatory Visit: Payer: Self-pay

## 2020-03-02 DIAGNOSIS — K625 Hemorrhage of anus and rectum: Secondary | ICD-10-CM | POA: Insufficient documentation

## 2020-03-02 LAB — CBC WITH DIFFERENTIAL/PLATELET
Abs Immature Granulocytes: 0.02 10*3/uL (ref 0.00–0.07)
Basophils Absolute: 0.1 10*3/uL (ref 0.0–0.1)
Basophils Relative: 1 %
Eosinophils Absolute: 0.1 10*3/uL (ref 0.0–0.5)
Eosinophils Relative: 2 %
HCT: 42.7 % (ref 36.0–46.0)
Hemoglobin: 13.8 g/dL (ref 12.0–15.0)
Immature Granulocytes: 0 %
Lymphocytes Relative: 35 %
Lymphs Abs: 2.6 10*3/uL (ref 0.7–4.0)
MCH: 30 pg (ref 26.0–34.0)
MCHC: 32.3 g/dL (ref 30.0–36.0)
MCV: 92.8 fL (ref 80.0–100.0)
Monocytes Absolute: 0.4 10*3/uL (ref 0.1–1.0)
Monocytes Relative: 6 %
Neutro Abs: 4.2 10*3/uL (ref 1.7–7.7)
Neutrophils Relative %: 56 %
Platelets: 371 10*3/uL (ref 150–400)
RBC: 4.6 MIL/uL (ref 3.87–5.11)
RDW: 13.6 % (ref 11.5–15.5)
WBC: 7.3 10*3/uL (ref 4.0–10.5)
nRBC: 0 % (ref 0.0–0.2)

## 2020-03-08 ENCOUNTER — Encounter: Payer: Self-pay | Admitting: Adult Health

## 2020-03-08 ENCOUNTER — Other Ambulatory Visit: Payer: Self-pay

## 2020-03-08 ENCOUNTER — Ambulatory Visit (INDEPENDENT_AMBULATORY_CARE_PROVIDER_SITE_OTHER): Payer: Self-pay | Admitting: Adult Health

## 2020-03-08 VITALS — BP 124/81 | HR 86 | Ht 64.5 in | Wt 206.6 lb

## 2020-03-08 DIAGNOSIS — Z319 Encounter for procreative management, unspecified: Secondary | ICD-10-CM

## 2020-03-08 DIAGNOSIS — N926 Irregular menstruation, unspecified: Secondary | ICD-10-CM

## 2020-03-08 DIAGNOSIS — Z3202 Encounter for pregnancy test, result negative: Secondary | ICD-10-CM

## 2020-03-08 LAB — POCT URINE PREGNANCY: Preg Test, Ur: NEGATIVE

## 2020-03-08 MED ORDER — NORETHIN ACE-ETH ESTRAD-FE 1-20 MG-MCG PO TABS: 1.0000 | ORAL_TABLET | Freq: Every day | ORAL | 3 refills | Status: DC

## 2020-03-08 NOTE — Progress Notes (Signed)
  Subjective:     Patient ID: Dana Lane, female   DOB: February 08, 1996, 24 y.o.   MRN: [id number]  HPI Dana Lane is a 24 year old white female,single, G0P0 in complaining of irregular periods, had one in July and August but before that July 20202. [facility]   Review of Systems Irregular periods Would like to get pregnant Reviewed past medical,surgical, social and family history. Reviewed medications and allergies.     Objective:   Physical Exam BP 124/81 (BP Location: Left Arm, Patient Position: Sitting, Cuff Size: Normal)   Pulse 86   Ht 5' 4.5" (1.638 m)   Wt 206 lb 9.6 oz (93.7 kg)   LMP 02/18/2020   BMI 34.92 kg/m UPT is negative.Skin warm and dry.Pelvic: external genitalia is normal in appearance no lesions, vagina: pink with good rugae and moisture,urethra has no lesions or masses noted, cervix:smooth and bulbous, uterus: normal size, shape and contour, non tender, no masses felt, adnexa: no masses or tenderness noted. Bladder is non tender and no masses felt.  Fall risk is low  Upstream - 03/08/20 0934      Pregnancy Intention Screening   Does the patient want to become pregnant in the next year? Yes    Does the patient's partner want to become pregnant in the next year? Yes    Would the patient like to discuss contraceptive options today? Yes      Contraception Wrap Up   Current Method No Contraceptive Precautions    End Method Oral Contraceptive    Contraception Counseling Provided Yes         Examination chaperoned by Nance Pear LPN.    Assessment:     1. Urine pregnancy test negative  2. Irregular periods Will cycle with OCs for 3 months  3. Patient desires pregnancy    Plan:     Follow up in 3 months to assess periods and stop OCs and see if cycles regular, she may want to try clomid

## 2020-03-21 ENCOUNTER — Ambulatory Visit (HOSPITAL_COMMUNITY)
Admission: RE | Admit: 2020-03-21 | Discharge: 2020-03-21 | Disposition: A | Discharge status: Home / Self Care | Payer: Medicaid Other | Source: Ambulatory Visit | Attending: Nurse Practitioner | Admitting: Nurse Practitioner

## 2020-03-21 ENCOUNTER — Ambulatory Visit (INDEPENDENT_AMBULATORY_CARE_PROVIDER_SITE_OTHER): Payer: Self-pay | Admitting: Nurse Practitioner

## 2020-03-21 ENCOUNTER — Encounter: Payer: Self-pay | Admitting: Nurse Practitioner

## 2020-03-21 ENCOUNTER — Other Ambulatory Visit: Payer: Self-pay

## 2020-03-21 VITALS — BP 127/85 | HR 74 | Temp 97.1°F | Ht 65.0 in | Wt 210.2 lb

## 2020-03-21 DIAGNOSIS — R109 Unspecified abdominal pain: Secondary | ICD-10-CM

## 2020-03-21 DIAGNOSIS — K219 Gastro-esophageal reflux disease without esophagitis: Secondary | ICD-10-CM

## 2020-03-21 DIAGNOSIS — R1032 Left lower quadrant pain: Secondary | ICD-10-CM

## 2020-03-21 DIAGNOSIS — K5900 Constipation, unspecified: Secondary | ICD-10-CM

## 2020-03-21 HISTORY — DX: Constipation, unspecified: K59.00

## 2020-03-21 HISTORY — DX: Unspecified abdominal pain: R10.9

## 2020-03-21 NOTE — Patient Instructions (Signed)
Your health issues we discussed today were:   GERD (reflux/heartburn): 1. Continue taking omeprazole 40 mg once a day 2. Let us know if you have any worsening or severe symptoms  Constipation: 1. Even though you are not having hard stools or straining, you are likely somewhat constipated 2. Have your x-ray completed when you are able to at [facility].  You do not need an appointment 3. Continue to work on Location manager intake.  You should aim for 5 to 8 glasses of water a day 4. Try to increase the amount of dietary fiber you consume (fruits, vegetables, whole grains) 5. After your x-ray, start taking a fiber supplement once a day for 2 weeks.  After this you can increase to twice a day 6. Let us know if you have any bloating, cramping that is intolerable with the fiber supplement and we can make other recommendations 7. Let us know if any worsening or severe symptoms  Overall I recommend:  1. Continue other current medications 2. Return for follow-up in 3 months 3. Call if you have any questions or concerns   ---------------------------------------------------------------  I am glad you have gotten your COVID-19 vaccination!  Even though you are fully vaccinated you should continue to follow CDC and state/local guidelines.  ---------------------------------------------------------------   At [facility] we value your feedback. You may receive a survey about your visit today. Please share your experience as we strive to create trusting relationships with our patients to provide genuine, compassionate, quality care.  We appreciate your understanding and patience as we review any laboratory studies, imaging, and other diagnostic tests that are ordered as we care for you. Our office policy is 5 business days for review of these results, and any emergent or urgent results are addressed in a timely manner for your best interest. If you do not hear from our  office in 1 week, please contact us.   We also encourage the use of MyChart, which contains your medical information for your review as well. If you are not enrolled in this feature, an access code is on this after visit summary for your convenience. Thank you for allowing [place] to be involved in your care.  It was great to see you today!  I hope you have a great Fall!!

## 2020-03-21 NOTE — Progress Notes (Signed)
Cc'ed to pcp °

## 2020-03-21 NOTE — Progress Notes (Signed)
Referring Provider: Junie Spencer, FNP Primary Care Physician:  Jacquelin Hawking, PA-C Primary GI:  Dr. Jena Gauss  Chief Complaint  Patient presents with  . Gastroesophageal Reflux    doing fine  . Rectal Bleeding    none  . Abdominal Pain    LUQ, constant for a while    HPI:   Dana Lane is a 24 y.o. female who presents for follow-up on GERD and rectal bleeding.  The patient was last seen in our office 06/28/2019 for the same as well as anemia.  Chronic history of GERD well-controlled on omeprazole 40 mg for several years and recently worsening.  History of mild intermittent anemia and most recent labs 03/23/2019 prior to her visit with normal lipase, slight drift in hemoglobin to 11.1 it was microcytic and hypochromic, low iron at 31, low saturation at 8%, low ferritin at 9.  Recommended endoscopic evaluation.  Colonoscopy up-to-date 05/02/2019 with entire colon normal and suspected trivial rectal bleeding.  EGD with normal esophagus, multiple gastric polyps status post resection and biopsy, abnormal gastric mucosa of doubtful clinical significance status post biopsy, normal duodenum.  Surgical pathology found the biopsies to be chronic inactive gastritis and the polyps to be fundic gland polyp.  Hemoglobin normalized to 13.1 on 05/23/2019.  At her last visit GERD doing well on PPI, no further rectal bleeding.  On omeprazole twice daily with rare breakthrough.  Some left upper quadrant and epigastric discomfort but no nausea or vomiting.  Irregular bowel movements but consistent with [place] 4 and completely empties.  Drinks at most 5 glasses of water but often less and is trying to increase this.  Eats minimal fiber.  Recommended switch omeprazole to Protonix 40 mg twice daily, other lifestyle changes discussed, follow-up in 6 months.  She called our office in January indicating some regurgitation of liquids.  Based on patient preference recommended BPE and Carafate.  This was completed  07/29/2019 found small sliding hiatal hernia otherwise negative exam.  The patient was a no-show to her visit in July and messaged our office 02/29/2020 with a few episodes of rectal bleeding.  Again, colonoscopy indicated likely trivial rectal bleeding.  Recommended a CBC.  If persistent bleeding, with consider topical therapy such as Anusol.  Labs are completed 03/02/2020 showed completely normal/slightly improved hemoglobin at 13.8.  Today she states she is doing okay overall. No further bleeding for 3 weeks. GERD symptoms doing well on PPI (omeprazole 40 mg daily). Occasional has breakthrough  That she treats with antacid chews which does help sometime. Still with LLQ/side abdominal pain which has been ongoing, constant. Has a bowel movement as infrequently as once a week or less. No hard stools/straining. Typically consistent with bristol 4. Recently with nausea when she eats (for the past couple weeks). Doesn't have anything for nausea. Denies vomiting, melena, fever, chills, unintentional weight loss. Denies URI or flu-like symptoms. Denies loss of sense of taste or smell. The patient has not received COVID-19 vaccination(s). They are interested in vaccine scheduling information. Denies chest pain, dyspnea, dizziness, lightheadedness, syncope, near syncope. Denies any other upper or lower GI symptoms.  Trying to increase water intake (rather than sodas). Doesn't eat enough dietary fiber.  Past Medical History:  Diagnosis Date  . Anemia   . Ankle sprain    right  . Depression   . Fatty liver disease, nonalcoholic   . GERD (gastroesophageal reflux disease)   . Pre-diabetes   . Tonsillitis     Past Surgical  History:  Procedure Laterality Date  . BIOPSY  05/02/2019   Procedure: BIOPSY;  Surgeon: Corbin Ade, MD;  Location: [facility];  Service: Endoscopy;;  gastric polyps   . COLONOSCOPY WITH PROPOFOL N/A 05/02/2019   Procedure: COLONOSCOPY WITH PROPOFOL;  Surgeon: Corbin Ade, MD;   Location: [facility];  Service: Endoscopy;  Laterality: N/A;  1:15pm  . ESOPHAGOGASTRODUODENOSCOPY (EGD) WITH PROPOFOL N/A 05/02/2019   Procedure: ESOPHAGOGASTRODUODENOSCOPY (EGD) WITH PROPOFOL;  Surgeon: Corbin Ade, MD;  Location: [facility];  Service: Endoscopy;  Laterality: N/A;  . EYE SURGERY  24yo  . TONSILLECTOMY AND ADENOIDECTOMY Bilateral 11/16/2018   Procedure: TONSILLECTOMY AND ADENOIDECTOMY;  Surgeon: Newman Pies, MD;  Location: [facility];  Service: ENT;  Laterality: Bilateral;    Current Outpatient Medications  Medication Sig Dispense Refill  . norethindrone-ethinyl estradiol (LOESTRIN FE) 1-20 MG-MCG tablet Take 1 tablet by mouth daily. 28 tablet 3  . omeprazole (PRILOSEC) 40 MG capsule Take 1 capsule (40 mg total) by mouth daily. 90 capsule 1  . Prenatal Vit-Fe Fumarate-FA (MULTIVITAMIN-PRENATAL) 27-0.8 MG TABS tablet Take 1 tablet by mouth daily at 12 noon.     No current facility-administered medications for this visit.    Allergies as of 03/21/2020 - Review Complete 03/21/2020  Allergen Reaction Noted  . Poison oak extract [poison oak extract] Itching and Rash 01/09/2016  . Chocolate Itching and Swelling 12/07/2014  . Eggs or egg-derived products Diarrhea and Other (See Comments) 12/07/2014    Family History  Problem Relation Age of Onset  . Diabetes Mother   . Cervical cancer Mother        ? patient isn't sure. Thinks mom had this when she was younger (<50 y.o.)  . Cancer Father        prostate  . Depression Sister   . Cancer Sister        bladder  . Heart disease Paternal Grandfather   . Colon polyps Neg Hx   . Colon cancer Neg Hx     Social History   Socioeconomic History  . Marital status: Single    Spouse name: Not on file  . Number of children: Not on file  . Years of education: 26  . Highest education level: Not on file  Occupational History  . Not on file  Tobacco Use  . Smoking status: Never Smoker  . Smokeless tobacco:  Never Used  Vaping Use  . Vaping Use: Never used  Substance and Sexual Activity  . Alcohol use: Not Currently  . Drug use: No  . Sexual activity: Yes    Birth control/protection: None  Other Topics Concern  . Not on file  Social History Narrative  . Not on file   Social Determinants of Health   Financial Resource Strain:   . Difficulty of Paying Living Expenses: Not on file  Food Insecurity:   . Worried About Programme researcher, broadcasting/film/video in the Last Year: Not on file  . Ran Out of Food in the Last Year: Not on file  Transportation Needs:   . Lack of Transportation (Medical): Not on file  . Lack of Transportation (Non-Medical): Not on file  Physical Activity:   . Days of Exercise per Week: Not on file  . Minutes of Exercise per Session: Not on file  Stress:   . Feeling of Stress : Not on file  Social Connections:   . Frequency of Communication with Friends and Family: Not on file  .  Frequency of Social Gatherings with Friends and Family: Not on file  . Attends Religious Services: Not on file  . Active Member of Clubs or Organizations: Not on file  . Attends Banker Meetings: Not on file  . Marital Status: Not on file    Subjective: Review of Systems  Constitutional: Negative for chills, fever, malaise/fatigue and weight loss.  HENT: Negative for congestion and sore throat.   Respiratory: Negative for cough and shortness of breath.   Cardiovascular: Negative for chest pain and palpitations.  Gastrointestinal: Negative for abdominal pain, blood in stool, diarrhea, melena, nausea and vomiting.  Musculoskeletal: Negative for joint pain and myalgias.  Skin: Negative for rash.  Neurological: Negative for dizziness and weakness.  Endo/Heme/Allergies: Does not bruise/bleed easily.  Psychiatric/Behavioral: Negative for depression. The patient is not nervous/anxious.   All other systems reviewed and are negative.    Objective: BP 127/85   Pulse 74   Temp (!) 97.1 F  (36.2 C) (Oral)   Ht 5\' 5"  (1.651 m)   Wt 210 lb 3.2 oz (95.3 kg)   LMP 02/18/2020   BMI 34.98 kg/m  Physical Exam Vitals and nursing note reviewed.  Constitutional:      General: She is not in acute distress.    Appearance: Normal appearance. She is well-developed. She is not ill-appearing, toxic-appearing or diaphoretic.  HENT:     Head: Normocephalic and atraumatic.     Nose: No congestion or rhinorrhea.  Eyes:     General: No scleral icterus. Cardiovascular:     Rate and Rhythm: Normal rate and regular rhythm.     Heart sounds: Normal heart sounds.  Pulmonary:     Effort: Pulmonary effort is normal. No respiratory distress.     Breath sounds: Normal breath sounds.  Abdominal:     General: Bowel sounds are normal.     Palpations: Abdomen is soft. There is no hepatomegaly, splenomegaly or mass.     Tenderness: There is no abdominal tenderness. There is no guarding or rebound.     Hernia: No hernia is present.  Skin:    General: Skin is warm and dry.     Coloration: Skin is not jaundiced.     Findings: No rash.  Neurological:     General: No focal deficit present.     Mental Status: She is alert and oriented to person, place, and time.  Psychiatric:        Attention and Perception: Attention normal.        Mood and Affect: Mood normal.        Speech: Speech normal.        Behavior: Behavior normal.        Thought Content: Thought content normal.        Cognition and Memory: Cognition and memory normal.      Assessment:  Very pleasant 24 year old female who presents for follow-up on abdominal pain and GERD.  Also notes left lower quadrant persistent abdominal discomfort.  GERD generally well managed on her current regimen.  As per HPI, she seems to have an element of constipation although no hard stools or straining.  This could be causing her abdominal pain and we will try to manage appropriately and see if her pain improves  GERD: Generally well managed on once  daily omeprazole 40 mg.  Occasional breakthrough that she takes antacid to use that typically help.  Recommend she continue her current medication.  Currently her symptoms are well managed  Abdominal pain: The patient describes persistent left lower quadrant abdominal discomfort, described as crampy.  Symptoms not well managed as this is been ongoing for some time.  No severe pain.  No worsening pain on exam.  I feel this is likely due to constipation as per below.  We will try to better manage her constipation and see if this has an effect on her abdominal pain  Constipation: The patient has a stool as infrequent as once a week or less.  However, she does not have hard stools or straining which is a bit perplexing.  She may have chronic, mild, ongoing constipation causing left lower quadrant abdominal discomfort.  She is trying to increase her water intake rather than sodas.  She knows she does not eat enough fiber.  At this point I will check an abdominal x-ray for any significant stool burden.  After her x-ray I have asked her to start taking a fiber supplement once a day for 2 weeks at which point she can increase to twice a day if needed.  Hopefully this will promote more regular bowel movements and improvement in abdominal discomfort   Plan: 1. Continue daily PPI 2. Abdominal 2 view 3. Increase water intake and dietary fiber intake 4. Start fiber supplement once a day for 2 weeks, increase to twice a day after that if needed 5. Follow-up in 3 months 6. Call for worsening symptoms    Thank you for allowing us to participate in the care of Ricardo Aho  Wynne DustEric Joany Khatib, DNP, AGNP-C Adult & Gerontological Nurse Practitioner [facility]   03/21/2020 9:53 AM   Disclaimer: This note was dictated with voice recognition software. Similar sounding words can inadvertently be transcribed and may not be corrected upon review.

## 2020-06-07 ENCOUNTER — Encounter: Payer: Self-pay | Admitting: Adult Health

## 2020-06-07 ENCOUNTER — Other Ambulatory Visit: Payer: Self-pay

## 2020-06-07 ENCOUNTER — Ambulatory Visit (INDEPENDENT_AMBULATORY_CARE_PROVIDER_SITE_OTHER): Payer: Medicaid Other | Admitting: Adult Health

## 2020-06-07 VITALS — BP 129/82 | HR 84 | Ht 64.5 in | Wt 203.0 lb

## 2020-06-07 DIAGNOSIS — Z319 Encounter for procreative management, unspecified: Secondary | ICD-10-CM

## 2020-06-07 NOTE — Progress Notes (Signed)
  Subjective:     Patient ID: Dana Lane, female   DOB: 12-10-1995, 24 y.o.   MRN: [id number]  HPI Dana Lane is a 24 year old white female,single, G0P0, in to talk about periods and getting pregnant. Her periods regular on OCs, can stop after this pack. PCP is Jacquelin Hawking PA  Review of Systems Periods regular on OCs Wants to get pregnant Reviewed past medical,surgical, social and family history. Reviewed medications and allergies.     Objective:   Physical Exam BP 129/82 (BP Location: Left Arm, Patient Position: Sitting, Cuff Size: Normal)   Pulse 84   Ht 5' 4.5" (1.638 m)   Wt 203 lb (92.1 kg)   LMP 04/27/2020   BMI 34.31 kg/m  Skin warm and dry. Neck: mid line trachea, normal thyroid, good ROM, no lymphadenopathy noted. Lungs: clear to ausculation bilaterally. Cardiovascular: regular rate and rhythm. Fall risk is low  Upstream - 06/07/20 0955      Pregnancy Intention Screening   Does the patient want to become pregnant in the next year? Yes    Does the patient's partner want to become pregnant in the next year? Yes    Would the patient like to discuss contraceptive options today? No      Contraception Wrap Up   Current Method Oral Contraceptive    End Method Pregnant/Seeking Pregnancy    Contraception Counseling Provided No             Assessment:     1. Patient desires pregnancy Finish current pack of OCs Take PNV Discussed timing of sex Call with next period, will check progesterone level day 21 If no ovulation will try clomid    Plan:     Follow up prn Has appt with PCP in February she says

## 2020-06-20 ENCOUNTER — Ambulatory Visit: Payer: Medicaid Other | Admitting: Nurse Practitioner

## 2020-06-25 ENCOUNTER — Telehealth: Payer: Self-pay | Admitting: Adult Health

## 2020-06-25 DIAGNOSIS — Z319 Encounter for procreative management, unspecified: Secondary | ICD-10-CM

## 2020-06-25 NOTE — Telephone Encounter (Signed)
Started her cycle Saturday States she was supposed to call to let Victorino Dike know

## 2020-06-25 NOTE — Telephone Encounter (Signed)
Will check progesterone level 07/13/20 pt aware that order is in

## 2020-07-14 LAB — PROGESTERONE: Progesterone: 8.2 ng/mL

## 2020-08-01 ENCOUNTER — Ambulatory Visit: Payer: Medicaid Other | Admitting: Nurse Practitioner

## 2020-08-01 ENCOUNTER — Other Ambulatory Visit: Payer: Self-pay | Admitting: Adult Health

## 2020-08-01 ENCOUNTER — Ambulatory Visit: Payer: Medicaid Other | Admitting: Physician Assistant

## 2020-08-01 DIAGNOSIS — Z319 Encounter for procreative management, unspecified: Secondary | ICD-10-CM

## 2020-08-01 NOTE — Progress Notes (Signed)
Ck progesterone 2/21

## 2020-08-14 LAB — PROGESTERONE: Progesterone: 4.6 ng/mL

## 2020-09-06 ENCOUNTER — Other Ambulatory Visit: Payer: Self-pay | Admitting: Physician Assistant

## 2020-09-19 ENCOUNTER — Other Ambulatory Visit: Payer: Self-pay

## 2020-09-19 ENCOUNTER — Encounter: Payer: Self-pay | Admitting: Nurse Practitioner

## 2020-09-19 ENCOUNTER — Ambulatory Visit (INDEPENDENT_AMBULATORY_CARE_PROVIDER_SITE_OTHER): Payer: Self-pay | Admitting: Nurse Practitioner

## 2020-09-19 VITALS — BP 125/84 | HR 70 | Temp 97.1°F | Ht 65.0 in | Wt 214.6 lb

## 2020-09-19 DIAGNOSIS — R110 Nausea: Secondary | ICD-10-CM

## 2020-09-19 DIAGNOSIS — R1010 Upper abdominal pain, unspecified: Secondary | ICD-10-CM

## 2020-09-19 DIAGNOSIS — K219 Gastro-esophageal reflux disease without esophagitis: Secondary | ICD-10-CM

## 2020-09-19 HISTORY — DX: Nausea: R11.0

## 2020-09-19 MED ORDER — DEXLANSOPRAZOLE 60 MG PO CPDR: 60.0000 mg | DELAYED_RELEASE_CAPSULE | Freq: Every day | ORAL | 3 refills | Status: DC

## 2020-09-19 NOTE — Progress Notes (Signed)
Cc'ed to pcp °

## 2020-09-19 NOTE — Patient Instructions (Signed)
Your health issues we discussed today were:   GERD (reflux/heartburn) with nausea and upper abdominal discomfort: 1. I am sorry you are not doing well! 2. I think at this point it is time to change to a different medication 3. Stop taking omeprazole 4. Start taking Dexilant 60 mg once a day, pursing in the morning on an empty stomach 5. Call us in 3 to 4 weeks and let us know if it is helping 6. Call us if you have any worsening or severe symptoms  Overall I recommend:  1. Continue other current medications 2. Return for follow-up in 3 to 4 weeks 3. Call us for any questions or concerns   ---------------------------------------------------------------  At [facility] we value your feedback. You may receive a survey about your visit today. Please share your experience as we strive to create trusting relationships with our patients to provide genuine, compassionate, quality care.  We appreciate your understanding and patience as we review any laboratory studies, imaging, and other diagnostic tests that are ordered as we care for you. Our office policy is 5 business days for review of these results, and any emergent or urgent results are addressed in a timely manner for your best interest. If you do not hear from our office in 1 week, please contact us.   We also encourage the use of MyChart, which contains your medical information for your review as well. If you are not enrolled in this feature, an access code is on this after visit summary for your convenience. Thank you for allowing [place] to be involved in your care.  It was great to see you today!  I hope you have a great spring!!    ---------------------------------------------------------------

## 2020-09-19 NOTE — Progress Notes (Signed)
Referring Provider: Jacquelin Hawking, PA-C Primary Care Physician:  Jacquelin Hawking, PA-C Primary GI:  Dr. Jena Gauss  Chief Complaint  Patient presents with  . Gastroesophageal Reflux    Nausea daily, burping acid up    HPI:   Dana Lane is a 25 y.o. female who presents for 44-month follow-up.  Patient was last seen in our office 03/21/2020 for GERD, left lower quadrant abdominal pain, constipation.  Noted chronic history of GERD generally well controlled on omeprazole 40 mg daily with recent worsening.  Also history of anemia with drifting hemoglobin and iron deficiency.  Updated colonoscopy in 2020 with normal colon and suspected trivial bleeding.  EGD with multiple gastric polyps status post resection and biopsy and abnormal gastric mucosa of doubtful clinical significance status post biopsy.  Otherwise normal.  Surgical pathology, biopsies to be chronic inactive gastritis and a polyp to be fundic gland polyp.  Hemoglobin subsequently normalized.  BPE completed 07/29/2019 Small sliding hiatal hernia and otherwise negative.  At her last visit no further bleeding for a few weeks, GERD doing well on omeprazole with occasional breakthrough.  Still with some left lower quadrant/side pain that is ongoing and constant.  Bowel movement is infrequent as once a week but no straining or hard stools.  Recent nausea but no vomiting.  Trying to increase water intake, admits she does not eat enough fiber.  Recommended continue daily PPI, two-view abdominal x-ray, increase water and dietary fiber intake, start fiber supplement once a day for 2 weeks and increase to twice a day after that, follow-up in 3 months.  Abdominal x-ray 03/22/2020 found moderate colonic stool burden without obstructive bowel pattern, stool ball, fecal impaction.  Reinforced recommendations of increased water and fiber intake as well as fiber supplement.  Today she states she doing okay overall. States she is having a lot of GERD-like  symptoms. She has been on omeprazole 40 mg daily and at times will do well, and at times have significant symptoms. Avoids trigger symptoms. Symptoms are not worse based on time of day. Sy,ptoms occur about 3-4 times a week, typically lasts a couple days. Will use "acid chews" for breakthrough which is not very effective. Recent EGD as outlined above. Has only been on omeprazole and pantoprazole. Having her typical GERD symptoms including esophageal burning, nausea, reflux of bitter material. No vomiting. Denies other abdominal pain, hematochezia, melena, fever, chills, unintentional weight loss. Denies URI or flu-like symptoms. Denies loss of sense of taste or smell. The patient has not received COVID-19 vaccination(s). They are not interested in vaccine scheduling information. Denies chest pain, dyspnea, dizziness, lightheadedness, syncope, near syncope. Denies any other upper or lower GI symptoms.  Past Medical History:  Diagnosis Date  . Anemia   . Ankle sprain    right  . Depression   . Fatty liver disease, nonalcoholic   . GERD (gastroesophageal reflux disease)   . Pre-diabetes   . Tonsillitis     Past Surgical History:  Procedure Laterality Date  . BIOPSY  05/02/2019   Procedure: BIOPSY;  Surgeon: Corbin Ade, MD;  [facility];  Service: Endoscopy;;  gastric polyps   . COLONOSCOPY WITH PROPOFOL N/A 05/02/2019   Procedure: COLONOSCOPY WITH PROPOFOL;  Surgeon: Corbin Ade, MD;  [facility]: [facility];  Service: Endoscopy;  Laterality: N/A;  1:15pm  . ESOPHAGOGASTRODUODENOSCOPY (EGD) WITH PROPOFOL N/A 05/02/2019   Procedure: ESOPHAGOGASTRODUODENOSCOPY (EGD) WITH PROPOFOL;  Surgeon: Corbin Ade, MD;  [facility];  Service: Endoscopy;  Laterality: N/A;  . EYE SURGERY  25yo  . TONSILLECTOMY AND ADENOIDECTOMY Bilateral 11/16/2018   Procedure: TONSILLECTOMY AND ADENOIDECTOMY;  Surgeon: Newman Pies, MD;  Location: [facility];  Service: ENT;   Laterality: Bilateral;    Current Outpatient Medications  Medication Sig Dispense Refill  . omeprazole (PRILOSEC) 40 MG capsule Take 1 capsule (40 mg total) by mouth daily. 90 capsule 1  . Prenatal Vit-Fe Fumarate-FA (MULTIVITAMIN-PRENATAL) 27-0.8 MG TABS tablet Take 1 tablet by mouth daily at 12 noon.    Marland Kitchen PROZAC 20 MG capsule Take 20 mg by mouth daily.     No current facility-administered medications for this visit.    Allergies as of 09/19/2020 - Review Complete 09/19/2020  Allergen Reaction Noted  . Poison oak extract [poison oak extract] Itching and Rash 01/09/2016  . Chocolate Itching and Swelling 12/07/2014  . Eggs or egg-derived products Diarrhea and Other (See Comments) 12/07/2014    Family History  Problem Relation Age of Onset  . Diabetes Mother   . Cervical cancer Mother        ? patient isn't sure. Thinks mom had this when she was younger (<50 y.o.)  . Cancer Father        prostate  . Depression Sister   . Cancer Sister        bladder  . Heart disease Paternal Grandfather   . Colon polyps Neg Hx   . Colon cancer Neg Hx     Social History   Socioeconomic History  . Marital status: Significant Other    Spouse name: Not on file  . Number of children: Not on file  . Years of education: 46  . Highest education level: Not on file  Occupational History  . Not on file  Tobacco Use  . Smoking status: Never Smoker  . Smokeless tobacco: Never Used  Vaping Use  . Vaping Use: Never used  Substance and Sexual Activity  . Alcohol use: Not Currently  . Drug use: No  . Sexual activity: Yes    Birth control/protection: Pill  Other Topics Concern  . Not on file  Social History Narrative  . Not on file   Social Determinants of Health   Financial Resource Strain: Not on file  Food Insecurity: Not on file  Transportation Needs: Not on file  Physical Activity: Not on file  Stress: Not on file  Social Connections: Not on file    Subjective: Review of Systems   Constitutional: Negative for chills, fever, malaise/fatigue and weight loss.  HENT: Negative for congestion and sore throat.   Respiratory: Negative for cough and shortness of breath.   Cardiovascular: Negative for chest pain and palpitations.  Gastrointestinal: Positive for heartburn. Negative for abdominal pain, blood in stool, constipation, diarrhea, melena, nausea and vomiting.  Musculoskeletal: Negative for joint pain and myalgias.  Skin: Negative for rash.  Neurological: Negative for dizziness and weakness.  Endo/Heme/Allergies: Does not bruise/bleed easily.  Psychiatric/Behavioral: Negative for depression. The patient is not nervous/anxious.   All other systems reviewed and are negative.    Objective: BP 125/84   Pulse 70   Temp (!) 97.1 F (36.2 C)   Ht 5\' 5"  (1.651 m)   Wt 214 lb 9.6 oz (97.3 kg)   LMP 09/16/2020   BMI 35.71 kg/m  Physical Exam Vitals and nursing note reviewed.  Constitutional:      General: She is not in acute distress.    Appearance: Normal appearance. She is  well-developed. She is obese. She is not ill-appearing, toxic-appearing or diaphoretic.  HENT:     Head: Normocephalic and atraumatic.     Nose: No congestion or rhinorrhea.  Eyes:     General: No scleral icterus. Cardiovascular:     Rate and Rhythm: Normal rate and regular rhythm.     Heart sounds: Normal heart sounds.  Pulmonary:     Effort: Pulmonary effort is normal. No respiratory distress.     Breath sounds: Normal breath sounds.  Abdominal:     General: Bowel sounds are normal.     Palpations: Abdomen is soft. There is no hepatomegaly, splenomegaly or mass.     Tenderness: There is no abdominal tenderness. There is no guarding or rebound.     Hernia: No hernia is present.  Skin:    General: Skin is warm and dry.     Coloration: Skin is not jaundiced.     Findings: No rash.  Neurological:     General: No focal deficit present.     Mental Status: She is alert and oriented to  person, place, and time.  Psychiatric:        Attention and Perception: Attention normal.        Mood and Affect: Mood normal.        Speech: Speech normal.        Behavior: Behavior normal.        Thought Content: Thought content normal.        Cognition and Memory: Cognition and memory normal.      Assessment:  Very pleasant 25 year old female presents to follow-up on GERD.  Her GERD symptoms are currently not doing well.  Also with associated nausea.  No other red flag/warning signs or symptoms.  GERD: Having worsening GERD symptoms about 2-3 times a week which last for couple days each time.  Symptoms are not time dependent.  Has tried over-the-counter breakthrough medication to help which is not very effective.  She has been on omeprazole for some time.  She has this pattern where sometimes her come in to be doing well and sometimes she will not be doing well.  I think it is time to change to a different PPI.  She is only ever tried Protonix otherwise.  Associated nausea likely related.  Symptoms as described in HPI.  Further recommendations to follow based on clinical response to Dexilant.  Nausea without vomiting: Likely related to her GERD symptoms.  Seem to be somewhat worse in the past week likely because her GERD is gotten progressively worse.  Hopefully changing PPI will provide better relief.  I requested a progress report in 3 to 4 weeks.  Further recommendations to follow based on her clinical response   Plan: 1. Stop omeprazole 2. Start Dexilant 60 mg daily 3. Progress report in 3 to 4 weeks 4. Follow-up in 3 months    Thank you for allowing [place] to participate in the care of Dana Lane  Wynne Dust, DNP, AGNP-C Adult & Gerontological Nurse Practitioner [facility]   09/19/2020 9:19 AM   Disclaimer: This note was dictated with voice recognition software. Similar sounding words can inadvertently be transcribed and may not be corrected upon  review.

## 2020-09-20 ENCOUNTER — Telehealth: Payer: Self-pay | Admitting: Internal Medicine

## 2020-09-20 DIAGNOSIS — K219 Gastro-esophageal reflux disease without esophagitis: Secondary | ICD-10-CM

## 2020-09-20 DIAGNOSIS — R1010 Upper abdominal pain, unspecified: Secondary | ICD-10-CM

## 2020-09-20 NOTE — Telephone Encounter (Signed)
Pt called to say her prescription needs to go to [facility]. [phone]

## 2020-09-21 NOTE — Telephone Encounter (Signed)
Routing to [facility] Refill box. Pt wants Dexilant 60 mg sent to [facility].

## 2020-09-24 ENCOUNTER — Other Ambulatory Visit: Payer: Self-pay

## 2020-09-24 ENCOUNTER — Emergency Department (HOSPITAL_COMMUNITY)
Admission: EM | Admit: 2020-09-24 | Discharge: 2020-09-24 | Disposition: A | Discharge status: Home / Self Care | Payer: Medicaid Other | Attending: Emergency Medicine | Admitting: Emergency Medicine

## 2020-09-24 ENCOUNTER — Encounter (HOSPITAL_COMMUNITY): Payer: Self-pay

## 2020-09-24 DIAGNOSIS — L089 Local infection of the skin and subcutaneous tissue, unspecified: Secondary | ICD-10-CM | POA: Insufficient documentation

## 2020-09-24 LAB — CBG MONITORING, ED: Glucose-Capillary: 74 mg/dL (ref 70–99)

## 2020-09-24 MED ORDER — SULFAMETHOXAZOLE-TRIMETHOPRIM 800-160 MG PO TABS: 1.0000 | ORAL_TABLET | Freq: Two times a day (BID) | ORAL | 0 refills | Status: DC

## 2020-09-24 MED ORDER — DEXLANSOPRAZOLE 60 MG PO CPDR: 60.0000 mg | DELAYED_RELEASE_CAPSULE | Freq: Every day | ORAL | 3 refills | Status: DC

## 2020-09-24 NOTE — Telephone Encounter (Signed)
Completed.

## 2020-09-24 NOTE — ED Notes (Signed)
cbg [id number] 

## 2020-09-24 NOTE — ED Provider Notes (Signed)
[facility] EMERGENCY DEPARTMENT Provider Note   CSN: [id number] Arrival date & time: 09/24/20  1238     History Chief Complaint  Patient presents with  . Breast Pain    Dana Lane is a 25 y.o. female.  Pt complains of a swollen painful area left breast.  Pt reports she noticed area 3 days ago.  Pt reports increased pain.  Pt denies fever or chills   The history is provided by the patient. No language interpreter was used.       Past Medical History:  Diagnosis Date  . Anemia   . Ankle sprain    right  . Depression   . Fatty liver disease, nonalcoholic   . GERD (gastroesophageal reflux disease)   . Pre-diabetes   . Tonsillitis     Patient Active Problem List   Diagnosis Date Noted  . Nausea without vomiting 09/19/2020  . Abdominal pain 03/21/2020  . Constipation 03/21/2020  . Anemia 06/28/2019  . Pregnancy test negative 05/25/2019  . Missed periods 05/05/2019  . Weight gain 05/05/2019  . Patient desires pregnancy 05/05/2019  . Rectal bleeding 03/17/2019  . Pain of upper abdomen 03/17/2019  . MDD (major depressive disorder), recurrent episode, moderate (HCC) 03/25/2018  . Obesity (BMI 30-39.9) 12/11/2017  . Chest wall pain 09/14/2015  . Gastroesophageal reflux disease 06/18/2015    Past Surgical History:  Procedure Laterality Date  . BIOPSY  05/02/2019   Procedure: BIOPSY;  Surgeon: Corbin Ade, MD;  [facility];  Service: Endoscopy;;  gastric polyps   . COLONOSCOPY WITH PROPOFOL N/A 05/02/2019   Procedure: COLONOSCOPY WITH PROPOFOL;  Surgeon: Corbin Ade, MD;  [facility];  Service: Endoscopy;  Laterality: N/A;  1:15pm  . ESOPHAGOGASTRODUODENOSCOPY (EGD) WITH PROPOFOL N/A 05/02/2019   Procedure: ESOPHAGOGASTRODUODENOSCOPY (EGD) WITH PROPOFOL;  Surgeon: Corbin Ade, MD;  [facility];  Service: Endoscopy;  Laterality: N/A;  . EYE SURGERY  25yo  . TONSILLECTOMY AND ADENOIDECTOMY Bilateral 11/16/2018   Procedure:  TONSILLECTOMY AND ADENOIDECTOMY;  Surgeon: Newman Pies, MD;  Location: [facility];  Service: ENT;  Laterality: Bilateral;     OB History    Gravida  0   Para  0   Term  0   Preterm  0   AB  0   Living  0     SAB  0   IAB  0   Ectopic  0   Multiple  0   Live Births  0           Family History  Problem Relation Age of Onset  . Diabetes Mother   . Cervical cancer Mother        ? patient isn't sure. Thinks mom had this when she was younger (<50 y.o.)  . Cancer Father        prostate  . Depression Sister   . Cancer Sister        bladder  . Heart disease Paternal Grandfather   . Colon polyps Neg Hx   . Colon cancer Neg Hx     Social History   Tobacco Use  . Smoking status: Never Smoker  . Smokeless tobacco: Never Used  Vaping Use  . Vaping Use: Never used  Substance Use Topics  . Alcohol use: Not Currently  . Drug use: No    Home Medications Prior to Admission medications   Medication Sig Start Date End Date Taking? Authorizing Provider  sulfamethoxazole-trimethoprim (BACTRIM  DS) 800-160 MG tablet Take 1 tablet by mouth 2 (two) times daily. 09/24/20  Yes Elson Areas, PA-C  dexlansoprazole (DEXILANT) 60 MG capsule Take 1 capsule (60 mg total) by mouth daily. 09/24/20   Gelene Mink, NP  omeprazole (PRILOSEC) 40 MG capsule Take 1 capsule (40 mg total) by mouth daily. 02/09/20   Jacquelin Hawking, PA-C  Prenatal Vit-Fe Fumarate-FA (MULTIVITAMIN-PRENATAL) 27-0.8 MG TABS tablet Take 1 tablet by mouth daily at 12 noon.    [provider]  PROZAC 20 MG capsule Take 20 mg by mouth daily. 06/13/20   [provider]    Allergies    Poison oak extract [poison oak extract], Chocolate, and Eggs or egg-derived products  Review of Systems   Review of Systems  All other systems reviewed and are negative.   Physical Exam Updated Vital Signs BP 134/83 (BP Location: Right Arm)   Pulse 82   Temp 98 F (36.7 C) (Oral)   Resp 18    Ht 5\' 5"  (1.651 m)   Wt 97.1 kg   LMP 09/16/2020   SpO2 100%   BMI 35.61 kg/m   Physical Exam Vitals and nursing note reviewed.  Constitutional:      Appearance: She is well-developed.  HENT:     Head: Normocephalic.     Mouth/Throat:     Mouth: Mucous membranes are moist.  Eyes:     Pupils: Pupils are equal, round, and reactive to light.  Cardiovascular:     Rate and Rhythm: Normal rate and regular rhythm.     Comments: Swollen area crease of left breast.  2cm tender area, no fluctuance.   Pulmonary:     Effort: Pulmonary effort is normal.  Abdominal:     General: There is no distension.  Musculoskeletal:        General: Normal range of motion.     Cervical back: Normal range of motion.  Skin:    General: Skin is warm.  Neurological:     General: No focal deficit present.     Mental Status: She is alert and oriented to person, place, and time.  Psychiatric:        Mood and Affect: Mood normal.     ED Results / Procedures / Treatments   Labs (all labs ordered are listed, but only abnormal results are displayed) Labs Reviewed  CBG MONITORING, ED    EKG None  Radiology No results found.  Procedures Procedures   Medications Ordered in ED Medications - No data to display  ED Course  I have reviewed the triage vital signs and the nursing notes.  Pertinent labs & imaging results that were available during my care of the patient were reviewed by me and considered in my medical decision making (see chart for details).    MDM Rules/Calculators/A&P                          MDM:  Pt advised warm compresses, bactrim given po  Final Clinical Impression(s) / ED Diagnoses Final diagnoses:  Skin infection    Rx / DC Orders ED Discharge Orders         Ordered    sulfamethoxazole-trimethoprim (BACTRIM DS) 800-160 MG tablet  2 times daily        09/24/20 1351        An After Visit Summary was printed and given to the patient.    11/24/20,  Elson Areas 09/24/20 2012  Bethann Berkshire, MD 09/26/20 [phone]

## 2020-09-24 NOTE — Addendum Note (Signed)
Addended by: Gelene Mink on: 09/24/2020 02:04 PM   Modules accepted: Orders

## 2020-09-24 NOTE — Discharge Instructions (Addendum)
Warm compresses 20 minutes 4 times a day.  Recheck in 2 days at urgent care or here.

## 2020-09-24 NOTE — ED Triage Notes (Signed)
Pt reports has a painful knot under left breast x 1 week.

## 2020-10-02 IMAGING — DX DG ABDOMEN 2V
2 series · 2 of 2 positions shown · non-contrast
Comparison: None.

CLINICAL DATA: Constipation left lower quadrant pain, assess stool
burden

EXAM:
X-RAY ABDOMEN UPRIGHT AND SUPINE VIEWS

[abdomen erect]
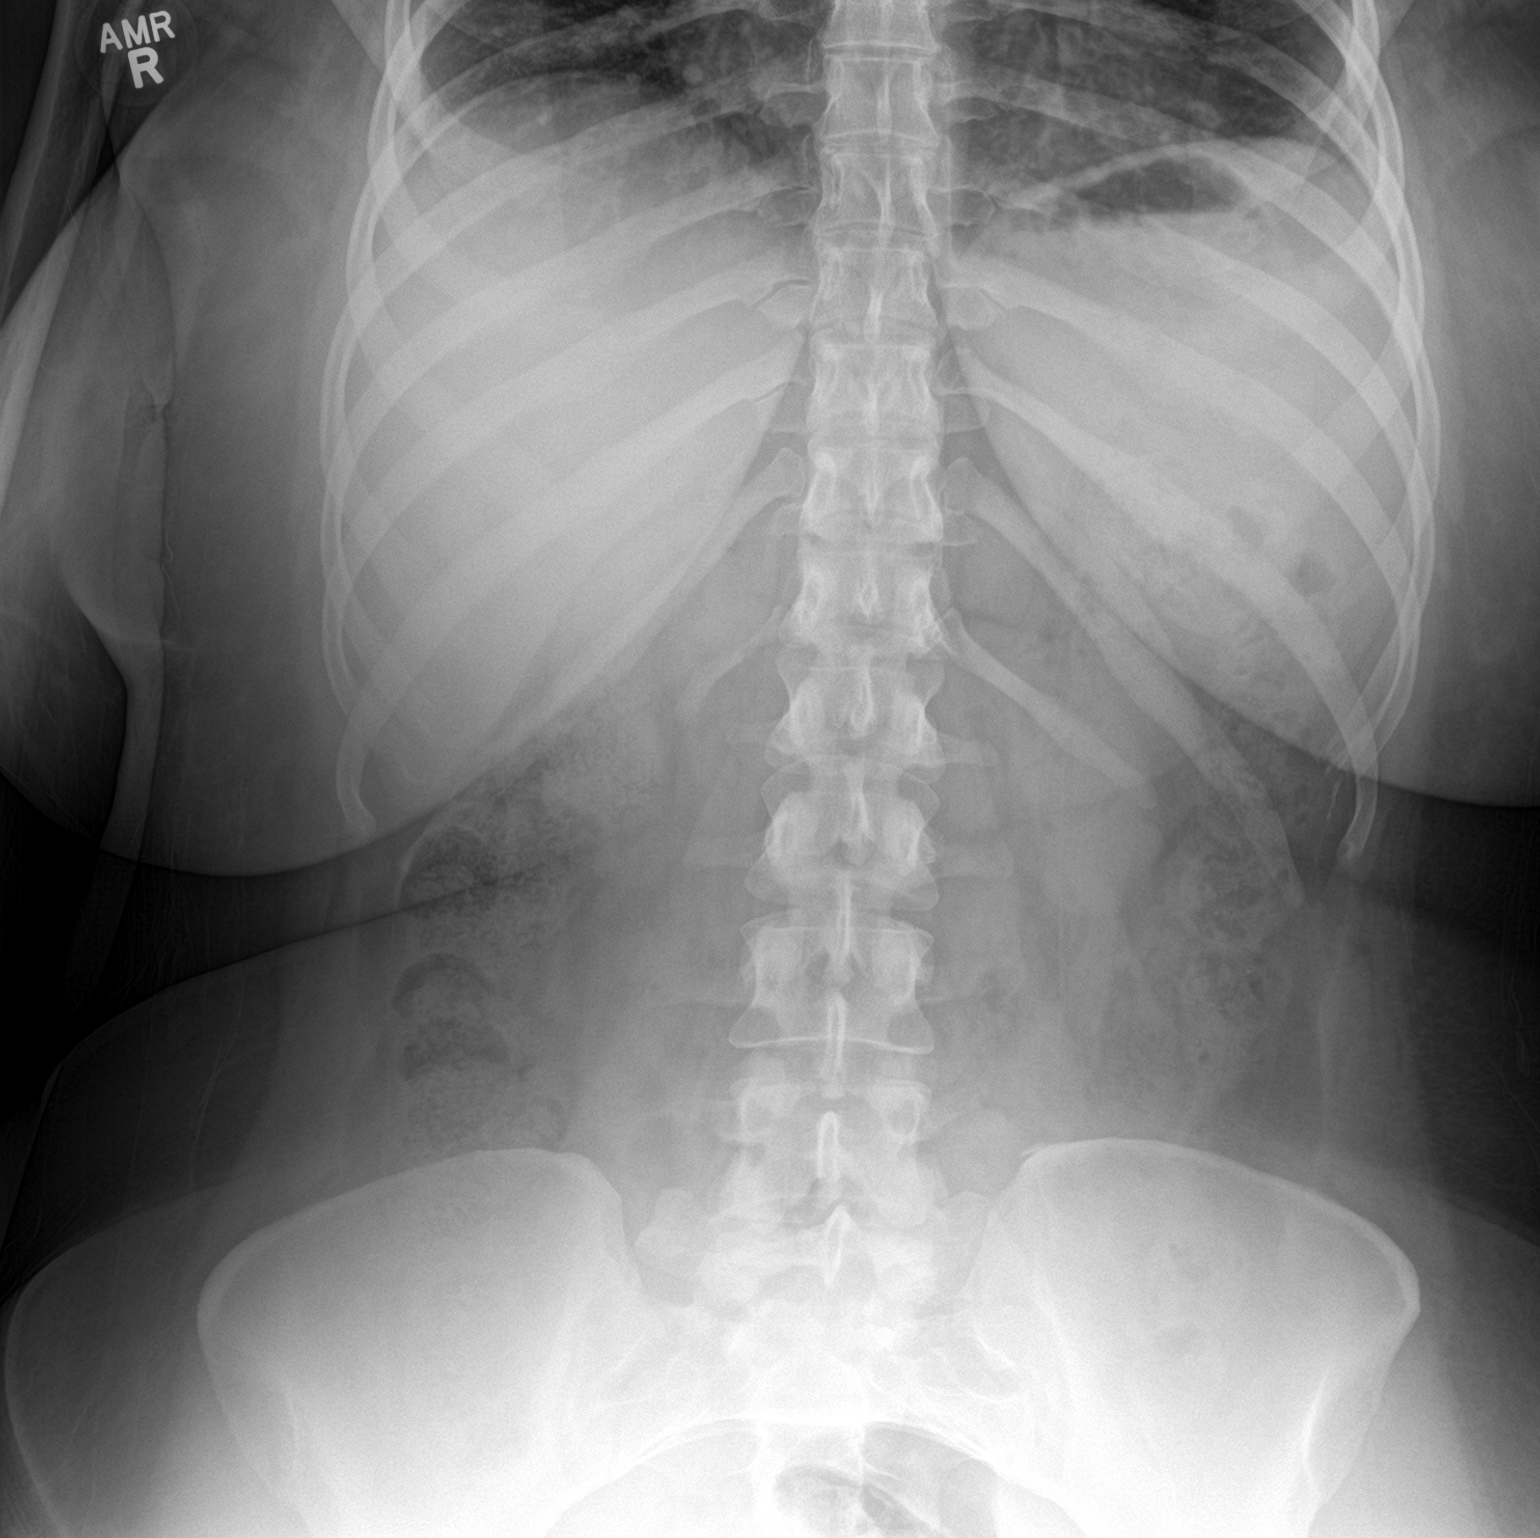

[abdomen supine]
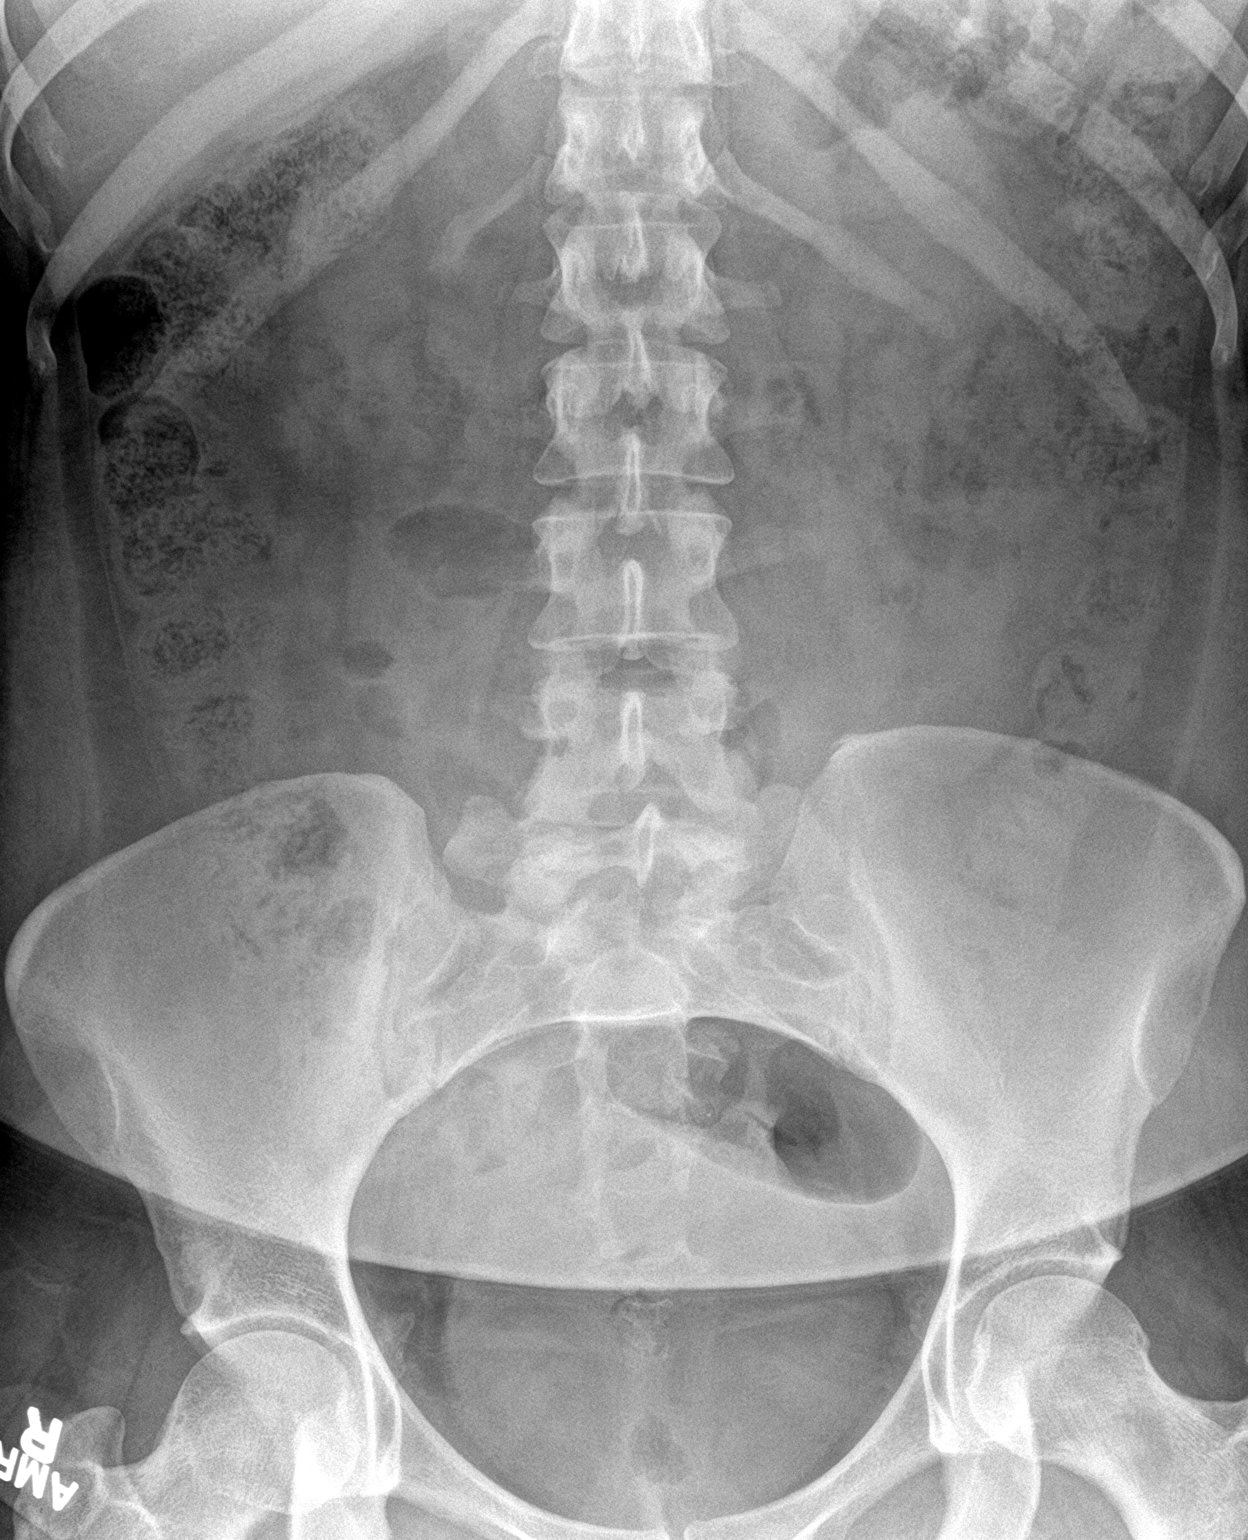

[2 of 2 positions shown; findings below may reference images not displayed]

FINDINGS: No high-grade obstructive bowel gas pattern is seen. There is a
moderate colonic stool burden without evidence of inspissated stool
ball or fecal impaction. No suspicious calcifications over the
gallbladder fossa or urinary tract. Lung bases are clear. No
subdiaphragmatic free air. Partial sacralization of the lowest
lumbar vertebrae. Minimal enthesopathic changes on the left iliac
crest. Osseous structures otherwise unremarkable.
IMPRESSION: 1. Moderate colonic stool burden. No high-grade obstructive bowel
gas pattern. No evidence of inspissated stool ball or fecal
impaction.

## 2020-10-04 ENCOUNTER — Encounter: Payer: Self-pay | Admitting: Gastroenterology

## 2020-10-24 ENCOUNTER — Encounter: Payer: Self-pay | Admitting: Gastroenterology

## 2020-11-09 ENCOUNTER — Emergency Department (HOSPITAL_COMMUNITY)
Admission: EM | Admit: 2020-11-09 | Discharge: 2020-11-09 | Disposition: A | Discharge status: Home / Self Care | Payer: Self-pay | Attending: Emergency Medicine | Admitting: Emergency Medicine

## 2020-11-09 ENCOUNTER — Other Ambulatory Visit: Payer: Self-pay

## 2020-11-09 ENCOUNTER — Encounter (HOSPITAL_COMMUNITY): Payer: Self-pay | Admitting: Emergency Medicine

## 2020-11-09 DIAGNOSIS — B9689 Other specified bacterial agents as the cause of diseases classified elsewhere: Secondary | ICD-10-CM | POA: Insufficient documentation

## 2020-11-09 DIAGNOSIS — N760 Acute vaginitis: Secondary | ICD-10-CM | POA: Insufficient documentation

## 2020-11-09 DIAGNOSIS — R7303 Prediabetes: Secondary | ICD-10-CM | POA: Insufficient documentation

## 2020-11-09 LAB — URINALYSIS, ROUTINE W REFLEX MICROSCOPIC
Bacteria, UA: NONE SEEN
Bilirubin Urine: NEGATIVE
Glucose, UA: NEGATIVE mg/dL
Ketones, ur: NEGATIVE mg/dL
Leukocytes,Ua: NEGATIVE
Nitrite: NEGATIVE
Protein, ur: NEGATIVE mg/dL
Specific Gravity, Urine: 1.006 (ref 1.005–1.030)
pH: 8 (ref 5.0–8.0)

## 2020-11-09 LAB — WET PREP, GENITAL
Sperm: NONE SEEN
Trich, Wet Prep: NONE SEEN
Yeast Wet Prep HPF POC: NONE SEEN

## 2020-11-09 LAB — PREGNANCY, URINE: Preg Test, Ur: NEGATIVE

## 2020-11-09 MED ORDER — METRONIDAZOLE 500 MG PO TABS: 500.0000 mg | ORAL_TABLET | Freq: Two times a day (BID) | ORAL | 0 refills | Status: DC

## 2020-11-09 NOTE — Discharge Instructions (Addendum)
You have been given a prescription for medication called metronidazole.  Your test today showed that you likely have a infection called bacterial vaginosis.  Is important that you take the medication as directed until its finished.  You may take Tylenol or ibuprofen if needed.  Follow-up with your gynecologist next week if your symptoms are not improving.  You may review your culture report in 2 to 3 days using the MyChart app.  You will also be notified of any positive results.

## 2020-11-09 NOTE — ED Provider Notes (Signed)
[facility] EMERGENCY DEPARTMENT Provider Note   CSN: [id number] Arrival date & time: 11/09/20  0901     History Chief Complaint  Patient presents with  . Dysuria    Dana Lane is a 26 y.o. female.  HPI     Dana Lane is a 25 y.o. female who presents to the Emergency Department complaining of burning pain to her vagina.  States that the burning sensation is worse with urination.  She denies hematuria, foul-smelling urine or dark urine.  Symptoms have been present x4 days.  She denies abdominal pain, fever, chills, nausea, vomiting, abnormal vaginal discharge or new sexual partners.  No abnormal vaginal bleeding.  She has not tried any home medications for her symptoms.    Past Medical History:  Diagnosis Date  . Anemia   . Ankle sprain    right  . Depression   . Fatty liver disease, nonalcoholic   . GERD (gastroesophageal reflux disease)   . Pre-diabetes   . Tonsillitis     Patient Active Problem List   Diagnosis Date Noted  . Nausea without vomiting 09/19/2020  . Abdominal pain 03/21/2020  . Constipation 03/21/2020  . Anemia 06/28/2019  . Pregnancy test negative 05/25/2019  . Missed periods 05/05/2019  . Weight gain 05/05/2019  . Patient desires pregnancy 05/05/2019  . Rectal bleeding 03/17/2019  . Pain of upper abdomen 03/17/2019  . MDD (major depressive disorder), recurrent episode, moderate (HCC) 03/25/2018  . Obesity (BMI 30-39.9) 12/11/2017  . Chest wall pain 09/14/2015  . Gastroesophageal reflux disease 06/18/2015    Past Surgical History:  Procedure Laterality Date  . BIOPSY  05/02/2019   Procedure: BIOPSY;  Surgeon: Corbin Ade, MD;  [facility]: [facility];  Service: Endoscopy;;  gastric polyps   . COLONOSCOPY WITH PROPOFOL N/A 05/02/2019   Procedure: COLONOSCOPY WITH PROPOFOL;  Surgeon: Corbin Ade, MD;  Location: [facility];  Service: Endoscopy;  Laterality: N/A;  1:15pm  . ESOPHAGOGASTRODUODENOSCOPY (EGD) WITH PROPOFOL N/A 05/02/2019    Procedure: ESOPHAGOGASTRODUODENOSCOPY (EGD) WITH PROPOFOL;  Surgeon: Corbin Ade, MD;  Location: [facility];  Service: Endoscopy;  Laterality: N/A;  . EYE SURGERY  25yo  . TONSILLECTOMY AND ADENOIDECTOMY Bilateral 11/16/2018   Procedure: TONSILLECTOMY AND ADENOIDECTOMY;  Surgeon: Newman Pies, MD;  Location: [facility];  Service: ENT;  Laterality: Bilateral;     OB History    Gravida  0   Para  0   Term  0   Preterm  0   AB  0   Living  0     SAB  0   IAB  0   Ectopic  0   Multiple  0   Live Births  0           Family History  Problem Relation Age of Onset  . Diabetes Mother   . Cervical cancer Mother        ? patient isn't sure. Thinks mom had this when she was younger (<50 y.o.)  . Cancer Father        prostate  . Depression Sister   . Cancer Sister        bladder  . Heart disease Paternal Grandfather   . Colon polyps Neg Hx   . Colon cancer Neg Hx     Social History   Tobacco Use  . Smoking status: Never Smoker  . Smokeless tobacco: Never Used  Vaping Use  . Vaping Use: Never used  Substance  Use Topics  . Alcohol use: Not Currently  . Drug use: No    Home Medications Prior to Admission medications   Medication Sig Start Date End Date Taking? Authorizing Provider  dexlansoprazole (DEXILANT) 60 MG capsule Take 1 capsule (60 mg total) by mouth daily. 09/24/20   Gelene Mink, NP  omeprazole (PRILOSEC) 40 MG capsule Take 1 capsule (40 mg total) by mouth daily. 02/09/20   Jacquelin Hawking, PA-C  Prenatal Vit-Fe Fumarate-FA (MULTIVITAMIN-PRENATAL) 27-0.8 MG TABS tablet Take 1 tablet by mouth daily at 12 noon.    [provider]  PROZAC 20 MG capsule Take 20 mg by mouth daily. 06/13/20   [provider]  sulfamethoxazole-trimethoprim (BACTRIM DS) 800-160 MG tablet Take 1 tablet by mouth 2 (two) times daily. 09/24/20   Elson Areas, PA-C    Allergies    Poison oak extract [poison oak extract], Chocolate, and Eggs  or egg-derived products  Review of Systems   Review of Systems  Constitutional: Negative for chills, fatigue and fever.  HENT: Negative for sore throat.   Respiratory: Negative for shortness of breath.   Cardiovascular: Negative for chest pain.  Gastrointestinal: Negative for abdominal pain, diarrhea, nausea and vomiting.  Genitourinary: Positive for dysuria. Negative for decreased urine volume, difficulty urinating, flank pain, genital sores, hematuria, pelvic pain, vaginal bleeding and vaginal discharge.  Musculoskeletal: Negative for back pain and myalgias.  Skin: Negative for rash.  Neurological: Negative for dizziness, weakness and numbness.  Hematological: Does not bruise/bleed easily.    Physical Exam Updated Vital Signs BP 128/84 (BP Location: Right Arm)   Pulse 86   Temp 98.5 F (36.9 C)   Resp 18   LMP 10/19/2020   SpO2 100%   Physical Exam Vitals and nursing note reviewed.  Constitutional:      Appearance: Normal appearance. She is not ill-appearing.  HENT:     Head: Normocephalic.     Mouth/Throat:     Mouth: Mucous membranes are moist.     Pharynx: No oropharyngeal exudate or posterior oropharyngeal erythema.  Neck:     Thyroid: No thyromegaly.     Meningeal: Kernig's sign absent.  Cardiovascular:     Rate and Rhythm: Normal rate and regular rhythm.     Pulses: Normal pulses.  Pulmonary:     Effort: Pulmonary effort is normal.     Breath sounds: Normal breath sounds. No wheezing.  Chest:     Chest wall: No tenderness.  Abdominal:     Palpations: Abdomen is soft.     Tenderness: There is no abdominal tenderness. There is no right CVA tenderness, left CVA tenderness, guarding or rebound.  Genitourinary:    Vagina: Vaginal discharge present. No erythema, tenderness or bleeding.     Cervix: Friability present. No cervical bleeding.     Uterus: Normal. Not tender.      Adnexa: Right adnexa normal.       Right: No mass or tenderness.         Left: No mass  or tenderness.       Comments: Pelvic exam chaperoned by nursing staff.  Small amount of thin white vaginal discharge present.  No palpable adnexal masses or tenderness.  No CMT. Musculoskeletal:        General: Normal range of motion.     Cervical back: Normal range of motion.  Skin:    General: Skin is warm.     Findings: No rash.  Neurological:     General: No  focal deficit present.     Mental Status: She is alert.     Sensory: No sensory deficit.     Motor: No weakness.     ED Results / Procedures / Treatments   Labs (all labs ordered are listed, but only abnormal results are displayed) Labs Reviewed  WET PREP, GENITAL - Abnormal; Notable for the following components:      Result Value   Clue Cells Wet Prep HPF POC PRESENT (*)    WBC, Wet Prep HPF POC MANY (*)    All other components within normal limits  URINALYSIS, ROUTINE W REFLEX MICROSCOPIC - Abnormal; Notable for the following components:   Color, Urine STRAW (*)    Hgb urine dipstick SMALL (*)    All other components within normal limits  PREGNANCY, URINE  GC/CHLAMYDIA PROBE AMP ([place]) NOT AT [facility]    EKG None  Radiology No results found.  Procedures Procedures   Medications Ordered in ED Medications - No data to display  ED Course  I have reviewed the triage vital signs and the nursing notes.  Pertinent labs & imaging results that were available during my care of the patient were reviewed by me and considered in my medical decision making (see chart for details).    MDM Rules/Calculators/A&P                           Patient here with reported dysuria and burning vaginal pain.  Denies abnormal vaginal discharge or abnormal vaginal bleeding.  No new sexual partners.  Urinalysis without evidence of infection.  Wet prep shows presence of clue cells and many WBCs.  Pregnancy negative.  GC and Chlamydia culture pending.  Patient well-appearing.  Doubt emergent process.  Will treat with  metronidazole and patient agrees to close follow-up with her GYN.  Feel she is appropriate for discharge at this time.  The patient appears reasonably screened and/or stabilized for discharge and I doubt any other medical condition or other [facility] requiring further screening, evaluation, or treatment in the ED at this time prior to discharge.  Final Clinical Impression(s) / ED Diagnoses Final diagnoses:  Bacterial vaginosis    Rx / DC Orders ED Discharge Orders    None       Pauline Aus, PA-C 11/09/20 1154    Vanetta Mulders, MD 11/09/20 1819

## 2020-11-09 NOTE — ED Triage Notes (Signed)
Pt reports dysuria that started Monday. Denies fevers.

## 2020-11-12 LAB — GC/CHLAMYDIA PROBE AMP (~~LOC~~) NOT AT ARMC
Chlamydia: NEGATIVE
Comment: NEGATIVE
Comment: NORMAL
Neisseria Gonorrhea: NEGATIVE

## 2020-11-20 ENCOUNTER — Ambulatory Visit: Payer: Medicaid Other | Admitting: Nurse Practitioner

## 2020-11-23 ENCOUNTER — Ambulatory Visit: Payer: Self-pay | Admitting: Gastroenterology

## 2020-11-29 ENCOUNTER — Other Ambulatory Visit: Payer: Self-pay

## 2020-11-29 ENCOUNTER — Ambulatory Visit: Payer: Medicaid Other | Admitting: Adult Health

## 2020-12-10 ENCOUNTER — Ambulatory Visit: Payer: Medicaid Other | Admitting: Adult Health

## 2020-12-10 ENCOUNTER — Other Ambulatory Visit: Payer: Self-pay

## 2020-12-13 ENCOUNTER — Ambulatory Visit: Payer: Medicaid Other | Admitting: Adult Health

## 2021-01-08 ENCOUNTER — Telehealth: Payer: Self-pay

## 2021-01-08 NOTE — Telephone Encounter (Signed)
Pt contacted [facility], educational by phone to inquire about updating current [facility] status per the request of [facility].  Pt was interviewed regarding program requirements.  Client was found to complete requirements successfully.     Current Chief Medical Complaint: Need to establish care  to assist with management medical current/past medical condition  All required documents were reviewed to have to submit at [facility]  RENEWAL enrollment appointment.    Appt was scheduled for (Fri) January 09, 2020 at 9am   A reminder text message was sent with appointment information and required documents to bring to appointment  Pt stated understanding and call ended

## 2021-01-11 ENCOUNTER — Telehealth: Payer: Self-pay

## 2021-01-11 NOTE — Telephone Encounter (Signed)
Client renewed today with [facility] and would like to re-establish medical care with [facility]. Appointment set for 01/29/21 at 10am. At client's request texted client that appointment information along with [facility] policy for cancellation at least 24 hours prior to appointment.  Plan:  Follow up with client after her appointment. Referral by D. Leavy Cella of care connect to "[employer], strong communities"   Francee Nodal RN  [employer]

## 2021-01-23 ENCOUNTER — Ambulatory Visit (INDEPENDENT_AMBULATORY_CARE_PROVIDER_SITE_OTHER): Payer: Self-pay | Admitting: Gastroenterology

## 2021-01-23 ENCOUNTER — Encounter: Payer: Self-pay | Admitting: Gastroenterology

## 2021-01-23 ENCOUNTER — Other Ambulatory Visit: Payer: Self-pay

## 2021-01-23 VITALS — BP 125/90 | HR 80 | Temp 97.7°F | Ht 64.5 in | Wt 209.8 lb

## 2021-01-23 DIAGNOSIS — R1310 Dysphagia, unspecified: Secondary | ICD-10-CM

## 2021-01-23 DIAGNOSIS — K219 Gastro-esophageal reflux disease without esophagitis: Secondary | ICD-10-CM

## 2021-01-23 HISTORY — DX: Dysphagia, unspecified: R13.10

## 2021-01-23 MED ORDER — OMEPRAZOLE 40 MG PO CPDR: DELAYED_RELEASE_CAPSULE | ORAL | 1 refills | Status: AC

## 2021-01-23 NOTE — Patient Instructions (Addendum)
[facility] only covers omeprazole. I have sent in omeprazole 40mg  to take one dose before breakfast and if you need second dose you can take it before supper.  Call if you have problems getting medication or if it doesn't help. We will see you back in 3 months.     Gastroesophageal Reflux Disease, Adult Gastroesophageal reflux (GER) happens when acid from the stomach flows up into the tube that connects the mouth and the stomach (esophagus). Normally, food travels down the esophagus and stays in the stomach to be digested. However, when a person has GER, food and stomach acid sometimes move back up into the esophagus. If this becomes a more serious problem, the person may be diagnosed with a disease called gastroesophageal reflux disease (GERD). GERD occurs when the reflux: Happens often. Causes frequent or severe symptoms. Causes problems such as damage to the esophagus. When stomach acid comes in contact with the esophagus, the acid may cause inflammation in the esophagus. Over time, GERD may create small holes (ulcers) in the lining of the esophagus. What are the causes? This condition is caused by a problem with the muscle between the esophagus and the stomach (lower esophageal sphincter, or LES). Normally, the LES muscle closes after food passes through the esophagus to the stomach. When the LES is weakened or abnormal, it does not close properly, and that allows food and stomach acid to go back up into theesophagus. The LES can be weakened by certain dietary substances, medicines, and medical conditions, including: Tobacco use. Pregnancy. Having a hiatal hernia. Alcohol use. Certain foods and beverages, such as coffee, chocolate, onions, and peppermint. What increases the risk? You are more likely to develop this condition if you: Have an increased body weight. Have a connective tissue disorder. Take NSAIDs, such as ibuprofen. What are the signs or symptoms? Symptoms of this condition  include: Heartburn. Difficult or painful swallowing and the feeling of having a lump in the throat. A bitter taste in the mouth. Bad breath and having a large amount of saliva. Having an upset or bloated stomach and belching. Chest pain. Different conditions can cause chest pain. Make sure you see your health care provider if you experience chest pain. Shortness of breath or wheezing. Ongoing (chronic) cough or a nighttime cough. Wearing away of tooth enamel. Weight loss. How is this diagnosed? This condition may be diagnosed based on a medical history and a physical exam. To determine if you have mild or severe GERD, your health care provider may also monitor how you respond to treatment. You may also have tests, including: A test to examine your stomach and esophagus with a small camera (endoscopy). A test that measures the acidity level in your esophagus. A test that measures how much pressure is on your esophagus. A barium swallow or modified barium swallow test to show the shape, size, and functioning of your esophagus. How is this treated? Treatment for this condition may vary depending on how severe your symptoms are. Your health care provider may recommend: Changes to your diet. Medicine. Surgery. The goal of treatment is to help relieve your symptoms and to preventcomplications. Follow these instructions at home: Eating and drinking  Follow a diet as recommended by your health care provider. This may involve avoiding foods and drinks such as: Coffee and tea, with or without caffeine. Drinks that contain alcohol. Energy drinks and sports drinks. Carbonated drinks or sodas. Chocolate and cocoa. Peppermint and mint flavorings. Garlic and onions. Horseradish. Spicy and acidic foods,  including peppers, chili powder, curry powder, vinegar, hot sauces, and barbecue sauce. Citrus fruit juices and citrus fruits, such as oranges, lemons, and limes. Tomato-based foods, such as red  sauce, chili, salsa, and pizza with red sauce. Fried and fatty foods, such as donuts, french fries, potato chips, and high-fat dressings. High-fat meats, such as hot dogs and fatty cuts of red and white meats, such as rib eye steak, sausage, ham, and bacon. High-fat dairy items, such as whole milk, butter, and cream cheese. Eat small, frequent meals instead of large meals. Avoid drinking large amounts of liquid with your meals. Avoid eating meals during the 2-3 hours before bedtime. Avoid lying down right after you eat. Do not exercise right after you eat.  Lifestyle  Do not use any products that contain nicotine or tobacco. These products include cigarettes, chewing tobacco, and vaping devices, such as e-cigarettes. If you need help quitting, ask your health care provider. Try to reduce your stress by using methods such as yoga or meditation. If you need help reducing stress, ask your health care provider. If you are overweight, reduce your weight to an amount that is healthy for you. Ask your health care provider for guidance about a safe weight loss goal.  General instructions Pay attention to any changes in your symptoms. Take over-the-counter and prescription medicines only as told by your health care provider. Do not take aspirin, ibuprofen, or other NSAIDs unless your health care provider told you to take these medicines. Wear loose-fitting clothing. Do not wear anything tight around your waist that causes pressure on your abdomen. Raise (elevate) the head of your bed about 6 inches (15 cm). You can use a wedge to do this. Avoid bending over if this makes your symptoms worse. Keep all follow-up visits. This is important. Contact a health care provider if: You have: New symptoms. Unexplained weight loss. Difficulty swallowing or it hurts to swallow. Wheezing or a persistent cough. A hoarse voice. Your symptoms do not improve with treatment. Get help right away if: You have  sudden pain in your arms, neck, jaw, teeth, or back. You suddenly feel sweaty, dizzy, or light-headed. You have chest pain or shortness of breath. You vomit and the vomit is green, yellow, or black, or it looks like blood or coffee grounds. You faint. You have stool that is red, bloody, or black. You cannot swallow, drink, or eat. These symptoms may represent a serious problem that is an emergency. Do not wait to see if the symptoms will go away. Get medical help right away. Call your local emergency services (911 in the [place] Do not drive yourself to the hospital. Summary Gastroesophageal reflux happens when acid from the stomach flows up into the esophagus. GERD is a disease in which the reflux happens often, causes frequent or severe symptoms, or causes problems such as damage to the esophagus. Treatment for this condition may vary depending on how severe your symptoms are. Your health care provider may recommend diet and lifestyle changes, medicine, or surgery. Contact a health care provider if you have new or worsening symptoms. Take over-the-counter and prescription medicines only as told by your health care provider. Do not take aspirin, ibuprofen, or other NSAIDs unless your health care provider told you to do so. Keep all follow-up visits as told by your health care provider. This is important. This information is not intended to replace advice given to you by your health care provider. Make sure you discuss any questions you have  with your healthcare provider. Document Revised: 12/19/2019 Document Reviewed: 12/19/2019 Elsevier Patient Education  2022 [employer].   Food Choices for Gastroesophageal Reflux Disease, Adult When you have gastroesophageal reflux disease (GERD), the foods you eat and your eating habits are very important. Choosing the right foods can help ease the discomfort of GERD. Consider working with a dietitian to help you [employer] choices. What are tips for  following this plan? Reading food labels Look for foods that are low in saturated fat. Foods that have less than 5% of daily value (DV) of fat and 0 g of trans fats may help with your symptoms. Cooking Cook foods using methods other than frying. This may include baking, steaming, grilling, or broiling. These are all methods that do not need a lot of fat for cooking. To add flavor, try to use herbs that are low in spice and acidity. Meal planning  Choose healthy foods that are low in fat, such as fruits, vegetables, whole grains, low-fat dairy products, lean meats, fish, and poultry. Eat frequent, small meals instead of three large meals each day. Eat your meals slowly, in a relaxed setting. Avoid bending over or lying down until 2-3 hours after eating. Limit high-fat foods such as fatty meats or fried foods. Limit your intake of fatty foods, such as oils, butter, and shortening. Avoid the following as told by your health care provider: Foods that cause symptoms. These may be different for different people. Keep a food diary to keep track of foods that cause symptoms. Alcohol. Drinking large amounts of liquid with meals. Eating meals during the 2-3 hours before bed.  Lifestyle Maintain a healthy weight. Ask your health care provider what weight is healthy for you. If you need to lose weight, work with your health care provider to do so safely. Exercise for at least 30 minutes on 5 or more days each week, or as told by your health care provider. Avoid wearing clothes that fit tightly around your waist and chest. Do not use any products that contain nicotine or tobacco. These products include cigarettes, chewing tobacco, and vaping devices, such as e-cigarettes. If you need help quitting, ask your health care provider. Sleep with the head of your bed raised. Use a wedge under the mattress or blocks under the bed frame to raise the head of the bed. Chew sugar-free gum after mealtimes. What foods  should I eat?  Eat a healthy, well-balanced diet of fruits, vegetables, whole grains, low-fat dairy products, lean meats, fish, and poultry. Each person is different. Foods that may trigger symptoms in one person may not trigger any symptoms in another person. Work with your health care provider to identify foods that are safe foryou. The items listed above may not be a complete list of recommended foods and beverages. Contact a dietitian for more information. What foods should I avoid? Limiting some of these foods may help manage the symptoms of GERD. Everyone is different. Consult a dietitian or your health care provider to help youidentify the exact foods to avoid, if any. Fruits Any fruits prepared with added fat. Any fruits that cause symptoms. For some people this may include citrus fruits, such as oranges, grapefruit, pineapple,and lemons. Vegetables Deep-fried vegetables. [place] fries. Any vegetables prepared with added fat. Any vegetables that cause symptoms. For some people, this may include tomatoesand tomato products, chili peppers, onions and garlic, and horseradish. Grains Pastries or quick breads with added fat. Meats and other proteins High-fat meats, such as fatty  beef or pork, hot dogs, ribs, ham, sausage, salami, and bacon. Fried meat or protein, including fried fish and friedchicken. Nuts and nut butters, in large amounts. Dairy Whole milk and chocolate milk. Sour cream. Cream. Ice cream. Cream cheese.Milkshakes. Fats and oils Butter. Margarine. Shortening. Ghee. Beverages Coffee and tea, with or without caffeine. Carbonated beverages. Sodas. Energy drinks. Fruit juice made with acidic fruits, such as orange or grapefruit.Tomato juice. Alcoholic drinks. Sweets and desserts Chocolate and cocoa. Donuts. Seasonings and condiments Pepper. Peppermint and spearmint. Added salt. Any condiments, herbs, or seasonings that cause symptoms. For some people, this may include curry,  hotsauce, or vinegar-based salad dressings. The items listed above may not be a complete list of foods and beverages to avoid. Contact a dietitian for more information. Questions to ask your health care provider Diet and lifestyle changes are usually the first steps that are taken to manage symptoms of GERD. If diet and lifestyle changes do not improve your symptoms,talk with your health care provider about taking medicines. Where to find more information International Foundation for Gastrointestinal Disorders: aboutgerd.org Summary When you have gastroesophageal reflux disease (GERD), food and lifestyle choices may be very helpful in easing the discomfort of GERD. Eat frequent, small meals instead of three large meals each day. Eat your meals slowly, in a relaxed setting. Avoid bending over or lying down until 2-3 hours after eating. Limit high-fat foods such as fatty meats or fried foods. This information is not intended to replace advice given to you by your health care provider. Make sure you discuss any questions you have with your healthcare provider. Document Revised: 12/19/2019 Document Reviewed: 12/19/2019 Elsevier Patient Education  2022 [employer]

## 2021-01-23 NOTE — Progress Notes (Signed)
Primary Care Physician: Pcp, No  Primary Gastroenterologist:  Roetta Sessions, MD   Chief Complaint  Patient presents with   Gastroesophageal Reflux    Hurting and burning in esophagus when eating or drinking    HPI: Dana Lane is a 25 y.o. female here for follow-up of GERD and nausea. Last seen in 08/2020.   At last office visit she was switched from omeprazole 40mg  daily to Dexilant 60mg  daily due to breakthrough heartburn symptoms. Dexilant was not on the formulary, we were unaware that she was unable to receive medication.  Patient states that she used up the rest of her omeprazole and then began using antacid chews with flares. Symptoms worse the last month. No recent antibiotics. When eats/drinks burns as food/liquids go down.  No pain as long as she is not eating or drinking.  No dysphagia. No vomiting. No abdominal pain. BMs doing ok. No melena, brbpr.   EGD November 2020: - Normal esophagus. - Multiple gastric polyps. Resected and retrieved. Biopsied (fundic gland polyp). Abnormal gastric mucosa of doubtful clinical significance status post biopsy disease chronic inactive gastritis, no H. pylori) - Normal duodenal bulb and second portion of the  Colonoscopy November 2020: -Normal exam -Next colonoscopy at age 67 (initial screening age)  Current Outpatient Medications  Medication Sig Dispense Refill   Calcium Carbonate Antacid (ANTACID E-X PO) Take by mouth daily as needed.     Prenatal Vit-Fe Fumarate-FA (MULTIVITAMIN-PRENATAL) 27-0.8 MG TABS tablet Take 1 tablet by mouth daily at 12 noon.     No current facility-administered medications for this visit.    Allergies as of 01/23/2021 - Review Complete 01/23/2021  Allergen Reaction Noted   Poison oak extract [poison oak extract] Itching and Rash 01/09/2016   Chocolate Itching and Swelling 12/07/2014   Eggs or egg-derived products Diarrhea and Other (See Comments) 12/07/2014    ROS:  General: Negative for  anorexia, weight loss, fever, chills, fatigue, weakness. ENT: Negative for hoarseness, difficulty swallowing , nasal congestion. CV: Negative for chest pain, angina, palpitations, dyspnea on exertion, peripheral edema.  Respiratory: Negative for dyspnea at rest, dyspnea on exertion, cough, sputum, wheezing.  GI: See history of present illness. GU:  Negative for dysuria, hematuria, urinary incontinence, urinary frequency, nocturnal urination.  Endo: Negative for unusual weight change.    Physical Examination:   BP 125/90   Pulse 80   Temp 97.7 F (36.5 C) (Temporal)   Ht 5' 4.5" (1.638 m)   Wt 209 lb 12.8 oz (95.2 kg)   LMP 01/06/2021 (Exact Date)   BMI 35.46 kg/m   General: Well-nourished, well-developed in no acute distress.  Eyes: No icterus. Mouth: Oropharyngeal mucosa moist and pink , no lesions erythema or exudate.  No white patches. Lungs: Clear to auscultation bilaterally.  Heart: Regular rate and rhythm, no murmurs rubs or gallops.  Abdomen: Bowel sounds are normal, nontender, nondistended, no hepatosplenomegaly or masses, no abdominal bruits or hernia , no rebound or guarding.   Extremities: No lower extremity edema. No clubbing or deformities. Neuro: Alert and oriented x 4   Skin: Warm and dry, no jaundice.   Psych: Alert and cooperative, normal mood and affect.  Labs:  Labs from May 2022: White blood cell count 9400, hemoglobin 14.1, platelets 364,000.  Sodium 138, potassium 4.5, creatinine 0.88, albumin 4.1, total bilirubin 0.3, AST 17, ALT 27, alkaline phosphatase 83  Imaging Studies: No results found.   Assessment:  Pleasant 25 year old female with history of GERD, historically  has been difficult to manage.  Presents today with 1 month history of flare.  Over-the-counter antacids not helping.  She complains more burning with eating and drinking.  Somewhat different from her typical heartburn symptoms.  Denies any recent antibiotics or use of inhalers.  Denies  oral lesions or white plaques.  None seen on exam today.  Symptoms may be due to refractory GERD, reflux esophagitis but no improvement with PPI therapy she may need to have further evaluation.  Differential would also include Candida esophagitis less likely.  Less likely pill induced esophagitis or viral esophagitis given chronicity of her symptoms.   Plan: To receive the medication through [facility].  Options for PPIs or omeprazole.  Will increase dose to 40 mg before breakfast and additional dose before evening meal if needed.   Will call with update if symptoms do not settle down or she cannot obtain medication. Reinforced antireflux measures.

## 2021-01-29 ENCOUNTER — Other Ambulatory Visit: Payer: Self-pay

## 2021-01-29 ENCOUNTER — Ambulatory Visit: Payer: Medicaid Other | Admitting: Physician Assistant

## 2021-01-29 ENCOUNTER — Encounter: Payer: Self-pay | Admitting: Physician Assistant

## 2021-01-29 VITALS — BP 127/87 | HR 85 | Temp 98.2°F | Ht 65.0 in | Wt 209.0 lb

## 2021-01-29 DIAGNOSIS — K219 Gastro-esophageal reflux disease without esophagitis: Secondary | ICD-10-CM

## 2021-01-29 DIAGNOSIS — F418 Other specified anxiety disorders: Secondary | ICD-10-CM

## 2021-01-29 DIAGNOSIS — E669 Obesity, unspecified: Secondary | ICD-10-CM

## 2021-01-29 MED ORDER — BUSPIRONE HCL 10 MG PO TABS: 10.0000 mg | ORAL_TABLET | Freq: Two times a day (BID) | ORAL | 0 refills | Status: DC

## 2021-01-29 NOTE — Progress Notes (Signed)
BP 127/87   Pulse 85   Temp 98.2 F (36.8 C)   Ht 5\' 5"  (1.651 m)   Wt 209 lb (94.8 kg)   LMP 01/06/2021 (Exact Date)   SpO2 96%   BMI 34.78 kg/m    Subjective:    Patient ID: 01/08/2021, female    DOB: Jan 20, 1996, 25 y.o.   MRN: [id number]  HPI: Dana Lane is a 25 y.o. female presenting on 01/29/2021 for No chief complaint on file.   HPI   Pt had a negative covid 19 screening questionnaire.    Pt is 25yoF who presents to re-establish care.  Pt was last Seen here august 2021.  She then transferred to [facility] clinic.  She says she only went there for therapy.  She is not currently getting counseling anywhere.  She admits to still having depression.  She says she stopped going there for counseling because she doesn't know why.  She says Maybe she just didn't go to her last appointment and then she didn't reschedule.  She says that was more than 2 months ago.  She was signed up for [facility] program.  She says that she is supposed to get a call to schedule.  Pt says the counselor at [facility] clinic put her on medication but pt says it didn't really help.  She doesn't remember what it was called.  She took it only for 2 or 3 months.    She is "kindof" trying to get pregnant.  She does not work currently.  She is not married.  She has a boyfriend.      She sayse her reflux isn't good. She has been off her meds but is starting to get better now because she restarted her meds.  She saw GI specialist last week.  Screenings done today: PHQ-9 score 18 GAD-7 score 14 Pt denies SI, HI.    Relevant past medical, surgical, family and social history reviewed and updated as indicated. Interim medical history since our last visit reviewed. Allergies and medications reviewed and updated.  CURRENT MEDS Omeprazole Prenatal vitamins Calcium carbonate antacid   Review of Systems  Per HPI unless specifically indicated above     Objective:    BP 127/87   Pulse 85    Temp 98.2 F (36.8 C)   Ht 5\' 5"  (1.651 m)   Wt 209 lb (94.8 kg)   LMP 01/06/2021 (Exact Date)   SpO2 96%   BMI 34.78 kg/m   Wt Readings from Last 3 Encounters:  01/29/21 209 lb (94.8 kg)  01/23/21 209 lb 12.8 oz (95.2 kg)  09/24/20 214 lb (97.1 kg)    Physical Exam Vitals reviewed.  Constitutional:      General: She is not in acute distress.    Appearance: She is well-developed. She is obese. She is not ill-appearing.  HENT:     Head: Normocephalic and atraumatic.  Cardiovascular:     Rate and Rhythm: Normal rate and regular rhythm.  Pulmonary:     Effort: Pulmonary effort is normal.     Breath sounds: Normal breath sounds.  Abdominal:     General: Bowel sounds are normal.     Palpations: Abdomen is soft. There is no mass.     Tenderness: There is no abdominal tenderness.  Musculoskeletal:     Cervical back: Neck supple.     Right lower leg: No edema.     Left lower leg: No edema.  Lymphadenopathy:  Cervical: No cervical adenopathy.  Skin:    General: Skin is warm and dry.  Neurological:     Mental Status: She is alert and oriented to person, place, and time.     Gait: Gait is intact.  Psychiatric:        Attention and Perception: Attention normal.        Mood and Affect: Affect is flat.        Speech: Speech normal.        Behavior: Behavior normal. Behavior is cooperative.     Comments: Pt at baseline           Assessment & Plan:    Encounter Diagnoses  Name Primary?   Gastroesophageal reflux disease, unspecified whether esophagitis present Yes   Obesity, unspecified classification, unspecified obesity type, unspecified whether serious comorbidity present    Depression with anxiety      -Trial of buspar -Pt to do counseling with [facility] -Pt to follow up 3 weeks.  She is to contact office sooner prn

## 2021-01-30 ENCOUNTER — Telehealth: Payer: Self-pay

## 2021-01-30 NOTE — Telephone Encounter (Signed)
Attempted call for follow up after [facility] appointment on 01/29/21 no answer, left voicemail requesting return call.   Francee Nodal RN [employer]

## 2021-01-30 NOTE — Telephone Encounter (Signed)
Client returned call. States [facility] with [facility] went well. She was started on some medications for depression. She states she saw GI and they tried to send in medications to [facility] and was told she was not current. Upon review on [facility] renewal in July 22, [facility] application was not completed. Contacted [facility] and client's [facility] expired 01/21/21.  Will call client back, [facility] application will need to be signed and completed to renew [facility]. Client will come to [facility] office to fill out application for [facility] today.  Francee Nodal RN [employer]

## 2021-01-31 ENCOUNTER — Telehealth: Payer: Self-pay

## 2021-01-31 ENCOUNTER — Telehealth: Payer: Self-pay | Admitting: Physician Assistant

## 2021-01-31 NOTE — Telephone Encounter (Signed)
Attempted to call client for check in and follow up.  No answer, left VM.  Francee Nodal RN [employer]

## 2021-01-31 NOTE — Telephone Encounter (Signed)
Received call from Melissa at [facility].  She reported that Dana Lane had reported having suicidal thoughts.   Melissa stated that Dana Lane might or might not get counseling at the [facility] program, depending on which group she was put into (it's some sort of research program).    Attempted to call Dana Lane to make sure that she goes to somewhere that she will definitely get treatment, like [facility].  She did not answer so voicemail was left asking her to call this office.    Dana Lane denied SI when she was in the office on 02/28/21.

## 2021-02-05 ENCOUNTER — Telehealth: Payer: Self-pay

## 2021-02-05 NOTE — Telephone Encounter (Signed)
Called client to follow up regarding assistance with accessing Mental health services and check in call.  Previously Client was referred to [facility] for [facility] services only and was last seen 06/12/20 with Dr Register.  Client denies suicidal thoughts today, however admits to thoughts last on Saturday 02/02/21, no plan and reports anchors for not having a plan or plan to follow through due to her nephews.  Discussed options for Mental Health Services [facility] center she could re-establish there and discussed co-payments for therapy   [facility] is another options and there is normally federal funding for uninsured. Discussed that therapy would possibly not be one on one as at [facility]. Discussed that [facility] is walk in intakes only to establish care Monday -Friday beginning at 8am. Recommend client to go early to complete intake. Client reports understanding.  Texted client Crisis numbers Shelly Coss [phone] crisis line [facility] mobile crisis line [phone] [facility] number [phone] Also provided address of [facility] [address] [place], [place] [address]  Also discussed with client that provider at [facility] had been trying to reach her. Encouraged client to call [facility]/06[facility]21 early 8 am to reach the provider. Client reported understanding.  Plan: Follow up with client next week to determine if client has began establishing Mental Health services with [facility].Francee Nodal RN [facility]

## 2021-02-06 ENCOUNTER — Telehealth: Payer: Self-pay | Admitting: Physician Assistant

## 2021-02-06 NOTE — Telephone Encounter (Signed)
Pt returned call from last week.  Discussed with her that [facility] program is okay but that it is a [employer] fast streamer and will not ensure that she gets counseling.  Discussed options for MH care including [facility] in [place] and reviewed walk-in that is available at that facility.   She says that Saturday was the last day that she felt like hurting herself.  She discussed options yesterday on phone with Norval Gable with [facility].  Pt says she feels like the buspar that was started at her appointment is helping some.   Pt agrees to contact a facility (either [facility] or one that was discussed with her yesterday) to get some counseling.

## 2021-02-19 ENCOUNTER — Ambulatory Visit: Payer: Medicaid Other | Admitting: Physician Assistant

## 2021-02-19 ENCOUNTER — Encounter: Payer: Self-pay | Admitting: Physician Assistant

## 2021-02-19 ENCOUNTER — Other Ambulatory Visit: Payer: Self-pay

## 2021-02-19 VITALS — BP 119/78 | HR 83 | Temp 98.0°F | Wt 211.0 lb

## 2021-02-19 DIAGNOSIS — M25561 Pain in right knee: Secondary | ICD-10-CM

## 2021-02-19 DIAGNOSIS — F418 Other specified anxiety disorders: Secondary | ICD-10-CM

## 2021-02-19 MED ORDER — BUSPIRONE HCL 15 MG PO TABS: 15.0000 mg | ORAL_TABLET | Freq: Two times a day (BID) | ORAL | 0 refills | Status: AC
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
## 2021-02-27 ENCOUNTER — Ambulatory Visit (HOSPITAL_COMMUNITY)
Admission: RE | Admit: 2021-02-27 | Discharge: 2021-02-27 | Disposition: A | Discharge status: Home / Self Care | Payer: Medicaid Other | Source: Ambulatory Visit | Attending: Physician Assistant | Admitting: Physician Assistant

## 2021-02-27 ENCOUNTER — Other Ambulatory Visit: Payer: Self-pay

## 2021-02-27 DIAGNOSIS — M25561 Pain in right knee: Secondary | ICD-10-CM | POA: Insufficient documentation

## 2021-03-05 ENCOUNTER — Other Ambulatory Visit: Payer: Self-pay | Admitting: Physician Assistant

## 2021-03-06 ENCOUNTER — Telehealth: Payer: Self-pay

## 2021-03-06 NOTE — Telephone Encounter (Signed)
Returned call to pt regarding Buspar refill, informed of refill sent at 8/30 appt, pt stated she has already picked up.

## 2021-03-19 ENCOUNTER — Ambulatory Visit: Payer: Medicaid Other | Admitting: Physician Assistant

## 2021-03-27 ENCOUNTER — Ambulatory Visit: Payer: Medicaid Other | Admitting: Physician Assistant

## 2021-04-23 ENCOUNTER — Telehealth: Payer: Self-pay | Admitting: Licensed Clinical Social Worker

## 2021-04-23 NOTE — Telephone Encounter (Signed)
[facility] attempted phone call to patient to inquire if patient was interested in counseling services through clinic, patient did not answer, voicemail was left.

## 2021-05-01 ENCOUNTER — Telehealth: Payer: Self-pay | Admitting: Licensed Clinical Social Worker

## 2021-05-01 NOTE — Telephone Encounter (Signed)
[facility] reached patient via phone call, first [facility] appointment was scheduled for 3 pm on 12/7.

## 2021-05-22 ENCOUNTER — Other Ambulatory Visit: Payer: Self-pay

## 2021-05-22 ENCOUNTER — Encounter: Payer: Self-pay | Admitting: Gastroenterology

## 2021-05-22 ENCOUNTER — Ambulatory Visit (INDEPENDENT_AMBULATORY_CARE_PROVIDER_SITE_OTHER): Payer: Self-pay | Admitting: Gastroenterology

## 2021-05-22 VITALS — BP 122/80 | HR 76 | Temp 97.1°F | Ht 64.0 in | Wt 214.6 lb

## 2021-05-22 DIAGNOSIS — K760 Fatty (change of) liver, not elsewhere classified: Secondary | ICD-10-CM

## 2021-05-22 DIAGNOSIS — K219 Gastro-esophageal reflux disease without esophagitis: Secondary | ICD-10-CM

## 2021-05-22 NOTE — Progress Notes (Signed)
Primary Care Physician: Jacquelin Hawking, PA-C  Primary Gastroenterologist:  Roetta Sessions, MD   Chief Complaint  Patient presents with   Gastroesophageal Reflux    Doing ok    HPI: Dana Lane is a 25 y.o. female here for follow-up.  Last seen in August 2022.  History of GERD, historically has been difficult to manage.  Overall she is doing fairly well at this time.  Every couple of weeks she has some breakthrough heartburn and regurgitation.  Taking omeprazole twice a day over the past 4 months.  Occasional nonspecific dysphagia.  No abdominal pain.  No vomiting.  Bowel movements are regular.  No blood in the stool or melena.  Denies NSAID or aspirin use.  EGD November 2020: - Normal esophagus. - Multiple gastric polyps. Resected and retrieved. Biopsied (fundic gland polyp). Abnormal gastric mucosa of doubtful clinical significance status post biopsy disease chronic inactive gastritis, no H. pylori) - Normal duodenal bulb and second portion of the   Colonoscopy November 2020: -Normal exam -Next colonoscopy at age 86 (initial screening age)   Current Outpatient Medications  Medication Sig Dispense Refill   busPIRone (BUSPAR) 15 MG tablet Take 1 tablet (15 mg total) by mouth 2 (two) times daily. 60 tablet 0   Calcium Carbonate Antacid (ANTACID E-X PO) Take by mouth daily as needed.     omeprazole (PRILOSEC) 40 MG capsule Take 40mg  daily before breakfast and take additional dose before supper if needed. 180 capsule 1   Prenatal Vit-Fe Fumarate-FA (MULTIVITAMIN-PRENATAL) 27-0.8 MG TABS tablet Take 1 tablet by mouth daily at 12 noon.     No current facility-administered medications for this visit.    Allergies as of 05/22/2021 - Review Complete 05/22/2021  Allergen Reaction Noted   Poison oak extract [poison oak extract] Itching and Rash 01/09/2016   Chocolate Itching and Swelling 12/07/2014   Eggs or egg-derived products Diarrhea and Other (See Comments) 12/07/2014     ROS:  General: Negative for anorexia, weight loss, fever, chills, fatigue, weakness. ENT: Negative for hoarseness, difficulty swallowing , nasal congestion. CV: Negative for chest pain, angina, palpitations, dyspnea on exertion, peripheral edema.  Respiratory: Negative for dyspnea at rest, dyspnea on exertion, cough, sputum, wheezing.  GI: See history of present illness. GU:  Negative for dysuria, hematuria, urinary incontinence, urinary frequency, nocturnal urination.  Endo: Negative for unusual weight change.    Physical Examination:   BP 122/80   Pulse 76   Temp (!) 97.1 F (36.2 C) (Temporal)   Ht 5\' 4"  (1.626 m)   Wt 214 lb 9.6 oz (97.3 kg)   LMP 05/13/2021 (Approximate)   BMI 36.84 kg/m   General: Well-nourished, well-developed in no acute distress.  Eyes: No icterus. Mouth: masked.  Abdomen: Bowel sounds are normal, nontender, nondistended, no hepatosplenomegaly or masses, no abdominal bruits or hernia , no rebound or guarding.   Extremities: No lower extremity edema. No clubbing or deformities. Neuro: Alert and oriented x 4   Skin: Warm and dry, no jaundice.   Psych: Alert and cooperative, normal mood and affect.  Labs:  Lab Results  Component Value Date   ALT 31 05/23/2019   AST 20 05/23/2019   ALKPHOS 70 05/23/2019   BILITOT 0.3 05/23/2019   Labs from May 2022: Total bilirubin 0.3, alkaline phosphatase 83, AST 17, ALT 27.   Imaging Studies: No results found.   Assessment:  GERD: Doing much better.  We will try to decrease her back to once daily  dosing.  She will slowly wean her evening dose over the next couple of weeks if tolerated.  She will call let us know if she is not able to reduce dose to once daily.  Reinforced antireflux measures.  Encouraged 10 pound weight loss over the next 3 months.  Fatty liver: Noted on prior ultrasound.  LFTs normal.  Encouraged slow gradual weight loss, increase physical activities.   Plan: Try decreasing  omeprazole to once daily before breakfast to see if you tolerate lower dose. Wean evening dose over next two weeks. If she does not tolerate once daily dosing, she will call and let us know.  Continue to watch your weight. Goal of losing 10 pounds in the next 3 months. Try to get 20-30 minutes of walking in 5 days a week.  OV in one year or sooner if needed.

## 2021-05-22 NOTE — Patient Instructions (Addendum)
Try decreasing your omeprazole to once daily before breakfast to see if you tolerate lower dose. You will need to slowly wean your evening dose. Start out take evening dose every other day. If tolerated then drop back to three times a week. If tolerate then you can stop the evening dose.  If you do not tolerate dropping back to once daily on your omeprazole, please call and let us know.  Continue to watch your weight. Goal of losing 10 pounds in the next 3 months. This will help your reflux and fatty liver. Try to get 20-30 minutes of walking in 5 days a week.  We will see you back in one year or call sooner if needed.   Food Choices for Gastroesophageal Reflux Disease, Adult When you have gastroesophageal reflux disease (GERD), the foods you eat and your eating habits are very important. Choosing the right foods can help ease the discomfort of GERD. Consider working with a dietitian to help you make healthy food choices. What are tips for following this plan? Reading food labels Look for foods that are low in saturated fat. Foods that have less than 5% of daily value (DV) of fat and 0 g of trans fats may help with your symptoms. Cooking Cook foods using methods other than frying. This may include baking, steaming, grilling, or broiling. These are all methods that do not need a lot of fat for cooking. To add flavor, try to use herbs that are low in spice and acidity. Meal planning  Choose healthy foods that are low in fat, such as fruits, vegetables, whole grains, low-fat dairy products, lean meats, fish, and poultry. Eat frequent, small meals instead of three large meals each day. Eat your meals slowly, in a relaxed setting. Avoid bending over or lying down until 2-3 hours after eating. Limit high-fat foods such as fatty meats or fried foods. Limit your intake of fatty foods, such as oils, butter, and shortening. Avoid the following as told by your health care provider: Foods that cause  symptoms. These may be different for different people. Keep a food diary to keep track of foods that cause symptoms. Alcohol. Drinking large amounts of liquid with meals. Eating meals during the 2-3 hours before bed. Lifestyle Maintain a healthy weight. Ask your health care provider what weight is healthy for you. If you need to lose weight, work with your health care provider to do so safely. Exercise for at least 30 minutes on 5 or more days each week, or as told by your health care provider. Avoid wearing clothes that fit tightly around your waist and chest. Do not use any products that contain nicotine or tobacco. These products include cigarettes, chewing tobacco, and vaping devices, such as e-cigarettes. If you need help quitting, ask your health care provider. Sleep with the head of your bed raised. Use a wedge under the mattress or blocks under the bed frame to raise the head of the bed. Chew sugar-free gum after mealtimes. What foods should I eat? Eat a healthy, well-balanced diet of fruits, vegetables, whole grains, low-fat dairy products, lean meats, fish, and poultry. Each person is different. Foods that may trigger symptoms in one person may not trigger any symptoms in another person. Work with your health care provider to identify foods that are safe for you. The items listed above may not be a complete list of recommended foods and beverages. Contact a dietitian for more information. What foods should I avoid? Limiting some of these  foods may help manage the symptoms of GERD. Everyone is different. Consult a dietitian or your health care provider to help you identify the exact foods to avoid, if any. Fruits Any fruits prepared with added fat. Any fruits that cause symptoms. For some people this may include citrus fruits, such as oranges, grapefruit, pineapple, and lemons. Vegetables Deep-fried vegetables. [place] fries. Any vegetables prepared with added fat. Any vegetables that  cause symptoms. For some people, this may include tomatoes and tomato products, chili peppers, onions and garlic, and horseradish. Grains Pastries or quick breads with added fat. Meats and other proteins High-fat meats, such as fatty beef or pork, hot dogs, ribs, ham, sausage, salami, and bacon. Fried meat or protein, including fried fish and fried chicken. Nuts and nut butters, in large amounts. Dairy Whole milk and chocolate milk. Sour cream. Cream. Ice cream. Cream cheese. Milkshakes. Fats and oils Butter. Margarine. Shortening. Ghee. Beverages Coffee and tea, with or without caffeine. Carbonated beverages. Sodas. Energy drinks. Fruit juice made with acidic fruits, such as orange or grapefruit. Tomato juice. Alcoholic drinks. Sweets and desserts Chocolate and cocoa. Donuts. Seasonings and condiments Pepper. Peppermint and spearmint. Added salt. Any condiments, herbs, or seasonings that cause symptoms. For some people, this may include curry, hot sauce, or vinegar-based salad dressings. The items listed above may not be a complete list of foods and beverages to avoid. Contact a dietitian for more information. Questions to ask your health care provider Diet and lifestyle changes are usually the first steps that are taken to manage symptoms of GERD. If diet and lifestyle changes do not improve your symptoms, talk with your health care provider about taking medicines. Where to find more information [employer] G[employer]intestinal [employer]: aboutgerd.org Summary When you have gastroesophageal reflux disease (GERD), food and lifestyle choices may be very helpful in easing the discomfort of GERD. Eat frequent, small meals instead of three large meals each day. Eat your meals slowly, in a relaxed setting. Avoid bending over or lying down until 2-3 hours after eating. Limit high-fat foods such as fatty meats or fried foods. This information is not intended to replace advice given to  you by your health care provider. Make sure you discuss any questions you have with your health care provider. Document Revised: 12/19/2019 Document Reviewed: 12/19/2019 Elsevier Patient Education  2022 [employer]  Gastroesophageal Reflux Disease, Adult Gastroesophageal reflux (GER) happens when acid from the stomach flows up into the tube that connects the mouth and the stomach (esophagus). Normally, food travels down the esophagus and stays in the stomach to be digested. However, when a person has GER, food and stomach acid sometimes move back up into the esophagus. If this becomes a more serious problem, the person may be diagnosed with a disease called gastroesophageal reflux disease (GERD). GERD occurs when the reflux: Happens often. Causes frequent or severe symptoms. Causes problems such as damage to the esophagus. When stomach acid comes in contact with the esophagus, the acid may cause inflammation in the esophagus. Over time, GERD may create small holes (ulcers) in the lining of the esophagus. What are the causes? This condition is caused by a problem with the muscle between the esophagus and the stomach (lower esophageal sphincter, or LES). Normally, the LES muscle closes after food passes through the esophagus to the stomach. When the LES is weakened or abnormal, it does not close properly, and that allows food and stomach acid to go back up into the esophagus. The LES can  be weakened by certain dietary substances, medicines, and medical conditions, including: Tobacco use. Pregnancy. Having a hiatal hernia. Alcohol use. Certain foods and beverages, such as coffee, chocolate, onions, and peppermint. What increases the risk? You are more likely to develop this condition if you: Have an increased body weight. Have a connective tissue disorder. Take NSAIDs, such as ibuprofen. What are the signs or symptoms? Symptoms of this condition include: Heartburn. Difficult or painful  swallowing and the feeling of having a lump in the throat. A bitter taste in the mouth. Bad breath and having a large amount of saliva. Having an upset or bloated stomach and belching. Chest pain. Different conditions can cause chest pain. Make sure you see your health care provider if you experience chest pain. Shortness of breath or wheezing. Ongoing (chronic) cough or a nighttime cough. Wearing away of tooth enamel. Weight loss. How is this diagnosed? This condition may be diagnosed based on a medical history and a physical exam. To determine if you have mild or severe GERD, your health care provider may also monitor how you respond to treatment. You may also have tests, including: A test to examine your stomach and esophagus with a small camera (endoscopy). A test that measures the acidity level in your esophagus. A test that measures how much pressure is on your esophagus. A barium swallow or modified barium swallow test to show the shape, size, and functioning of your esophagus. How is this treated? Treatment for this condition may vary depending on how severe your symptoms are. Your health care provider may recommend: Changes to your diet. Medicine. Surgery. The goal of treatment is to help relieve your symptoms and to prevent complications. Follow these instructions at home: Eating and drinking  Follow a diet as recommended by your health care provider. This may involve avoiding foods and drinks such as: Coffee and tea, with or without caffeine. Drinks that contain alcohol. Energy drinks and sports drinks. Carbonated drinks or sodas. Chocolate and cocoa. Peppermint and mint flavorings. Garlic and onions. Horseradish. Spicy and acidic foods, including peppers, chili powder, curry powder, vinegar, hot sauces, and barbecue sauce. Citrus fruit juices and citrus fruits, such as oranges, lemons, and limes. Tomato-based foods, such as red sauce, chili, salsa, and pizza with red  sauce. Fried and fatty foods, such as donuts, french fries, potato chips, and high-fat dressings. High-fat meats, such as hot dogs and fatty cuts of red and white meats, such as rib eye steak, sausage, ham, and bacon. High-fat dairy items, such as whole milk, butter, and cream cheese. Eat small, frequent meals instead of large meals. Avoid drinking large amounts of liquid with your meals. Avoid eating meals during the 2-3 hours before bedtime. Avoid lying down right after you eat. Do not exercise right after you eat. Lifestyle  Do not use any products that contain nicotine or tobacco. These products include cigarettes, chewing tobacco, and vaping devices, such as e-cigarettes. If you need help quitting, ask your health care provider. Try to reduce your stress by using methods such as yoga or meditation. If you need help reducing stress, ask your health care provider. If you are overweight, reduce your weight to an amount that is healthy for you. Ask your health care provider for guidance about a safe weight loss goal. General instructions Pay attention to any changes in your symptoms. Take over-the-counter and prescription medicines only as told by your health care provider. Do not take aspirin, ibuprofen, or other NSAIDs unless your health care  provider told you to take these medicines. Wear loose-fitting clothing. Do not wear anything tight around your waist that causes pressure on your abdomen. Raise (elevate) the head of your bed about 6 inches (15 cm). You can use a wedge to do this. Avoid bending over if this makes your symptoms worse. Keep all follow-up visits. This is important. Contact a health care provider if: You have: New symptoms. Unexplained weight loss. Difficulty swallowing or it hurts to swallow. Wheezing or a persistent cough. A hoarse voice. Your symptoms do not improve with treatment. Get help right away if: You have sudden pain in your arms, neck, jaw, teeth, or  back. You suddenly feel sweaty, dizzy, or light-headed. You have chest pain or shortness of breath. You vomit and the vomit is green, yellow, or black, or it looks like blood or coffee grounds. You faint. You have stool that is red, bloody, or black. You cannot swallow, drink, or eat. These symptoms may represent a serious problem that is an emergency. Do not wait to see if the symptoms will go away. Get medical help right away. Call your local emergency services (911 in the [place] Do not drive yourself to the hospital. Summary Gastroesophageal reflux happens when acid from the stomach flows up into the esophagus. GERD is a disease in which the reflux happens often, causes frequent or severe symptoms, or causes problems such as damage to the esophagus. Treatment for this condition may vary depending on how severe your symptoms are. Your health care provider may recommend diet and lifestyle changes, medicine, or surgery. Contact a health care provider if you have new or worsening symptoms. Take over-the-counter and prescription medicines only as told by your health care provider. Do not take aspirin, ibuprofen, or other NSAIDs unless your health care provider told you to do so. Keep all follow-up visits as told by your health care provider. This is important. This information is not intended to replace advice given to you by your health care provider. Make sure you discuss any questions you have with your health care provider. Document Revised: 12/19/2019 Document Reviewed: 12/19/2019 Elsevier Patient Education  2022 Else[employer] Inc.  Fatty Liver Disease The liver converts food into energy, removes toxic material from the blood, makes important proteins, and absorbs necessary vitamins from food. Fatty liver disease occurs when too much fat has built up in your liver cells. Fatty liver disease is also called hepatic steatosis. In many cases, fatty liver disease does not cause symptoms or problems. It  is often diagnosed when tests are being done for other reasons. However, over time, fatty liver can cause inflammation that may lead to more serious liver problems, such as scarring of the liver (cirrhosis) and liver failure. Fatty liver is associated with insulin resistance, increased body fat, high blood pressure (hypertension), and high cholesterol. These are features of metabolic syndrome and increase your risk for stroke, diabetes, and heart disease. What are the causes? This condition may be caused by components of metabolic syndrome: Obesity. Insulin resistance. High cholesterol. Other causes: Alcohol abuse. Poor nutrition. Cushing syndrome. Pregnancy. Certain drugs. Poisons. Some viral infections. What increases the risk? You are more likely to develop this condition if you: Abuse alcohol. Are overweight. Have diabetes. Have hepatitis. Have a high triglyceride level. Are pregnant. What are the signs or symptoms? Fatty liver disease often does not cause symptoms. If symptoms do develop, they can include: Fatigue and weakness. Weight loss. Confusion. Nausea, vomiting, or abdominal pain. Yellowing of your skin  and the white parts of your eyes (jaundice). Itchy skin. How is this diagnosed? This condition may be diagnosed by: A physical exam and your medical history. Blood tests. Imaging tests, such as an ultrasound, CT scan, or MRI. A liver biopsy. A small sample of liver tissue is removed using a needle. The sample is then looked at under a microscope. How is this treated? Fatty liver disease is often caused by other health conditions. Treatment for fatty liver may involve medicines and lifestyle changes to manage conditions such as: Alcoholism. High cholesterol. Diabetes. Being overweight or obese. Follow these instructions at home:  Do not drink alcohol. If you have trouble quitting, ask your health care provider how to safely quit with the help of medicine or a  supervised program. This is important to keep your condition from getting worse. Eat a healthy diet as told by your health care provider. Ask your health care provider about working with a dietitian to develop an eating plan. Exercise regularly. This can help you lose weight and control your cholesterol and diabetes. Talk to your health care provider about an exercise plan and which activities are best for you. Take over-the-counter and prescription medicines only as told by your health care provider. Keep all follow-up visits. This is important. Contact a health care provider if: You have trouble controlling your: Blood sugar. This is especially important if you have diabetes. Cholesterol. Drinking of alcohol. Get help right away if: You have abdominal pain. You have jaundice. You have nausea and are vomiting. You vomit blood or material that looks like coffee grounds. You have stools that are black, tar-like, or bloody. Summary Fatty liver disease develops when too much fat builds up in the cells of your liver. Fatty liver disease often causes no symptoms or problems. However, over time, fatty liver can cause inflammation that may lead to more serious liver problems, such as scarring of the liver (cirrhosis). You are more likely to develop this condition if you abuse alcohol, are pregnant, are overweight, have diabetes, have hepatitis, or have high triglyceride or cholesterol levels. Contact your health care provider if you have trouble controlling your blood sugar, cholesterol, or drinking of alcohol. This information is not intended to replace advice given to you by your health care provider. Make sure you discuss any questions you have with your health care provider. Document Revised: 03/22/2020 Document Reviewed: 03/22/2020 Elsevier Patient Education  2022 [employer].

## 2021-05-29 ENCOUNTER — Other Ambulatory Visit: Payer: Self-pay

## 2021-05-29 ENCOUNTER — Ambulatory Visit: Payer: Medicaid Other | Admitting: Licensed Clinical Social Worker

## 2021-05-29 DIAGNOSIS — F419 Anxiety disorder, unspecified: Secondary | ICD-10-CM

## 2021-05-29 DIAGNOSIS — F32A Depression, unspecified: Secondary | ICD-10-CM

## 2021-05-29 NOTE — Progress Notes (Signed)
[facility] engaged patient in initial [facility] session. [facility] provided reflective listening and validation as patient shared about her depression, anxiety, and low self-esteem. [facility] led patient in progressive muscle relaxation, encouraged patient to utilize to stabilize mood.

## 2021-06-05 ENCOUNTER — Other Ambulatory Visit: Payer: Self-pay

## 2021-06-05 ENCOUNTER — Emergency Department (HOSPITAL_COMMUNITY)
Admission: EM | Admit: 2021-06-05 | Discharge: 2021-06-05 | Disposition: A | Discharge status: Home / Self Care | Payer: Medicaid Other | Attending: Emergency Medicine | Admitting: Emergency Medicine

## 2021-06-05 ENCOUNTER — Encounter (HOSPITAL_COMMUNITY): Payer: Self-pay

## 2021-06-05 DIAGNOSIS — R1013 Epigastric pain: Secondary | ICD-10-CM | POA: Insufficient documentation

## 2021-06-05 DIAGNOSIS — K219 Gastro-esophageal reflux disease without esophagitis: Secondary | ICD-10-CM | POA: Insufficient documentation

## 2021-06-05 DIAGNOSIS — Z20822 Contact with and (suspected) exposure to covid-19: Secondary | ICD-10-CM | POA: Insufficient documentation

## 2021-06-05 DIAGNOSIS — J069 Acute upper respiratory infection, unspecified: Secondary | ICD-10-CM | POA: Insufficient documentation

## 2021-06-05 LAB — URINALYSIS, ROUTINE W REFLEX MICROSCOPIC
Bilirubin Urine: NEGATIVE
Glucose, UA: NEGATIVE mg/dL
Hgb urine dipstick: NEGATIVE
Ketones, ur: NEGATIVE mg/dL
Leukocytes,Ua: NEGATIVE
Nitrite: NEGATIVE
Protein, ur: NEGATIVE mg/dL
Specific Gravity, Urine: 1.013 (ref 1.005–1.030)
pH: 6 (ref 5.0–8.0)

## 2021-06-05 LAB — COMPREHENSIVE METABOLIC PANEL
ALT: 23 U/L (ref 0–44)
AST: 18 U/L (ref 15–41)
Albumin: 4.1 g/dL (ref 3.5–5.0)
Alkaline Phosphatase: 64 U/L (ref 38–126)
Anion gap: 7 (ref 5–15)
BUN: 11 mg/dL (ref 6–20)
CO2: 25 mmol/L (ref 22–32)
Calcium: 8.4 mg/dL — ABNORMAL LOW (ref 8.9–10.3)
Chloride: 102 mmol/L (ref 98–111)
Creatinine, Ser: 0.65 mg/dL (ref 0.44–1.00)
GFR, Estimated: >60 mL/min (ref >60)
Glucose, Bld: 108 mg/dL — ABNORMAL HIGH (ref 70–99)
Potassium: 3.9 mmol/L (ref 3.5–5.1)
Sodium: 134 mmol/L — ABNORMAL LOW (ref 135–145)
Total Bilirubin: 0.4 mg/dL (ref 0.3–1.2)
Total Protein: 7.3 g/dL (ref 6.5–8.1)

## 2021-06-05 LAB — CBC WITH DIFFERENTIAL/PLATELET
Abs Immature Granulocytes: 0.02 10*3/uL (ref 0.00–0.07)
Basophils Absolute: 0 10*3/uL (ref 0.0–0.1)
Basophils Relative: 1 %
Eosinophils Absolute: 0.2 10*3/uL (ref 0.0–0.5)
Eosinophils Relative: 3 %
HCT: 40.6 % (ref 36.0–46.0)
Hemoglobin: 13.7 g/dL (ref 12.0–15.0)
Immature Granulocytes: 0 %
Lymphocytes Relative: 32 %
Lymphs Abs: 2 10*3/uL (ref 0.7–4.0)
MCH: 31.1 pg (ref 26.0–34.0)
MCHC: 33.7 g/dL (ref 30.0–36.0)
MCV: 92.1 fL (ref 80.0–100.0)
Monocytes Absolute: 0.4 10*3/uL (ref 0.1–1.0)
Monocytes Relative: 7 %
Neutro Abs: 3.6 10*3/uL (ref 1.7–7.7)
Neutrophils Relative %: 57 %
Platelets: 284 10*3/uL (ref 150–400)
RBC: 4.41 MIL/uL (ref 3.87–5.11)
RDW: 13.2 % (ref 11.5–15.5)
WBC: 6.2 10*3/uL (ref 4.0–10.5)
nRBC: 0 % (ref 0.0–0.2)

## 2021-06-05 LAB — LIPASE, BLOOD: Lipase: 32 U/L (ref 11–51)

## 2021-06-05 LAB — RESP PANEL BY RT-PCR (FLU A&B, COVID) ARPGX2
Influenza A by PCR: NEGATIVE
Influenza B by PCR: NEGATIVE
SARS Coronavirus 2 by RT PCR: NEGATIVE

## 2021-06-05 LAB — PREGNANCY, URINE: Preg Test, Ur: NEGATIVE

## 2021-06-05 MED ORDER — GUAIFENESIN 100 MG/5ML PO LIQD: 100.0000 mg | ORAL | 0 refills | Status: DC | PRN

## 2021-06-05 MED ORDER — FAMOTIDINE 20 MG PO TABS: 20.0000 mg | ORAL_TABLET | Freq: Two times a day (BID) | ORAL | 0 refills | Status: DC

## 2021-06-05 MED ORDER — ALUM & MAG HYDROXIDE-SIMETH 200-200-20 MG/5ML PO SUSP
30.0000 mL | Freq: Once | ORAL | Status: AC
  Administered 2021-06-05: 11:00:00 30 mL via ORAL
  Filled 2021-06-05: qty 30

## 2021-06-05 MED ORDER — BENZONATATE 100 MG PO CAPS: 100.0000 mg | ORAL_CAPSULE | Freq: Three times a day (TID) | ORAL | 0 refills | Status: DC

## 2021-06-05 NOTE — ED Triage Notes (Signed)
Abdominal pain that started yesterday. Cough that started Saturday with ear pain and congestion.

## 2021-06-05 NOTE — Discharge Instructions (Addendum)
You are seen in the emergency department today for epigastric abdominal pain and an upper respiratory virus with coughing.  While I do think that your abdominal pain is likely related to the ongoing coughing, its possible that you are having increasing gastric reflux symptoms.  He did not have COVID or the flu.  I am prescribing you cough medication to reduce some your symptoms.  Please continue to take over-the-counter cough and cold medications.  Continue to take Tylenol and ibuprofen as needed for pain.  I am also prescribing you Pepcid which is a medication that you can take for acid reflux.  You are taking omeprazole already.  You can take Pepcid if your symptoms continue after you are finished with your upper respiratory infection.  Please return to emergency department for worsening shortness of breath, cough or abdominal pain with fever.  Please follow-up with your primary care provider.

## 2021-06-05 NOTE — ED Provider Notes (Signed)
[facility] Provider Note   CSN: [id number] Arrival date & time: 06/05/21  [id number]     History Chief Complaint  Patient presents with   Abdominal Pain    Dana Lane is a 25 y.o. female.  With past medical history of GERD, prediabetes, anemia who presents to the emergency department with abdominal pain.  She states abdominal pain is epigastric and started yesterday.  She endorses abdominal soreness that is worse with coughing.  She denies nausea, vomiting, diarrhea or fevers.  She denies any urinary symptoms including dysuria, hematuria, decreased urine output.  Last menstrual period 11/21.  Denies previous abdominal surgeries.  She states that she has been coughing since Saturday.  She endorses head and chest congestion.  She denies any sick contacts.  She states she has been taking DayQuil and NyQuil without relief of symptoms.   Abdominal Pain Associated symptoms: cough and sore throat   Associated symptoms: no chest pain, no diarrhea, no dysuria, no fever, no hematuria, no nausea, no shortness of breath, no vaginal bleeding, no vaginal discharge and no vomiting       Past Medical History:  Diagnosis Date   Anemia    Ankle sprain    right   Depression    Fatty liver disease, nonalcoholic    GERD (gastroesophageal reflux disease)    Pre-diabetes    Tonsillitis     Patient Active Problem List   Diagnosis Date Noted   Fatty liver disease, nonalcoholic    Odynophagia [id number]   Nausea without vomiting 09/19/2020   Abdominal pain 03/21/2020   Constipation 03/21/2020   Anemia 06/28/2019   Pregnancy test negative 05/25/2019   Missed periods 05/05/2019   Weight gain 05/05/2019   Patient desires pregnancy 05/05/2019   Rectal bleeding 03/17/2019   Pain of upper abdomen 03/17/2019   MDD (major depressive disorder), recurrent episode, moderate ([place]) 03/25/2018   Obesity (BMI 30-39.9) 12/11/2017   Chest wall pain 09/14/2015   Gastroesophageal reflux  disease 06/18/2015    Past Surgical History:  Procedure Laterality Date   BIOPSY  05/02/2019   Procedure: BIOPSY;  Surgeon: Daneil Dolin, MD;  Location: [facility];  Service: Endoscopy;;  gastric polyps    COLONOSCOPY WITH PROPOFOL N/A 05/02/2019   Rourk: Normal exam, next colonoscopy at initial screening age, 2   ESOPHAGOGASTRODUODENOSCOPY (EGD) WITH PROPOFOL N/A 05/02/2019   Rourk: Multiple gastric polyps, biopsy showed fundic gland polyp.  Gastric biopsy showed chronic inactive gastritis, no H. pylori.   EYE SURGERY  25yo   TONSILLECTOMY AND ADENOIDECTOMY Bilateral 11/16/2018   Procedure: TONSILLECTOMY AND ADENOIDECTOMY;  Surgeon: Leta Baptist, MD;  Location: [address];  Service: ENT;  Laterality: Bilateral;     OB History     Gravida  0   Para  0   Term  0   Preterm  0   AB  0   Living  0      SAB  0   IAB  0   Ectopic  0   Multiple  0   Live Births  0           Family History  Problem Relation Age of Onset   Diabetes Mother    Cervical cancer Mother        ? patient isn't sure. 53 mom had this when she was younger (<50 y.o.)   Cancer Father        prostate   Depression Sister    Cancer  Sister        bladder   Heart disease Paternal Grandfather    Colon polyps Neg Hx    Colon cancer Neg Hx     Social History   Tobacco Use   Smoking status: Never   Smokeless tobacco: Never  Vaping Use   Vaping Use: Never used  Substance Use Topics   Alcohol use: Not Currently   Drug use: No    Home Medications Prior to Admission medications   Medication Sig Start Date End Date Taking? Authorizing Provider  busPIRone (BUSPAR) 15 MG tablet Take 1 tablet (15 mg total) by mouth 2 (two) times daily. 02/19/21   Soyla Dryer, PA-C  Calcium Carbonate Antacid (ANTACID E-X PO) Take by mouth daily as needed.    [provider]  omeprazole (PRILOSEC) 40 MG capsule Take 40mg  daily before breakfast and take additional dose before  supper if needed. 01/23/21   Mahala Menghini, PA-C  Prenatal Vit-Fe Fumarate-FA (MULTIVITAMIN-PRENATAL) 27-0.8 MG TABS tablet Take 1 tablet by mouth daily at 12 noon.    [provider]    Allergies    Poison oak extract [poison oak extract], Chocolate, and Eggs or egg-derived products  Review of Systems   Review of Systems  Constitutional:  Negative for appetite change and fever.  HENT:  Positive for congestion, ear pain and sore throat.   Respiratory:  Positive for cough. Negative for shortness of breath.   Cardiovascular:  Negative for chest pain and palpitations.  Gastrointestinal:  Positive for abdominal pain. Negative for diarrhea, nausea and vomiting.  Genitourinary:  Negative for dysuria, hematuria, vaginal bleeding and vaginal discharge.  All other systems reviewed and are negative.  Physical Exam Updated Vital Signs BP 125/86 (BP Location: Right Arm)    Pulse (!) 109    Temp 98.6 F (37 C) (Oral)    Resp 20    Ht 5\' 4"  (1.626 m)    Wt 95.7 kg    LMP 05/13/2021 (Approximate)    SpO2 99%    BMI 36.22 kg/m   Physical Exam Vitals and nursing note reviewed.  Constitutional:      General: She is not in acute distress.    Appearance: Normal appearance. She is well-developed. She is obese. She is ill-appearing. She is not toxic-appearing.  HENT:     Head: Normocephalic and atraumatic.     Nose: Congestion present.     Mouth/Throat:     Mouth: Mucous membranes are moist.     Pharynx: Oropharynx is clear.  Eyes:     General: No scleral icterus.    Extraocular Movements: Extraocular movements intact.     Pupils: Pupils are equal, round, and reactive to light.  Cardiovascular:     Rate and Rhythm: Normal rate and regular rhythm.     Heart sounds: Normal heart sounds. No murmur heard. Pulmonary:     Effort: Pulmonary effort is normal. No respiratory distress.     Breath sounds: Normal breath sounds. No wheezing.  Abdominal:     General: Abdomen is protuberant. Bowel  sounds are normal. There is no distension.     Palpations: Abdomen is soft.     Tenderness: There is abdominal tenderness in the right upper quadrant and epigastric area. There is no right CVA tenderness, left CVA tenderness, guarding or rebound. Negative signs include Rovsing's sign.  Musculoskeletal:        General: Normal range of motion.     Cervical back: Normal range of motion  and neck supple. No tenderness.  Skin:    General: Skin is warm and dry.     Capillary Refill: Capillary refill takes less than 2 seconds.  Neurological:     General: No focal deficit present.     Mental Status: She is alert and oriented to person, place, and time. Mental status is at baseline.  Psychiatric:        Mood and Affect: Mood normal.        Behavior: Behavior normal.    ED Results / Procedures / Treatments   Labs (all labs ordered are listed, but only abnormal results are displayed) Labs Reviewed  COMPREHENSIVE METABOLIC PANEL - Abnormal; Notable for the following components:      Result Value   Sodium 134 (*)    Glucose, Bld 108 (*)    Calcium 8.4 (*)    All other components within normal limits  URINALYSIS, ROUTINE W REFLEX MICROSCOPIC - Abnormal; Notable for the following components:   APPearance HAZY (*)    All other components within normal limits  RESP PANEL BY RT-PCR (FLU A&B, COVID) ARPGX2  LIPASE, BLOOD  CBC WITH DIFFERENTIAL/PLATELET  PREGNANCY, URINE  I-STAT BETA HCG BLOOD, ED ([facility], [facility], [facility] ONLY)   EKG None  Radiology No results found.  Procedures Procedures   Medications Ordered in ED Medications  alum & mag hydroxide-simeth (MAALOX/MYLANTA) 200-200-20 MG/5ML suspension 30 mL (30 mLs Oral Given 06/05/21 1039)    ED Course  I have reviewed the triage vital signs and the nursing notes.  Pertinent labs & imaging results that were available during my care of the patient were reviewed by me and considered in my medical decision making (see chart for details).    MDM  Rules/Calculators/A&P 25 year old female who presents to emergency department with viral upper respiratory symptoms and epigastric abdominal pain.  Upper respiratory symptoms likely viral in etiology.  COVID and flu negative. Epigastric pain likely secondary to gastritis related to viral infection versus ongoing GERD.  Her abdominal exam is without peritoneal signs.  There is no evidence of acute abdomen at this time.  She is well-appearing.  Given her work-up I have low suspicion for acute hepatobiliary disease including acute cholecystitis or cholangitis.  Doubt GI bleed.  Lipase negative, doubt acute pancreatitis.  Doubt acute gastric perforation or infectious process such as pneumonia, hepatitis, pyelonephritis.  Doubt bowel obstruction or viscus perforation.  Doubt ACS as component of her symptoms.  Her presentation is not consistent with other acute, emergent cause abdominal pain at this time.  She does have a history of GERD.  Given GI cocktail with mild relief of symptoms. Abdominal pain can also be caused from continued coughing from viral upper respiratory symptoms. Not pregnant We will provide her with cough medication for viral upper respiratory symptoms.  I also provided her with Pepcid to take in addition to her omeprazole if she continues to have any increased GERD symptoms.  She verbalizes understanding.  She is amenable to plan at this time.  Instructed to follow-up with primary care provider.  She is instructed return for worsening abdominal pain with fever.  Will discharge. Final Clinical Impression(s) / ED Diagnoses Final diagnoses:  Viral URI with cough  Epigastric pain    Rx / DC Orders ED Discharge Orders          Ordered    benzonatate (TESSALON) 100 MG capsule  Every 8 hours        06/05/21 1320    guaiFENesin (  ROBITUSSIN) 100 MG/5ML liquid  Every 4 hours PRN        06/05/21 1320    famotidine (PEPCID) 20 MG tablet  2 times daily        06/05/21 1320              Cristopher Peru, PA-C 06/05/21 1438    Gerhard Munch, MD 06/05/21 [id number]

## 2021-06-05 NOTE — ED Notes (Signed)
Dc instructions and scripts reviewed with pt no questions or concerns at this time. Will follow up as needed. Decline wheelchair and ambulated to lobby

## 2021-07-03 ENCOUNTER — Ambulatory Visit: Payer: Medicaid Other | Admitting: Licensed Clinical Social Worker

## 2021-07-03 DIAGNOSIS — F419 Anxiety disorder, unspecified: Secondary | ICD-10-CM

## 2021-07-03 DIAGNOSIS — F32A Depression, unspecified: Secondary | ICD-10-CM

## 2021-07-03 NOTE — Progress Notes (Signed)
[facility] engaged patient in follow-up session. [facility] led patient in two different types of breathing exercises. Therapist provided reflective listening and validation as patient shared about current symptoms and her interpersonal relationships.

## 2021-07-17 ENCOUNTER — Ambulatory Visit: Payer: Medicaid Other | Admitting: Licensed Clinical Social Worker
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
## 2021-10-28 ENCOUNTER — Emergency Department (HOSPITAL_COMMUNITY)
Admission: EM | Admit: 2021-10-28 | Discharge: 2021-10-28 | Disposition: A | Discharge status: Home / Self Care | Payer: Medicaid Other | Attending: Emergency Medicine | Admitting: Emergency Medicine

## 2021-10-28 ENCOUNTER — Encounter (HOSPITAL_COMMUNITY): Payer: Self-pay

## 2021-10-28 ENCOUNTER — Other Ambulatory Visit: Payer: Self-pay

## 2021-10-28 DIAGNOSIS — W540XXA Bitten by dog, initial encounter: Secondary | ICD-10-CM | POA: Insufficient documentation

## 2021-10-28 DIAGNOSIS — Z23 Encounter for immunization: Secondary | ICD-10-CM | POA: Insufficient documentation

## 2021-10-28 DIAGNOSIS — L089 Local infection of the skin and subcutaneous tissue, unspecified: Secondary | ICD-10-CM

## 2021-10-28 DIAGNOSIS — S60311A Abrasion of right thumb, initial encounter: Secondary | ICD-10-CM | POA: Insufficient documentation

## 2021-10-28 DIAGNOSIS — S01331A Puncture wound without foreign body of right ear, initial encounter: Secondary | ICD-10-CM | POA: Insufficient documentation

## 2021-10-28 MED ORDER — TETANUS-DIPHTH-ACELL PERTUSSIS 5-2.5-18.5 LF-MCG/0.5 IM SUSY
0.5000 mL | PREFILLED_SYRINGE | Freq: Once | INTRAMUSCULAR | Status: AC
  Administered 2021-10-28: 0.5 mL via INTRAMUSCULAR
  Filled 2021-10-28: qty 0.5

## 2021-10-28 MED ORDER — AMOXICILLIN-POT CLAVULANATE 875-125 MG PO TABS: 1.0000 | ORAL_TABLET | Freq: Two times a day (BID) | ORAL | 0 refills | Status: DC

## 2021-10-28 NOTE — Discharge Instructions (Addendum)
It appears that you have a wound infection of the right ear, after the dog bite.  The wounds of the ear are closed.  It appears you have developed an infection of the right ear that requires antibiotic medication to help the infection heal, the affected areas 2-3 times daily with soap and water.  Also for the ear, use a warm compress on it 3-4 times a day for 30 minutes.  This can help to improve healing.  We sent a prescription for antibiotic to your pharmacy to help the wounds heal.  Return here or see your doctor as needed for problems, or if you are concerned that the wounds and infection are not healing.  Because of a small risk of rabies, your dog needs to be observed for 10 to 14 days to make sure that it does not any signs or symptoms of rabies.  You should probably ask your veterinarian to assist you with that observation. ?

## 2021-10-28 NOTE — ED Provider Notes (Signed)
?[facility] EM[facility] DEPARTMENT ?Provider Note ? ? ?CSN: [id number] ?Arrival date & time: 10/28/21  0758 ? ?  ? ?History ? ?No chief complaint on file. ? ? ?Dana Lane is a 26 y.o. female. ? ?HPI ?Patient presents complaining of painful and swollen ear, after a dog bite, 3 days ago.  She was bitten by her dog when she picked it up to give it a bath.  She was injured on the left thumb iand the right ear. ?  ? ?Home Medications ?Prior to Admission medications   ?Medication Sig Start Date End Date Taking? Authorizing Provider  ?amoxicillin-clavulanate (AUGMENTIN) 875-125 MG tablet Take 1 tablet by mouth 2 (two) times daily. One po bid x 7 days 10/28/21  Yes Mancel Bale, MD  ?benzonatate (TESSALON) 100 MG capsule Take 1 capsule (100 mg total) by mouth every 8 (eight) hours. 06/05/21   Cristopher Peru, PA-C  ?busPIRone (BUSPAR) 15 MG tablet Take 1 tablet (15 mg total) by mouth 2 (two) times daily. 02/19/21   Jacquelin Hawking, PA-C  ?famotidine (PEPCID) 20 MG tablet Take 1 tablet (20 mg total) by mouth 2 (two) times daily. 06/05/21   Cristopher Peru, PA-C  ?guaiFENesin (ROBITUSSIN) 100 MG/5ML liquid Take 5-10 mLs (100-200 mg total) by mouth every 4 (four) hours as needed for cough or to loosen phlegm. 06/05/21   Cristopher Peru, PA-C  ?omeprazole (PRILOSEC) 40 MG capsule Take 40mg  daily before breakfast and take additional dose before supper if needed. 01/23/21   03/25/21, PA-C  ?Prenatal Vit-Fe Fumarate-FA (MULTIVITAMIN-PRENATAL) 27-0.8 MG TABS tablet Take 1 tablet by mouth daily at 12 noon.    [provider]  ?   ? ?Allergies    ?Poison oak extract [poison oak extract], Chocolate, and Eggs or egg-derived products   ? ?Review of Systems   ?Review of Systems ? ?Physical Exam ?Updated Vital Signs ?BP (!) 142/102 (BP Location: Left Arm)   Pulse 82   Temp 97.6 ?F (36.4 ?C) (Oral)   Resp 18   Ht 5\' 5"  (1.651 m)   Wt 95.7 kg   SpO2 99%   BMI 35.11 kg/m?  ?Physical Exam ?Vitals and nursing note reviewed.   ?Constitutional:   ?   General: She is not in acute distress. ?   Appearance: She is well-developed. She is not ill-appearing or diaphoretic.  ?HENT:  ?   Head: Normocephalic and atraumatic.  ?   Right Ear: External ear normal.  ?   Left Ear: External ear normal.  ?Eyes:  ?   Conjunctiva/sclera: Conjunctivae normal.  ?   Pupils: Pupils are equal, round, and reactive to light.  ?   Comments: Right pinna somewhat swollen and reddened and tender.  Tenderness and swelling is located in the upper aspect and associated with 2 wounds, 1 behind the pinna and 1 in the outer helix.  Both of these wounds are approximated, and without drainage, or bleeding.  There is no associated adenopathy, or involvement of the auditory canal or lower ear structures.  ?Neck:  ?   Trachea: Phonation normal.  ?Cardiovascular:  ?   Rate and Rhythm: Normal rate and regular rhythm.  ?Pulmonary:  ?   Effort: Pulmonary effort is normal.  ?Abdominal:  ?   General: There is no distension.  ?Musculoskeletal:     ?   General: Normal range of motion.  ?   Cervical back: Normal range of motion and neck supple.  ?Skin: ?   General: Skin  is warm and dry.  ?   Comments: Small abrasion, right dorsal thumb, healing without infection.  ?Neurological:  ?   Mental Status: She is alert and oriented to person, place, and time.  ?   Cranial Nerves: No cranial nerve deficit.  ?   Sensory: No sensory deficit.  ?   Motor: No abnormal muscle tone.  ?   Coordination: Coordination normal.  ?Psychiatric:     ?   Mood and Affect: Mood normal.     ?   Behavior: Behavior normal.     ?   Thought Content: Thought content normal.     ?   Judgment: Judgment normal.  ? ? ?ED Results / Procedures / Treatments   ?Labs ?(all labs ordered are listed, but only abnormal results are displayed) ?Labs Reviewed - No data to display ? ?EKG ?None ? ?Radiology ?No results found. ? ?Procedures ?Procedures  ? ? ?Medications Ordered in ED ?Medications  ?Tdap (BOOSTRIX) injection 0.5 mL (0.5 mLs  Intramuscular Given 10/28/21 0855)  ? ? ?ED Course/ Medical Decision Making/ A&P ?  ?                        ?Medical Decision Making ?Patient presenting for evaluation of wounds of right ear and left hand, after dog bite, 3 days ago.  She has not had a tetanus booster in the last 12 years.  Wound of the right ear shows inflammatory changes with possible infection.  Wound of left hand is minor and healing well. ? ?Problems Addressed: ?Dog bite, initial encounter: acute illness or injury ?   Details: Animal owned by patient's family and under observation by them. ?Wound infection: acute illness or injury ?   Details: Secondary to dog bite ? ?Risk ?Prescription drug management. ?Decision regarding hospitalization. ?Risk Details: Subacute wounds from dog bite with possible infection right ear.  Initiation of oral antibiotics indicated.  No indication for surgical treatment, or further ED evaluation.  Doubt sepsis, retained foreign body, or high risk for rabies.  Patient instructed on wound care, given prescription for Augmentin, and instructed to watch dog for signs of rabies.  She was instructed to have her veterinarian assist with that ? ? ? ? ? ? ? ? ? ? ?Final Clinical Impression(s) / ED Diagnoses ?Final diagnoses:  ?Dog bite, initial encounter  ?Wound infection  ? ? ?Rx / DC Orders ?ED Discharge Orders   ? ?      Ordered  ?  amoxicillin-clavulanate (AUGMENTIN) 875-125 MG tablet  2 times daily       ? 10/28/21 0824  ? ?  ?  ? ?  ? ? ?  ?Mancel Bale, MD ?10/28/21 [phone] ? ?

## 2021-10-28 NOTE — ED Triage Notes (Signed)
Patient states she was bit by her own dog Saturday and now was increased redness, swelling and pain. Patient states her dog has had vaccines.  ?

## 2022-01-06 ENCOUNTER — Encounter (HOSPITAL_COMMUNITY): Payer: Self-pay | Admitting: *Deleted

## 2022-01-06 ENCOUNTER — Other Ambulatory Visit: Payer: Self-pay

## 2022-01-06 ENCOUNTER — Emergency Department (HOSPITAL_COMMUNITY)
Admission: EM | Admit: 2022-01-06 | Discharge: 2022-01-06 | Disposition: A | Discharge status: Home / Self Care | Payer: Medicaid Other | Attending: Emergency Medicine | Admitting: Emergency Medicine

## 2022-01-06 DIAGNOSIS — W57XXXA Bitten or stung by nonvenomous insect and other nonvenomous arthropods, initial encounter: Secondary | ICD-10-CM | POA: Insufficient documentation

## 2022-01-06 DIAGNOSIS — S80861A Insect bite (nonvenomous), right lower leg, initial encounter: Secondary | ICD-10-CM | POA: Insufficient documentation

## 2022-01-06 MED ORDER — TRIAMCINOLONE ACETONIDE 0.1 % EX CREA: 1.0000 | TOPICAL_CREAM | Freq: Two times a day (BID) | CUTANEOUS | 0 refills | Status: DC

## 2022-01-06 MED ORDER — LORATADINE 10 MG PO TABS: 10.0000 mg | ORAL_TABLET | Freq: Every day | ORAL | 0 refills | Status: AC

## 2022-01-06 MED ORDER — FAMOTIDINE 20 MG PO TABS: 20.0000 mg | ORAL_TABLET | Freq: Two times a day (BID) | ORAL | 0 refills | Status: DC

## 2022-01-06 NOTE — ED Provider Notes (Signed)
[facility] EMERGENCY DEPARTMENT Provider Note   CSN: [id number] Arrival date & time: 01/06/22  1126     History  Chief Complaint  Patient presents with   Insect Bite    Dana Lane is a 26 y.o. female.  HPI  Without significant medical history presents with complaints of a bee sting.  Patient states that she was stung by a wasp yesterday on her right leg, she states that it got red and swollen, the area is now painful and itchy, says that she took Benadryl yesterday and Benadryl today without much relief, denies any tongue throat lip swelling difficulty breathing no systemic rash, states that she has never had an anaphylactic reaction to a bee sting in the past.  Up-to-date on her tetanus shot, she has no other complaints.  Home Medications Prior to Admission medications   Medication Sig Start Date End Date Taking? Authorizing Provider  famotidine (PEPCID) 20 MG tablet Take 1 tablet (20 mg total) by mouth 2 (two) times daily for 7 days. 01/06/22 01/13/22 Yes Carroll Sage, PA-C  loratadine (CLARITIN) 10 MG tablet Take 1 tablet (10 mg total) by mouth daily for 7 days. 01/06/22 01/13/22 Yes Carroll Sage, PA-C  triamcinolone cream (KENALOG) 0.1 % Apply 1 Application topically 2 (two) times daily. 01/06/22  Yes Carroll Sage, PA-C  amoxicillin-clavulanate (AUGMENTIN) 875-125 MG tablet Take 1 tablet by mouth 2 (two) times daily. One po bid x 7 days 10/28/21   Mancel Bale, MD  benzonatate (TESSALON) 100 MG capsule Take 1 capsule (100 mg total) by mouth every 8 (eight) hours. 06/05/21   Cristopher Peru, PA-C  busPIRone (BUSPAR) 15 MG tablet Take 1 tablet (15 mg total) by mouth 2 (two) times daily. 02/19/21   Jacquelin Hawking, PA-C  guaiFENesin (ROBITUSSIN) 100 MG/5ML liquid Take 5-10 mLs (100-200 mg total) by mouth every 4 (four) hours as needed for cough or to loosen phlegm. 06/05/21   Cristopher Peru, PA-C  omeprazole (PRILOSEC) 40 MG capsule Take 40mg  daily before breakfast  and take additional dose before supper if needed. 01/23/21   03/25/21, PA-C  Prenatal Vit-Fe Fumarate-FA (MULTIVITAMIN-PRENATAL) 27-0.8 MG TABS tablet Take 1 tablet by mouth daily at 12 noon.    [provider]      Allergies    Poison oak extract [poison oak extract], Chocolate, and Eggs or egg-derived products    Review of Systems   Review of Systems  Constitutional:  Negative for chills and fever.  Respiratory:  Negative for shortness of breath.   Cardiovascular:  Negative for chest pain.  Gastrointestinal:  Negative for abdominal pain.  Skin:  Positive for rash.  Neurological:  Negative for headaches.    Physical Exam Updated Vital Signs BP 120/88 (BP Location: Right Arm)   Pulse 80   Temp 98 F (36.7 C) (Oral)   Resp 16   Ht 5' 4.5" (1.638 m)   Wt 95.7 kg   LMP 12/31/2021   SpO2 100%   BMI 35.66 kg/m  Physical Exam Vitals and nursing note reviewed.  Constitutional:      General: She is not in acute distress.    Appearance: She is not ill-appearing.  HENT:     Head: Normocephalic and atraumatic.     Nose: No congestion.     Mouth/Throat:     Mouth: Mucous membranes are moist.     Pharynx: Oropharynx is clear. No oropharyngeal exudate or posterior oropharyngeal erythema.     Comments:  No trismus no torticollis no no facial swelling, tongue and uvula both midline controlling oral secretions tonsils are both equal symmetrical bilaterally no submandibular swelling Eyes:     Conjunctiva/sclera: Conjunctivae normal.  Cardiovascular:     Rate and Rhythm: Normal rate and regular rhythm.     Pulses: Normal pulses.     Heart sounds: No murmur heard.    No friction rub. No gallop.  Pulmonary:     Effort: No respiratory distress.     Breath sounds: No wheezing, rhonchi or rales.  Skin:    General: Skin is warm and dry.     Comments: Patient isolated rash on her right upper calf, slightly erythematous with a papule in the middle of it, drainage clears  fluids, area was warm to the touch no fluctuance or induration present.  Neurological:     Mental Status: She is alert.  Psychiatric:        Mood and Affect: Mood normal.     ED Results / Procedures / Treatments   Labs (all labs ordered are listed, but only abnormal results are displayed) Labs Reviewed - No data to display  EKG None  Radiology No results found.  Procedures Procedures    Medications Ordered in ED Medications - No data to display  ED Course/ Medical Decision Making/ A&P                           Medical Decision Making  This patient presents to the ED for concern of bee sting, this involves an extensive number of treatment options, and is a complaint that carries with it a high risk of complications and morbidity.  The differential diagnosis includes cellulitis, abscess, anaphylaxis    Additional history obtained:  Additional history obtained from N/A External records from outside source obtained and reviewed including N/A   Co morbidities that complicate the patient evaluation  N/A  Social Determinants of Health:  N/A    Lab Tests:  I Ordered, and personally interpreted labs.  The pertinent results include: N/A   Imaging Studies ordered:  I ordered imaging studies including N/A I independently visualized and interpreted imaging which showed N/A I agree with the radiologist interpretation   Cardiac Monitoring:  The patient was maintained on a cardiac monitor.  I personally viewed and interpreted the cardiac monitored which showed an underlying rhythm of: N/A   Medicines ordered and prescription drug management:  I ordered medication including  N/A  I have reviewed the patients home medicines and have made adjustments as needed  Critical Interventions:  N/A   Reevaluation:  Presents with a bee sting, benign physical exam, agreement plan discharge at this time.   Consultations Obtained:  N/A    Test  Considered:  N/A    Rule out Low suspicion for cellulitis and or abscess as there is no evidence of infection on my exam, it is slightly erythematous and warm to the touch but my suspicion for infection is very low as as it would be atypical to developing infection within 24 hours, likely this is secondary due to inflammatory spots from the bee sting.  Low suspicion for anaphylaxis she has no systemic rash vital signs reassuring no oral involvement.    Dispostion and problem list  After consideration of the diagnostic results and the patients response to treatment, I feel that the patent would benefit from discharge.  Bee sting-likely patient from inflammatory reaction from the sting, will provide  with H1 and H2 blockers as well as a steroid cream can follow with PCP for further evaluation.            Final Clinical Impression(s) / ED Diagnoses Final diagnoses:  Insect bite of right lower leg, initial encounter    Rx / DC Orders ED Discharge Orders          Ordered    famotidine (PEPCID) 20 MG tablet  2 times daily        01/06/22 1226    loratadine (CLARITIN) 10 MG tablet  Daily        01/06/22 1226    triamcinolone cream (KENALOG) 0.1 %  2 times daily        01/06/22 1226              Barnie Del 01/06/22 1228    Bethann Berkshire, MD 01/09/22 [phone]

## 2022-01-06 NOTE — Discharge Instructions (Signed)
Likely just have a reaction from the bug bite, I have given you Pepcid, Claritin, and a steroid cream please take as prescribed.  You may also use Benadryl as needed.  If you note after using this medication for 3 days time you have worsening redness or swelling the area increased pain he should come back in for reassessment as possible this might be an infection.

## 2022-01-06 NOTE — ED Triage Notes (Signed)
Pt stung by a wasp yesterday to left lower posterior leg.  Pt with pain and itching.  Taking benadryl and icing area without relief.

## 2022-01-27 ENCOUNTER — Telehealth: Payer: Self-pay

## 2022-01-27 ENCOUNTER — Other Ambulatory Visit: Payer: Self-pay

## 2022-01-27 ENCOUNTER — Encounter (HOSPITAL_COMMUNITY): Payer: Self-pay

## 2022-01-27 ENCOUNTER — Emergency Department (HOSPITAL_COMMUNITY)
Admission: EM | Admit: 2022-01-27 | Discharge: 2022-01-27 | Disposition: A | Discharge status: Home / Self Care | Payer: Medicaid Other | Attending: Emergency Medicine | Admitting: Emergency Medicine

## 2022-01-27 DIAGNOSIS — K0889 Other specified disorders of teeth and supporting structures: Secondary | ICD-10-CM

## 2022-01-27 MED ORDER — AMOXICILLIN 500 MG PO CAPS: 500.0000 mg | ORAL_CAPSULE | Freq: Three times a day (TID) | ORAL | 0 refills | Status: DC

## 2022-01-27 MED ORDER — AMOXICILLIN 250 MG PO CAPS
500.0000 mg | ORAL_CAPSULE | Freq: Once | ORAL | Status: AC
  Administered 2022-01-27: 500 mg via ORAL
  Filled 2022-01-27: qty 2

## 2022-01-27 NOTE — ED Provider Notes (Signed)
[facility] EMERGENCY DEPARTMENT Provider Note   CSN: [id number] Arrival date & time: 01/27/22  1924     History  Chief Complaint  Patient presents with   Jaw Pain    Dana Lane is a 26 y.o. female.  Patient is a 26 year old female with past medical history of GERD, depression, anemia.  Patient presenting today with complaints of dental pain.  She reports pain to the right upper jaw that has been worsening over the past several days.  She reports having a bad tooth with a partial root canal in the past.  She denies any facial swelling, difficulty breathing or swallowing.  Pain worse with eating or drinking and no alleviating factors.  The history is provided by the patient.       Home Medications Prior to Admission medications   Medication Sig Start Date End Date Taking? Authorizing Provider  amoxicillin-clavulanate (AUGMENTIN) 875-125 MG tablet Take 1 tablet by mouth 2 (two) times daily. One po bid x 7 days 10/28/21   Mancel Bale, MD  benzonatate (TESSALON) 100 MG capsule Take 1 capsule (100 mg total) by mouth every 8 (eight) hours. 06/05/21   Cristopher Peru, PA-C  busPIRone (BUSPAR) 15 MG tablet Take 1 tablet (15 mg total) by mouth 2 (two) times daily. 02/19/21   Jacquelin Hawking, PA-C  famotidine (PEPCID) 20 MG tablet Take 1 tablet (20 mg total) by mouth 2 (two) times daily for 7 days. 01/06/22 01/13/22  Carroll Sage, PA-C  guaiFENesin (ROBITUSSIN) 100 MG/5ML liquid Take 5-10 mLs (100-200 mg total) by mouth every 4 (four) hours as needed for cough or to loosen phlegm. 06/05/21   Cristopher Peru, PA-C  loratadine (CLARITIN) 10 MG tablet Take 1 tablet (10 mg total) by mouth daily for 7 days. 01/06/22 01/13/22  Carroll Sage, PA-C  omeprazole (PRILOSEC) 40 MG capsule Take 40mg  daily before breakfast and take additional dose before supper if needed. 01/23/21   03/25/21, PA-C  Prenatal Vit-Fe Fumarate-FA (MULTIVITAMIN-PRENATAL) 27-0.8 MG TABS tablet Take 1 tablet by  mouth daily at 12 noon.    [provider]  triamcinolone cream (KENALOG) 0.1 % Apply 1 Application topically 2 (two) times daily. 01/06/22   01/08/22, PA-C      Allergies    Poison oak extract [poison oak extract], Chocolate, and Eggs or egg-derived products    Review of Systems   Review of Systems  All other systems reviewed and are negative.   Physical Exam Updated Vital Signs BP (!) 143/97 (BP Location: Right Arm)   Pulse 88   Temp 98.2 F (36.8 C) (Oral)   Resp 17   Ht 5' 4.5" (1.638 m)   Wt 96.6 kg   LMP 12/31/2021   SpO2 100%   BMI 36.00 kg/m  Physical Exam Vitals and nursing note reviewed.  Constitutional:      General: She is not in acute distress.    Appearance: Normal appearance. She is not ill-appearing.  HENT:     Head: Normocephalic and atraumatic.     Mouth/Throat:     Mouth: Mucous membranes are moist.     Pharynx: No oropharyngeal exudate or posterior oropharyngeal erythema.     Comments: The dentition appears intact and normal.  I see no obvious abscess or other abnormality. Pulmonary:     Effort: Pulmonary effort is normal.  Skin:    General: Skin is warm and dry.  Neurological:     Mental Status: She is alert.  ED Results / Procedures / Treatments   Labs (all labs ordered are listed, but only abnormal results are displayed) Labs Reviewed - No data to display  EKG None  Radiology No results found.  Procedures Procedures    Medications Ordered in ED Medications  amoxicillin (AMOXIL) capsule 500 mg (has no administration in time range)    ED Course/ Medical Decision Making/ A&P  Patient presenting with dental pain.  I will treat with amoxicillin, ibuprofen, and follow-up with dentistry.  Final Clinical Impression(s) / ED Diagnoses Final diagnoses:  None    Rx / DC Orders ED Discharge Orders     None         Geoffery Lyons, MD 01/27/22 2309

## 2022-01-27 NOTE — Telephone Encounter (Signed)
Care Connect client began text messaging this RN at 1:00 PM regarding mouth and ear and facial pain asking for dental help. Client has been seen at [facility], but was last seen almost one year ago. Client's Care Connect expired 01/11/22. Last connected with client on 01/09/22 and discussed all documents needed to renew. And client was going to call once she had her paystubs, taxes and bank account statement. Today she states she hasn't had time to collect those. She does state she has paystubs and taxes but has not been able to get her bank statement.  She reports she needs relief from pain as it started on this past Friday. Again discussed what we needed to renew her so she could be seen at [facility] and get a dental referral or medications if needed. This texting lasted for a considerable amount of time until after hours of [facility] as I  had suggested her earlier to call [facility] to ask if she could possibly be seen and bring documents then and we could renew her the same day. She did not call before closing. Suggested she could be seen at the [facility] in [place] if needed and she could not get her bank statement soon enough for this week. Provided client with address of Urgent Care.   Will attempt to follow up with client tomorrow to determine what her plans are. Unfortunately we do need her financial documents to renew her Care Connect and she was notified of this.  Francee Nodal RN [employer]

## 2022-01-27 NOTE — Discharge Instructions (Signed)
Begin taking amoxicillin as prescribed.  Take ibuprofen 600 mg every 6 hours as needed for pain.  Follow-up with your dentist if not improving in the next few days.

## 2022-01-27 NOTE — ED Triage Notes (Signed)
Jaw pain that started Friday. Pt has a bad tooth on left side.

## 2022-01-28 ENCOUNTER — Telehealth: Payer: Self-pay

## 2022-01-28 NOTE — Telephone Encounter (Signed)
Attempted follow up regarding ER visit for dental pain, no answer. Left text message.  Francee Nodal RN [facility]

## 2022-03-28 ENCOUNTER — Other Ambulatory Visit: Payer: Self-pay

## 2022-03-28 ENCOUNTER — Emergency Department (HOSPITAL_COMMUNITY): Payer: Medicaid Other

## 2022-03-28 ENCOUNTER — Encounter (HOSPITAL_COMMUNITY): Payer: Self-pay | Admitting: *Deleted

## 2022-03-28 ENCOUNTER — Emergency Department (HOSPITAL_COMMUNITY)
Admission: EM | Admit: 2022-03-28 | Discharge: 2022-03-28 | Disposition: A | Discharge status: Home / Self Care | Payer: Self-pay | Attending: Emergency Medicine | Admitting: Emergency Medicine

## 2022-03-28 DIAGNOSIS — S20212A Contusion of left front wall of thorax, initial encounter: Secondary | ICD-10-CM

## 2022-03-28 DIAGNOSIS — S20222A Contusion of left back wall of thorax, initial encounter: Secondary | ICD-10-CM | POA: Insufficient documentation

## 2022-03-28 DIAGNOSIS — Y92410 Unspecified street and highway as the place of occurrence of the external cause: Secondary | ICD-10-CM | POA: Insufficient documentation

## 2022-03-28 DIAGNOSIS — S299XXA Unspecified injury of thorax, initial encounter: Secondary | ICD-10-CM | POA: Diagnosis present

## 2022-03-28 DIAGNOSIS — Z79899 Other long term (current) drug therapy: Secondary | ICD-10-CM | POA: Diagnosis not present

## 2022-03-28 MED ORDER — METHOCARBAMOL 500 MG PO TABS: 500.0000 mg | ORAL_TABLET | Freq: Two times a day (BID) | ORAL | 0 refills | Status: DC | PRN

## 2022-03-28 MED ORDER — NAPROXEN 500 MG PO TABS: 500.0000 mg | ORAL_TABLET | Freq: Two times a day (BID) | ORAL | 0 refills | Status: DC

## 2022-03-28 NOTE — Discharge Instructions (Addendum)
Your x-rays do not show any signs of broken ribs, you likely have some contusions or bruising over the chest wall which will heal over time.  Please make sure that you are taking lots of deep breaths and continuing to be as active as you can.  You may take medication such as Naprosyn and Robaxin as prescribed to help with pain and muscle spasms but you will need to see your family doctor in follow-up within the week.  If you do not have a family doctor see the list below  Please take Naprosyn, 500mg  by mouth twice daily as needed for pain - this in an antiinflammatory medicine (NSAID) and is similar to ibuprofen - many people feel that it is stronger than ibuprofen and it is easier to take since it is a smaller pill.  Please use this only for 1 week - if your pain persists, you will need to follow up with your doctor in the office for ongoing guidance and pain control.  Please take Robaxin, 500 mg up to 2 or 3 times a day as needed for muscle spasm, this is a muscle relaxer, it may cause generalized weakness, sleepiness and you should not drive or do important things while taking this medication.  This includes driving a vehicle or taking care of young children, these things should not be done while taking this medication.  [facility] Primary Care Doctor List    Tula Nakayama, MD. Specialty: [facility] Medicine Contact information: [address], [address] [address]  [phone]   Sallee Lange, MD. Specialty: [facility] Medicine Contact information: [place]  [place] [address]  [phone]   Rosita Fire, MD Specialty: Internal Medicine Contact information: [place] [address]  [phone]   Delphina Cahill, MD. Specialty: Internal Medicine Contact information: [place] [address]  [phone]    [facility] (Dr. Maudie Mercury) Specialty: Family Medicine Contact information: [place] [address]   [phone]   Leslie Andrea, MD. Specialty: [facility] Medicine Contact information: [place] [address]  [phone]   Asencion Noble, MD. Specialty: Internal Medicine Contact information: [address]  [place] [address]  [place]  [address] [place], [place] [address] [phone]  Services [facility] offers a variety of basic health services.  Services include but are not limited to: Blood pressure checks  Heart rate checks  Blood sugar checks  Urine analysis  Rapid strep tests  Pregnancy tests.  Health education and referrals  People needing more complex services will be directed to a physician online. Using these virtual visits, doctors can evaluate and prescribe medicine and treatments. There will be no medication on-site, though [facility] will help patients fill their prescriptions at little to no cost.   For More information please go to: [email or web address]
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
## 2022-04-22 ENCOUNTER — Encounter: Payer: Self-pay | Admitting: *Deleted

## 2023-02-26 DIAGNOSIS — F411 Generalized anxiety disorder: Secondary | ICD-10-CM

## 2023-02-26 HISTORY — DX: Generalized anxiety disorder: F41.1

## 2023-10-20 ENCOUNTER — Other Ambulatory Visit: Payer: Self-pay

## 2023-10-20 ENCOUNTER — Emergency Department (HOSPITAL_COMMUNITY): Payer: Self-pay

## 2023-10-20 ENCOUNTER — Encounter (HOSPITAL_COMMUNITY): Payer: Self-pay

## 2023-10-20 ENCOUNTER — Inpatient Hospital Stay (HOSPITAL_COMMUNITY)
Admission: EM | Admit: 2023-10-20 | Discharge: 2023-10-22 | DRG: 399 | Disposition: A | Discharge status: Home / Self Care | Payer: Self-pay | Attending: General Surgery | Admitting: General Surgery

## 2023-10-20 DIAGNOSIS — K219 Gastro-esophageal reflux disease without esophagitis: Secondary | ICD-10-CM | POA: Diagnosis present

## 2023-10-20 DIAGNOSIS — Z8042 Family history of malignant neoplasm of prostate: Secondary | ICD-10-CM

## 2023-10-20 DIAGNOSIS — Z818 Family history of other mental and behavioral disorders: Secondary | ICD-10-CM

## 2023-10-20 DIAGNOSIS — Z791 Long term (current) use of non-steroidal anti-inflammatories (NSAID): Secondary | ICD-10-CM

## 2023-10-20 DIAGNOSIS — Z79899 Other long term (current) drug therapy: Secondary | ICD-10-CM

## 2023-10-20 DIAGNOSIS — E669 Obesity, unspecified: Secondary | ICD-10-CM | POA: Diagnosis present

## 2023-10-20 DIAGNOSIS — Z8049 Family history of malignant neoplasm of other genital organs: Secondary | ICD-10-CM

## 2023-10-20 DIAGNOSIS — K3589 Other acute appendicitis without perforation or gangrene: Principal | ICD-10-CM | POA: Diagnosis present

## 2023-10-20 DIAGNOSIS — Z91018 Allergy to other foods: Secondary | ICD-10-CM

## 2023-10-20 DIAGNOSIS — K047 Periapical abscess without sinus: Secondary | ICD-10-CM | POA: Diagnosis present

## 2023-10-20 DIAGNOSIS — F419 Anxiety disorder, unspecified: Secondary | ICD-10-CM

## 2023-10-20 DIAGNOSIS — K37 Unspecified appendicitis: Secondary | ICD-10-CM | POA: Diagnosis present

## 2023-10-20 DIAGNOSIS — K760 Fatty (change of) liver, not elsewhere classified: Secondary | ICD-10-CM | POA: Diagnosis present

## 2023-10-20 DIAGNOSIS — Z8249 Family history of ischemic heart disease and other diseases of the circulatory system: Secondary | ICD-10-CM

## 2023-10-20 DIAGNOSIS — Z833 Family history of diabetes mellitus: Secondary | ICD-10-CM

## 2023-10-20 DIAGNOSIS — F32A Depression, unspecified: Secondary | ICD-10-CM | POA: Diagnosis present

## 2023-10-20 HISTORY — DX: Anxiety disorder, unspecified: F41.9

## 2023-10-20 LAB — COMPREHENSIVE METABOLIC PANEL WITH GFR
ALT: 45 U/L — ABNORMAL HIGH (ref 0–44)
AST: 22 U/L (ref 15–41)
Albumin: 4.2 g/dL (ref 3.5–5.0)
Alkaline Phosphatase: 68 U/L (ref 38–126)
Anion gap: 5 (ref 5–15)
BUN: 11 mg/dL (ref 6–20)
CO2: 26 mmol/L (ref 22–32)
Calcium: 9.1 mg/dL (ref 8.9–10.3)
Chloride: 104 mmol/L (ref 98–111)
Creatinine, Ser: 0.79 mg/dL (ref 0.44–1.00)
GFR, Estimated: >60 mL/min (ref >60)
Glucose, Bld: 135 mg/dL — ABNORMAL HIGH (ref 70–99)
Potassium: 4.2 mmol/L (ref 3.5–5.1)
Sodium: 135 mmol/L (ref 135–145)
Total Bilirubin: 0.5 mg/dL (ref 0.0–1.2)
Total Protein: 6.9 g/dL (ref 6.5–8.1)

## 2023-10-20 LAB — CBC WITH DIFFERENTIAL/PLATELET
Abs Immature Granulocytes: 0.06 10*3/uL (ref 0.00–0.07)
Basophils Absolute: 0.1 10*3/uL (ref 0.0–0.1)
Basophils Relative: 1 %
Eosinophils Absolute: 0.1 10*3/uL (ref 0.0–0.5)
Eosinophils Relative: 0 %
HCT: 41.5 % (ref 36.0–46.0)
Hemoglobin: 13.9 g/dL (ref 12.0–15.0)
Immature Granulocytes: 0 %
Lymphocytes Relative: 17 %
Lymphs Abs: 2.4 10*3/uL (ref 0.7–4.0)
MCH: 30.5 pg (ref 26.0–34.0)
MCHC: 33.5 g/dL (ref 30.0–36.0)
MCV: 91.2 fL (ref 80.0–100.0)
Monocytes Absolute: 0.5 10*3/uL (ref 0.1–1.0)
Monocytes Relative: 3 %
Neutro Abs: 11.1 10*3/uL — ABNORMAL HIGH (ref 1.7–7.7)
Neutrophils Relative %: 79 %
Platelets: 272 10*3/uL (ref 150–400)
RBC: 4.55 MIL/uL (ref 3.87–5.11)
RDW: 12.4 % (ref 11.5–15.5)
WBC: 14.1 10*3/uL — ABNORMAL HIGH (ref 4.0–10.5)
nRBC: 0 % (ref 0.0–0.2)

## 2023-10-20 LAB — LIPASE, BLOOD: Lipase: 39 U/L (ref 11–51)

## 2023-10-20 LAB — HCG, SERUM, QUALITATIVE: Preg, Serum: NEGATIVE

## 2023-10-20 MED ORDER — MORPHINE SULFATE (PF) 2 MG/ML IV SOLN
2.0000 mg | Freq: Once | INTRAVENOUS | Status: AC
  Administered 2023-10-20: 2 mg via INTRAVENOUS
  Filled 2023-10-20: qty 1

## 2023-10-20 MED ORDER — IOHEXOL 300 MG/ML  SOLN
100.0000 mL | Freq: Once | INTRAMUSCULAR | Status: AC | PRN
  Administered 2023-10-20: 100 mL via INTRAVENOUS

## 2023-10-20 MED ORDER — PIPERACILLIN-TAZOBACTAM 3.375 G IVPB
3.3750 g | Freq: Three times a day (TID) | INTRAVENOUS | Status: DC
  Administered 2023-10-20 – 2023-10-21 (×2): 3.375 g via INTRAVENOUS
  Filled 2023-10-20 (×2): qty 50

## 2023-10-20 MED ORDER — MUPIROCIN 2 % EX OINT: 1.0000 | TOPICAL_OINTMENT | Freq: Two times a day (BID) | CUTANEOUS | Status: DC

## 2023-10-20 MED ORDER — ONDANSETRON HCL 4 MG/2ML IJ SOLN
4.0000 mg | Freq: Once | INTRAMUSCULAR | Status: AC
  Administered 2023-10-20: 4 mg via INTRAVENOUS
  Filled 2023-10-20: qty 2
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
## 2023-10-20 NOTE — ED Provider Notes (Addendum)
 [place] EMERGENCY DEPARTMENT AT [facility] Provider Note   CSN: [id number] Arrival date & time: 10/20/23  2044     History  Chief Complaint  Patient presents with   Abdominal Pain    Dana Lane is a 28 y.o. female past medical history significant for abdominal pain, constipation, anxiety, GERD, and obesity presents today for right lower quadrant pain that she states began around 3 PM today.  Patient denies nausea, vomiting, diarrhea, constipation, fever, chills, shortness of breath, chest pain, or any previous abdominal surgeries.   Abdominal Pain      Home Medications Prior to Admission medications   Medication Sig Start Date End Date Taking? Authorizing Provider  amoxicillin -clavulanate (AUGMENTIN ) 875-125 MG tablet Take 1 tablet by mouth 2 (two) times daily. One po bid x 7 days 10/28/21   Carlton Chick, MD  busPIRone  (BUSPAR ) 15 MG tablet Take 1 tablet (15 mg total) by mouth 2 (two) times daily. 02/19/21   Luane Rumps, PA-C  famotidine  (PEPCID ) 20 MG tablet Take 1 tablet (20 mg total) by mouth 2 (two) times daily for 7 days. 01/06/22 01/13/22  Volney Grumbles, PA-C  guaiFENesin  (ROBITUSSIN) 100 MG/5ML liquid Take 5-10 mLs (100-200 mg total) by mouth every 4 (four) hours as needed for cough or to loosen phlegm. 06/05/21   Autry, Lauren E, PA-C  loratadine  (CLARITIN ) 10 MG tablet Take 1 tablet (10 mg total) by mouth daily for 7 days. 01/06/22 01/13/22  Volney Grumbles, PA-C  methocarbamol  (ROBAXIN ) 500 MG tablet Take 1 tablet (500 mg total) by mouth 2 (two) times daily as needed for muscle spasms. 03/28/22   Early Glisson, MD  naproxen  (NAPROSYN ) 500 MG tablet Take 1 tablet (500 mg total) by mouth 2 (two) times daily with a meal. 03/28/22   Early Glisson, MD  omeprazole  (PRILOSEC) 40 MG capsule Take 40mg  daily before breakfast and take additional dose before supper if needed. 01/23/21   Lanney Pitts, PA-C  Prenatal Vit-Fe Fumarate-FA (MULTIVITAMIN-PRENATAL)  27-0.8 MG TABS tablet Take 1 tablet by mouth daily at 12 noon.    [provider]      Allergies    Poison oak extract [poison oak extract], Chocolate, and Egg-derived products    Review of Systems   Review of Systems  Gastrointestinal:  Positive for abdominal pain.    Physical Exam Updated Vital Signs BP (!) 128/91   Pulse 88   Temp 99.3 F (37.4 C) (Oral)   Resp 17   Ht 5\' 4"  (1.626 m)   Wt 110 kg   SpO2 100%   BMI 41.64 kg/m  Physical Exam Vitals and nursing note reviewed.  Constitutional:      General: She is not in acute distress.    Appearance: She is well-developed. She is obese. She is not ill-appearing or toxic-appearing.  HENT:     Head: Normocephalic and atraumatic.     Mouth/Throat:     Mouth: Mucous membranes are moist.  Eyes:     Conjunctiva/sclera: Conjunctivae normal.  Cardiovascular:     Rate and Rhythm: Normal rate and regular rhythm.     Heart sounds: Normal heart sounds. No murmur heard. Pulmonary:     Effort: Pulmonary effort is normal. No respiratory distress.     Breath sounds: Normal breath sounds.  Abdominal:     General: There is no distension.     Palpations: Abdomen is soft.     Tenderness: There is abdominal tenderness in the right lower quadrant.  Positive signs include McBurney's sign. Negative signs include Murphy's sign and Rovsing's sign.  Musculoskeletal:        General: No swelling.     Cervical back: Neck supple.  Skin:    General: Skin is warm and dry.     Capillary Refill: Capillary refill takes less than 2 seconds.  Neurological:     General: No focal deficit present.     Mental Status: She is alert.  Psychiatric:        Mood and Affect: Mood normal.     ED Results / Procedures / Treatments   Labs (all labs ordered are listed, but only abnormal results are displayed) Labs Reviewed  CBC WITH DIFFERENTIAL/PLATELET - Abnormal; Notable for the following components:      Result Value   WBC 14.1 (*)    Neutro  Abs 11.1 (*)    All other components within normal limits  COMPREHENSIVE METABOLIC PANEL WITH GFR - Abnormal; Notable for the following components:   Glucose, Bld 135 (*)    ALT 45 (*)    All other components within normal limits  LIPASE, BLOOD  HCG, SERUM, QUALITATIVE  URINALYSIS, ROUTINE W REFLEX MICROSCOPIC    EKG None  Radiology CT ABDOMEN PELVIS W CONTRAST Result Date: 10/20/2023 CLINICAL DATA:  Right lower quadrant abdominal pain. EXAM: CT ABDOMEN AND PELVIS WITH CONTRAST TECHNIQUE: Multidetector CT imaging of the abdomen and pelvis was performed using the standard protocol following bolus administration of intravenous contrast. RADIATION DOSE REDUCTION: This exam was performed according to the departmental dose-optimization program which includes automated exposure control, adjustment of the mA and/or kV according to patient size and/or use of iterative reconstruction technique. CONTRAST:  OMNIPAQUE  IOHEXOL  300 MG/ML  SOLN COMPARISON:  Abdominal radiographs 03/21/2020. CT abdomen and pelvis 05/23/2019 FINDINGS: Lower chest: Lung bases are clear. Hepatobiliary: Mild diffuse fatty infiltration of the liver. No focal lesions. Gallbladder and bile ducts are normal. Pancreas: Unremarkable. No pancreatic ductal dilatation or surrounding inflammatory changes. Spleen: Normal in size without focal abnormality. Adrenals/Urinary Tract: Adrenal glands are unremarkable. Kidneys are normal, without renal calculi, focal lesion, or hydronephrosis. Bladder is unremarkable. Stomach/Bowel: Stomach, small bowel, and colon are not abnormally distended. Mildly distended fluid-filled appendix with some wall thickening. Appendiceal diameter measures 1.1 cm. Periappendiceal stranding. Changes are consistent with early acute appendicitis. No abscess. The appendix is located posterolaterally to the cecum in the right lower quadrant. Vascular/Lymphatic: No significant vascular findings are present. No enlarged  abdominal or pelvic lymph nodes. Reproductive: Uterus and bilateral adnexa are unremarkable. Other: No abdominal wall hernia or abnormality. No abdominopelvic ascites. Musculoskeletal: No acute or significant osseous findings. IMPRESSION: 1. Acute appendicitis with appendiceal diameter measuring 1.1 cm and periappendiceal stranding present. No appendicolith or abscess. 2. No evidence of bowel obstruction. 3. Fatty infiltration of the liver. Electronically Signed   By: Boyce Byes M.D.   On: 10/20/2023 22:22    Procedures Procedures    Medications Ordered in ED Medications  piperacillin -tazobactam (ZOSYN ) IVPB 3.375 g (has no administration in time range)  morphine  (PF) 2 MG/ML injection 2 mg (2 mg Intravenous Given 10/20/23 2136)  ondansetron  (ZOFRAN ) injection 4 mg (4 mg Intravenous Given 10/20/23 2136)  iohexol  (OMNIPAQUE ) 300 MG/ML solution 100 mL (100 mLs Intravenous Contrast Given 10/20/23 2208)  morphine  (PF) 2 MG/ML injection 2 mg (2 mg Intravenous Given 10/20/23 2234)    ED Course/ Medical Decision Making/ A&P  Medical Decision Making Amount and/or Complexity of Data Reviewed Labs: ordered. Radiology: ordered.  Risk Prescription drug management. Decision regarding hospitalization.   This patient presents to the ED for concern of abdominal pain differential diagnosis includes appendicitis, pancreatitis, choledocholithiasis, acute cholecystitis, viral GI illness, SBO, diverticulitis   Lab Tests:  I Ordered, and personally interpreted labs.  The pertinent results include: Normally elevated ALT, [place] ptosis at 14.1 with left shift, serum pregnancy negative   Imaging Studies ordered:  I ordered imaging studies including CT abdomen pelvis with contrast I independently visualized and interpreted imaging which showed acute appendicitis with appendiceal diameter measuring 1.1 cm and periappendiceal stranding present.  No appendicolith or  abscess.  No evidence of bowel obstruction. I agree with the radiologist interpretation   Medicines ordered and prescription drug management:  I ordered medication including Zosyn , morphine , Zofran  for infection, pain, nausea Reevaluation of the patient after these medicines showed that the patient improved I have reviewed the patients home medicines and have made adjustments as needed   General Surgery, Dr. Bonner Butler who is agreeable to see the patient and likely take to the OR tomorrow. Hospitalist, Dr. Jeannene Milling who is agreeable to admission for acute appendicitis.        Final Clinical Impression(s) / ED Diagnoses Final diagnoses:  Other acute appendicitis    Rx / DC Orders ED Discharge Orders     None         Carie Charity, PA-C 10/20/23 2241    Carie Charity, PA-C 10/20/23 2244    Mordecai Applebaum, MD 10/22/23 [phone]

## 2023-10-20 NOTE — ED Notes (Signed)
 Pt ambulatory to bathroom

## 2023-10-20 NOTE — ED Notes (Signed)
 Pt in CT at this time.

## 2023-10-21 ENCOUNTER — Inpatient Hospital Stay (HOSPITAL_COMMUNITY): Payer: Self-pay | Admitting: Anesthesiology

## 2023-10-21 ENCOUNTER — Other Ambulatory Visit: Payer: Self-pay

## 2023-10-21 ENCOUNTER — Encounter (HOSPITAL_COMMUNITY)
Admission: EM | Disposition: A | Discharge status: Home / Self Care | Payer: Self-pay | Source: Home / Self Care | Attending: General Surgery

## 2023-10-21 ENCOUNTER — Encounter (HOSPITAL_COMMUNITY): Payer: Self-pay | Admitting: Internal Medicine

## 2023-10-21 DIAGNOSIS — K353 Acute appendicitis with localized peritonitis, without perforation or gangrene: Secondary | ICD-10-CM

## 2023-10-21 DIAGNOSIS — K3580 Unspecified acute appendicitis: Secondary | ICD-10-CM

## 2023-10-21 HISTORY — PX: XI ROBOTIC LAPAROSCOPIC ASSISTED APPENDECTOMY: SHX6877

## 2023-10-21 LAB — COMPREHENSIVE METABOLIC PANEL WITH GFR
ALT: 36 U/L (ref 0–44)
AST: 16 U/L (ref 15–41)
Albumin: 3.8 g/dL (ref 3.5–5.0)
Alkaline Phosphatase: 58 U/L (ref 38–126)
Anion gap: 7 (ref 5–15)
BUN: 12 mg/dL (ref 6–20)
CO2: 24 mmol/L (ref 22–32)
Calcium: 8.4 mg/dL — ABNORMAL LOW (ref 8.9–10.3)
Chloride: 104 mmol/L (ref 98–111)
Creatinine, Ser: 0.73 mg/dL (ref 0.44–1.00)
GFR, Estimated: >60 mL/min (ref >60)
Glucose, Bld: 136 mg/dL — ABNORMAL HIGH (ref 70–99)
Potassium: 3.8 mmol/L (ref 3.5–5.1)
Sodium: 135 mmol/L (ref 135–145)
Total Bilirubin: 0.7 mg/dL (ref 0.0–1.2)
Total Protein: 6.2 g/dL — ABNORMAL LOW (ref 6.5–8.1)

## 2023-10-21 LAB — URINALYSIS, ROUTINE W REFLEX MICROSCOPIC
Bilirubin Urine: NEGATIVE
Glucose, UA: NEGATIVE mg/dL
Hgb urine dipstick: NEGATIVE
Ketones, ur: NEGATIVE mg/dL
Leukocytes,Ua: NEGATIVE
Nitrite: NEGATIVE
Protein, ur: NEGATIVE mg/dL
Specific Gravity, Urine: >1.046 — ABNORMAL HIGH (ref 1.005–1.030)
pH: 5 (ref 5.0–8.0)

## 2023-10-21 LAB — PROTIME-INR
INR: 1 (ref 0.8–1.2)
Prothrombin Time: 13.8 s (ref 11.4–15.2)

## 2023-10-21 LAB — CBC
HCT: 40 % (ref 36.0–46.0)
Hemoglobin: 13.1 g/dL (ref 12.0–15.0)
MCH: 30.1 pg (ref 26.0–34.0)
MCHC: 32.8 g/dL (ref 30.0–36.0)
MCV: 92 fL (ref 80.0–100.0)
Platelets: 238 10*3/uL (ref 150–400)
RBC: 4.35 MIL/uL (ref 3.87–5.11)
RDW: 12.4 % (ref 11.5–15.5)
WBC: 11.6 10*3/uL — ABNORMAL HIGH (ref 4.0–10.5)
nRBC: 0 % (ref 0.0–0.2)

## 2023-10-21 LAB — SURGICAL PCR SCREEN
MRSA, PCR: NEGATIVE
Staphylococcus aureus: NEGATIVE

## 2023-10-21 SURGERY — APPENDECTOMY, ROBOT-ASSISTED, LAPAROSCOPIC
Anesthesia: General | Site: Abdomen

## 2023-10-21 MED ORDER — KETOROLAC TROMETHAMINE 30 MG/ML IJ SOLN
30.0000 mg | Freq: Once | INTRAMUSCULAR | Status: AC
  Administered 2023-10-21: 30 mg via INTRAVENOUS

## 2023-10-21 MED ORDER — FENTANYL CITRATE (PF) 100 MCG/2ML IJ SOLN
INTRAMUSCULAR | Status: DC | PRN
  Administered 2023-10-21 (×2): 50 ug via INTRAVENOUS

## 2023-10-21 MED ORDER — PHENYLEPHRINE 80 MCG/ML (10ML) SYRINGE FOR IV PUSH (FOR BLOOD PRESSURE SUPPORT)
PREFILLED_SYRINGE | INTRAVENOUS | Status: AC
Start: 2023-10-21 — End: ?
  Filled 2023-10-21: qty 10

## 2023-10-21 MED ORDER — OXYCODONE HCL 5 MG PO TABS
5.0000 mg | ORAL_TABLET | Freq: Once | ORAL | Status: AC | PRN
  Administered 2023-10-21: 5 mg via ORAL
  Filled 2023-10-21: qty 1

## 2023-10-21 MED ORDER — ACETAMINOPHEN 650 MG RE SUPP: 650.0000 mg | Freq: Four times a day (QID) | RECTAL | Status: DC | PRN

## 2023-10-21 MED ORDER — LIDOCAINE 2% (20 MG/ML) 5 ML SYRINGE
INTRAMUSCULAR | Status: AC
  Filled 2023-10-21: qty 5

## 2023-10-21 MED ORDER — BUSPIRONE HCL 15 MG PO TABS
15.0000 mg | ORAL_TABLET | Freq: Two times a day (BID) | ORAL | Status: DC
  Filled 2023-10-21 (×4): qty 1

## 2023-10-21 MED ORDER — OXYCODONE HCL 5 MG PO TABS
5.0000 mg | ORAL_TABLET | ORAL | Status: DC | PRN
  Administered 2023-10-21 – 2023-10-22 (×3): 5 mg via ORAL
  Filled 2023-10-21 (×3): qty 1

## 2023-10-21 MED ORDER — ONDANSETRON HCL 4 MG/2ML IJ SOLN: 4.0000 mg | Freq: Four times a day (QID) | INTRAMUSCULAR | Status: DC | PRN

## 2023-10-21 MED ORDER — LACTATED RINGERS IV SOLN: INTRAVENOUS | Status: DC

## 2023-10-21 MED ORDER — MORPHINE SULFATE (PF) 2 MG/ML IV SOLN
2.0000 mg | INTRAVENOUS | Status: DC | PRN
  Administered 2023-10-21 (×5): 2 mg via INTRAVENOUS
  Filled 2023-10-21 (×5): qty 1

## 2023-10-21 MED ORDER — ROCURONIUM BROMIDE 100 MG/10ML IV SOLN
INTRAVENOUS | Status: DC | PRN
  Administered 2023-10-21: 70 mg via INTRAVENOUS

## 2023-10-21 MED ORDER — SUGAMMADEX SODIUM 200 MG/2ML IV SOLN
INTRAVENOUS | Status: DC | PRN
  Administered 2023-10-21: 200 mg via INTRAVENOUS

## 2023-10-21 MED ORDER — SCOPOLAMINE 1 MG/3DAYS TD PT72: 1.0000 | MEDICATED_PATCH | Freq: Once | TRANSDERMAL | Status: DC

## 2023-10-21 MED ORDER — LIDOCAINE 2% (20 MG/ML) 5 ML SYRINGE
INTRAMUSCULAR | Status: DC | PRN
Start: 2023-10-21 — End: 2023-10-21
  Administered 2023-10-21: 75 mg via INTRAVENOUS

## 2023-10-21 MED ORDER — STERILE WATER FOR IRRIGATION IR SOLN
Status: DC | PRN
  Administered 2023-10-21: 500 mL

## 2023-10-21 MED ORDER — BUPIVACAINE HCL (PF) 0.5 % IJ SOLN
INTRAMUSCULAR | Status: AC
  Filled 2023-10-21: qty 30

## 2023-10-21 MED ORDER — FENTANYL CITRATE (PF) 100 MCG/2ML IJ SOLN
INTRAMUSCULAR | Status: AC
  Filled 2023-10-21: qty 2

## 2023-10-21 MED ORDER — PHENOL 1.4 % MT LIQD: 1.0000 | OROMUCOSAL | Status: DC | PRN

## 2023-10-21 MED ORDER — ACETAMINOPHEN 325 MG PO TABS: 650.0000 mg | ORAL_TABLET | Freq: Four times a day (QID) | ORAL | Status: DC | PRN

## 2023-10-21 MED ORDER — MIDAZOLAM HCL 2 MG/2ML IJ SOLN
INTRAMUSCULAR | Status: DC | PRN
  Administered 2023-10-21: 2 mg via INTRAVENOUS

## 2023-10-21 MED ORDER — ORAL CARE MOUTH RINSE: 15.0000 mL | Freq: Once | OROMUCOSAL | Status: DC

## 2023-10-21 MED ORDER — ONDANSETRON HCL 4 MG/2ML IJ SOLN
INTRAMUSCULAR | Status: DC | PRN
  Administered 2023-10-21: 4 mg via INTRAVENOUS

## 2023-10-21 MED ORDER — DEXAMETHASONE SODIUM PHOSPHATE 10 MG/ML IJ SOLN
INTRAMUSCULAR | Status: DC | PRN
  Administered 2023-10-21: 8 mg via INTRAVENOUS

## 2023-10-21 MED ORDER — ONDANSETRON HCL 4 MG/2ML IJ SOLN
INTRAMUSCULAR | Status: AC
  Filled 2023-10-21: qty 2

## 2023-10-21 MED ORDER — MIDAZOLAM HCL 2 MG/2ML IJ SOLN
INTRAMUSCULAR | Status: AC
  Filled 2023-10-21: qty 2

## 2023-10-21 MED ORDER — ONDANSETRON HCL 4 MG PO TABS: 4.0000 mg | ORAL_TABLET | Freq: Four times a day (QID) | ORAL | Status: DC | PRN

## 2023-10-21 MED ORDER — ENOXAPARIN SODIUM 40 MG/0.4ML IJ SOSY
40.0000 mg | PREFILLED_SYRINGE | INTRAMUSCULAR | Status: DC
  Administered 2023-10-21: 40 mg via SUBCUTANEOUS
  Filled 2023-10-21: qty 0.4

## 2023-10-21 MED ORDER — LACTATED RINGERS IV SOLN: INTRAVENOUS | Status: DC | PRN

## 2023-10-21 MED ORDER — PHENYLEPHRINE 80 MCG/ML (10ML) SYRINGE FOR IV PUSH (FOR BLOOD PRESSURE SUPPORT)
PREFILLED_SYRINGE | INTRAVENOUS | Status: DC | PRN
Start: 2023-10-21 — End: 2023-10-21
  Administered 2023-10-21: 80 ug via INTRAVENOUS

## 2023-10-21 MED ORDER — PROPOFOL 10 MG/ML IV BOLUS
INTRAVENOUS | Status: DC | PRN
  Administered 2023-10-21: 160 mg via INTRAVENOUS

## 2023-10-21 MED ORDER — SODIUM CHLORIDE 0.9 % IV SOLN
2.0000 g | INTRAVENOUS | Status: DC
  Filled 2023-10-21: qty 2

## 2023-10-21 MED ORDER — CHLORHEXIDINE GLUCONATE 0.12 % MT SOLN
15.0000 mL | Freq: Once | OROMUCOSAL | Status: AC
  Administered 2023-10-21: 15 mL via OROMUCOSAL

## 2023-10-21 MED ORDER — DEXAMETHASONE SODIUM PHOSPHATE 10 MG/ML IJ SOLN
INTRAMUSCULAR | Status: AC
Start: 2023-10-21 — End: ?
  Filled 2023-10-21: qty 1

## 2023-10-21 MED ORDER — CHLORHEXIDINE GLUCONATE CLOTH 2 % EX PADS
6.0000 | MEDICATED_PAD | Freq: Once | CUTANEOUS | Status: AC
  Administered 2023-10-21: 6 via TOPICAL

## 2023-10-21 MED ORDER — BUPIVACAINE HCL (PF) 0.5 % IJ SOLN
INTRAMUSCULAR | Status: DC | PRN
  Administered 2023-10-21: 30 mL

## 2023-10-21 MED ORDER — CHLORHEXIDINE GLUCONATE 0.12 % MT SOLN: 15.0000 mL | Freq: Once | OROMUCOSAL | Status: DC

## 2023-10-21 MED ORDER — OXYCODONE HCL 5 MG/5ML PO SOLN: 5.0000 mg | Freq: Once | ORAL | Status: AC | PRN

## 2023-10-21 MED ORDER — ROCURONIUM BROMIDE 10 MG/ML (PF) SYRINGE
PREFILLED_SYRINGE | INTRAVENOUS | Status: AC
  Filled 2023-10-21: qty 10

## 2023-10-21 MED ORDER — SODIUM CHLORIDE 0.9 % IV SOLN: 12.5000 mg | INTRAVENOUS | Status: DC | PRN

## 2023-10-21 MED ORDER — HYDROMORPHONE HCL 1 MG/ML IJ SOLN
0.2500 mg | INTRAMUSCULAR | Status: DC | PRN
  Administered 2023-10-21 (×3): 0.5 mg via INTRAVENOUS
  Filled 2023-10-21 (×3): qty 0.5

## 2023-10-21 MED ORDER — KETOROLAC TROMETHAMINE 30 MG/ML IJ SOLN
INTRAMUSCULAR | Status: AC
  Filled 2023-10-21: qty 1

## 2023-10-21 SURGICAL SUPPLY — 38 items
BLADE SURG 15 STRL LF DISP TIS (BLADE) ×1 IMPLANT
CHLORAPREP W/TINT 26 (MISCELLANEOUS) ×1 IMPLANT
COVER TIP SHEARS 8 DVNC (MISCELLANEOUS) ×1 IMPLANT
DERMABOND ADVANCED .7 DNX12 (GAUZE/BANDAGES/DRESSINGS) ×1 IMPLANT
DRAPE ARM DVNC X/XI (DISPOSABLE) ×3 IMPLANT
DRAPE COLUMN DVNC XI (DISPOSABLE) ×1 IMPLANT
ELECTRODE REM PT RTRN 9FT ADLT (ELECTROSURGICAL) ×1 IMPLANT
GLOVE BIO SURGEON STRL SZ 6.5 (GLOVE) ×2 IMPLANT
GLOVE BIOGEL PI IND STRL 6.5 (GLOVE) ×2 IMPLANT
GLOVE BIOGEL PI IND STRL 7.0 (GLOVE) ×2 IMPLANT
GOWN STRL REUS W/TWL LRG LVL3 (GOWN DISPOSABLE) ×3 IMPLANT
GRASPER SUT TROCAR 14GX15 (MISCELLANEOUS) IMPLANT
KIT PINK PAD W/HEAD ARE REST (MISCELLANEOUS) ×1 IMPLANT
KIT PINK PAD W/HEAD ARM REST (MISCELLANEOUS) ×1 IMPLANT
KIT TURNOVER KIT A (KITS) ×1 IMPLANT
MANIFOLD NEPTUNE II (INSTRUMENTS) ×1 IMPLANT
NDL HYPO 21X1.5 SAFETY (NEEDLE) ×1 IMPLANT
NDL INSUFFLATION 14GA 120MM (NEEDLE) ×1 IMPLANT
NEEDLE HYPO 21X1.5 SAFETY (NEEDLE) ×1 IMPLANT
NEEDLE INSUFFLATION 14GA 120MM (NEEDLE) ×1 IMPLANT
OBTURATOR OPTICALSTD 8 DVNC (TROCAR) ×1 IMPLANT
PACK LAP CHOLE LZT030E (CUSTOM PROCEDURE TRAY) ×1 IMPLANT
PAD ARMBOARD POSITIONER FOAM (MISCELLANEOUS) ×1 IMPLANT
PENCIL ELECTRO HAND CTR (MISCELLANEOUS) ×1 IMPLANT
RELOAD STAPLE 45 2.5 WHT DVNC (STAPLE) ×1 IMPLANT
RELOAD STAPLER 2.5X45 WHT DVNC (STAPLE) ×1 IMPLANT
SEAL UNIV 5-12 XI (MISCELLANEOUS) ×3 IMPLANT
SEALER VESSEL EXT DVNC XI (MISCELLANEOUS) IMPLANT
SET BASIN LINEN APH (SET/KITS/TRAYS/PACK) ×1 IMPLANT
SET TUBE DA VINCI INSUFFLATOR (TUBING) IMPLANT
STAPLER 45 SUREFORM DVNC (STAPLE) ×1 IMPLANT
SUT MNCRL AB 4-0 PS2 18 (SUTURE) ×2 IMPLANT
SUT VICRYL 0 UR6 27IN ABS (SUTURE) ×1 IMPLANT
SYR 30ML LL (SYRINGE) ×1 IMPLANT
SYSTEM BAG RETRIEVAL 10MM (BASKET) IMPLANT
TAPE TRANSPORE STRL 2 31045 (GAUZE/BANDAGES/DRESSINGS) ×1 IMPLANT
TRAY FOLEY MTR SLVR 16FR STAT (SET/KITS/TRAYS/PACK) ×1 IMPLANT
WATER STERILE IRR 500ML POUR (IV SOLUTION) ×1 IMPLANT

## 2023-10-21 NOTE — Anesthesia Postprocedure Evaluation (Signed)
 Anesthesia Post Note  Patient: Dana Lane  Procedure(s) Performed: APPENDECTOMY, ROBOT-ASSISTED, LAPAROSCOPIC (Abdomen)  Patient location during evaluation: PACU Anesthesia Type: General Level of consciousness: awake and alert Pain management: pain level controlled Vital Signs Assessment: post-procedure vital signs reviewed and stable Respiratory status: spontaneous breathing, nonlabored ventilation, respiratory function stable and patient connected to nasal cannula oxygen Cardiovascular status: blood pressure returned to baseline and stable Postop Assessment: no apparent nausea or vomiting Anesthetic complications: no   There were no known notable events for this encounter.   Last Vitals:  Vitals:   10/21/23 1330 10/21/23 1345  BP: 121/78 122/69  Pulse: 70 81  Resp: 14 16  Temp:    SpO2: 100% 95%    Last Pain:  Vitals:   10/21/23 1345  TempSrc:   PainSc: Asleep                 Paxton Binns L Gustava Berland

## 2023-10-21 NOTE — Anesthesia Preprocedure Evaluation (Addendum)
 Anesthesia Evaluation  Patient identified by MRN, date of birth, ID band Patient awake    Reviewed: Allergy & Precautions, H&P , NPO status , Patient's Chart, lab work & pertinent test results, reviewed documented beta blocker date and time   History of Anesthesia Complications (+) PONV and history of anesthetic complications  Airway Mallampati: I  TM Distance: >3 FB Neck ROM: Full    Dental no notable dental hx. (+) Teeth Intact, Dental Advisory Given   Pulmonary neg pulmonary ROS   Pulmonary exam normal breath sounds clear to auscultation       Cardiovascular Exercise Tolerance: Good negative cardio ROS Normal cardiovascular examI Rhythm:Regular Rate:Normal     Neuro/Psych  PSYCHIATRIC DISORDERS Anxiety Depression    Anxiety disordernegative neurological ROS     GI/Hepatic negative GI ROS, Neg liver ROS,GERD  Medicated and Controlled,,Non-alcoholic fatty liver disease   Endo/Other  prediabetes  Renal/GU negative Renal ROS  negative genitourinary   Musculoskeletal negative musculoskeletal ROS (+)    Abdominal   Peds negative pediatric ROS (+)  Hematology negative hematology ROS (+)   Anesthesia Other Findings   Reproductive/Obstetrics negative OB ROS                             Anesthesia Physical Anesthesia Plan  ASA: 2  Anesthesia Plan: General   Post-op Pain Management: Dilaudid  IV   Induction: Intravenous and Rapid sequence  PONV Risk Score and Plan: Ondansetron , Dexamethasone , Midazolam  and Scopolamine  patch - Pre-op  Airway Management Planned: Oral ETT  Additional Equipment:   Intra-op Plan:   Post-operative Plan: Extubation in OR  Informed Consent: I have reviewed the patients History and Physical, chart, labs and discussed the procedure including the risks, benefits and alternatives for the proposed anesthesia with the patient or authorized representative who has  indicated his/her understanding and acceptance.     Dental advisory given  Plan Discussed with: CRNA and Surgeon  Anesthesia Plan Comments:         Anesthesia Quick Evaluation

## 2023-10-21 NOTE — Progress Notes (Signed)
 Dana Lane is a 28 y.o. female with medical history significant of depression, GERD who presents emergency department with abdominal pain.  She has been admitted with acute appendicitis and started on IV fluids and Zosyn as well as pain management.  General surgery planning for operative intervention today.   Patient has been admitted after midnight and has been seen and evaluated at bedside with labs reviewed.  Continues to have abdominal pain this morning with minimal nausea and vomiting.  Hospitalist team will be available to follow as needed moving forward.  Total care time: 15 minutes.

## 2023-10-21 NOTE — Op Note (Signed)
 Preoperative diagnosis: Acute appendicitis  Postoperative diagnosis: Same  Procedure: Robotic assisted laparoscopic appendectomy.  Anesthesia: General   Surgeon: Deena Farrier, MD   Wound Classification: Contamianted   Specimen: Appendix  Complications: None  Estimated Blood Loss: Minimal    Indications: Patient is a 28 y.o. female  presented with acute appendicitis on CT. The risk of surgery were explained to the patient including but not limited to bleeding, infection, finding a rupture, injury to other organs, needing to do an open procedure.   FIndings: Dilated inflamed appendix  Upon entering the abdomen (organ space), I encountered infection of the appendix with inflammation and dilation and fibrinous tissue forming .   Description of procedure: The patient was placed on the operating table in the supine position, left arm tucked. General anesthesia was induced. A time-out was completed verifying correct patient, procedure, site, positioning, and implant(s) and/or special equipment prior to beginning this procedure. A foley and OG were in place. The abdomen was prepped and draped in the usual sterile fashion.   At Palmer's point an incision was made and Veress needle was inserted.  After confirming intraabdominal location with positive saline drop test, we tried to get insufflation but the pressure rose rapidly. I then opted to go to the supraumbilical area and confirmed intraabdominal location with a positive saline drop test and low insufflation pressures, gas insufflation was initiated until the abdominal pressure was measured at 15 mmHg.  Palmer's point stab incision was extended and a 8mm port was placed under direct visualization. There was no injury in this area or beneath the Veress needle. Two additional incision was made 8 cm apart each side along the left side of the abdominal wall from the initial incision.  An 8 mm port was place in the left lower and left abdomen and  the initial Palmers' poin was upsized to a 12mm port; all were placed under direct visualization.  No injuries from trocar placements were noted. The patient was placed in Trendelenburg position with the right side elevated.  The robotic platform was then brought to the operative field and docked. A vessel sealer was placed through the 12 mm port and a forced bipolar through the lower 8 mm port.   Upon entering the abdomen (organ space), I encountered infection of the  appendix with inflammation and dilation and fibrinous tissue forming . An appendix that appeared dilated and inflamed was identified and elevated.  Infection was present within the abdominal cavity due to appendicitis. Window created at base of appendix in the mesentery.  A white load linear cutting stapler was placed through the 12 mm port, and then used to divide and staple the base of the appendix. The vessel sealer was used to ligate the mesoappendix. No bleeding from the staple lines noted.  The appendix was placed in an endoscopic retrieval bag and removed through the 12 mm port.   The staple line on the colon and mesoappendix staple line examined again and hemostasis noted. No other pathology was identified within pelvis. The 12 mm trocar removed and port site closed with PMI using 0 vicryl under direct vision. Remaining trocars were removed. No bleeding was noted.The abdomen was allowed to collapse. All skin incisions then closed with subcuticular sutures Monocryl 4-0.  Wounds then dressed with dermabond.  The patient tolerated the procedure well, awakened from anesthesia and was taken to the postanesthesia care unit in satisfactory condition.  Sponge count and instrument count correct at the end of the procedure. The  foley was removed.   Deena Farrier, MD [facility] [address] [place], [place] [address] [phone] (office)

## 2023-10-21 NOTE — H&P (Addendum)
 History and Physical    Dana Lane NWG:[id number] DOB: [birth year] DOA: 10/20/2023  PCP: Patient, No Pcp Per   Chief Complaint: Abdominal pain  HPI: Dana Lane is a 28 y.o. female with medical history significant of depression, GERD who presents emergency department with abdominal pain.  Patient had acute onset pain this afternoon that was severe in nature.  She went to the ER she was found to be afebrile and hemodynamically stable.  Labs were obtained on presentation which showed WBC 14.1, hemoglobin 13.9, CMP unrevealing, lipase 39.  Patient underwent CT abdomen pelvis which showed acute appendicitis.  Surgery was consulted with recommendations for admission for appendectomy in the morning.  On evaluation patient was resting comfortably and pain well-controlled with morphine.   Review of Systems: Review of Systems  Constitutional: Negative.  Negative for chills and fever.  HENT: Negative.    Eyes: Negative.   Respiratory: Negative.    Cardiovascular: Negative.   Gastrointestinal:  Positive for abdominal pain.  Genitourinary: Negative.   Musculoskeletal: Negative.   Skin: Negative.   Neurological: Negative.   Endo/Heme/Allergies: Negative.   Psychiatric/Behavioral: Negative.    All other systems reviewed and are negative.    As per HPI otherwise 10 point review of systems negative.   Allergies  Allergen Reactions   Poison Oak Extract [Poison Oak Extract] Itching and Rash   Chocolate Itching and Swelling   Egg-Derived Products Diarrhea and Other (See Comments)    High fever    Past Medical History:  Diagnosis Date   Abdominal pain 03/21/2020   Anemia    Ankle sprain    right   Anxiety 10/20/2023   Chest wall pain 09/14/2015   Constipation 03/21/2020   Depression    Fatty liver disease, nonalcoholic    Gastroesophageal reflux disease 06/18/2015   Generalized anxiety disorder 02/26/2023   GERD (gastroesophageal reflux disease)    Major depressive disorder,  recurrent, moderate (HCC) 03/25/2018   Nausea without vomiting 09/19/2020   Odynophagia 01/23/2021   Pain of upper abdomen 03/17/2019   Patient desires pregnancy 05/05/2019   Pre-diabetes    Rectal bleeding 03/17/2019   Tonsillitis     Past Surgical History:  Procedure Laterality Date   BIOPSY  05/02/2019   Procedure: BIOPSY;  Surgeon: Suzette Espy, MD;  [facility]: [facility];  Service: Endoscopy;;  gastric polyps    COLONOSCOPY WITH PROPOFOL  N/A 05/02/2019   Rourk: Normal exam, next colonoscopy at initial screening age, 29   ESOPHAGOGASTRODUODENOSCOPY (EGD) WITH PROPOFOL  N/A 05/02/2019   Rourk: Multiple gastric polyps, biopsy showed fundic gland polyp.  Gastric biopsy showed chronic inactive gastritis, no H. pylori.   EYE SURGERY  28yo   TONSILLECTOMY AND ADENOIDECTOMY Bilateral 11/16/2018   Procedure: TONSILLECTOMY AND ADENOIDECTOMY;  Surgeon: Reynold Caves, MD;  Location: [facility];  Service: ENT;  Laterality: Bilateral;     reports that she has never smoked. She has never used smokeless tobacco. She reports that she does not currently use alcohol. She reports that she does not use drugs.  Family History  Problem Relation Age of Onset   Diabetes Mother    Cervical cancer Mother        ? patient isn't sure. Thinks mom had this when she was younger (<50 y.o.)   Cancer Father        prostate   Depression Sister    Cancer Sister        bladder   Heart disease Paternal Grandfather  Colon polyps Neg Hx    Colon cancer Neg Hx     Prior to Admission medications   Medication Sig Start Date End Date Taking? Authorizing Provider  amoxicillin -clavulanate (AUGMENTIN ) 875-125 MG tablet Take 1 tablet by mouth 2 (two) times daily. One po bid x 7 days 10/28/21   Carlton Chick, MD  busPIRone  (BUSPAR ) 15 MG tablet Take 1 tablet (15 mg total) by mouth 2 (two) times daily. 02/19/21   Luane Rumps, PA-C  famotidine  (PEPCID ) 20 MG tablet Take 1 tablet (20 mg total) by mouth  2 (two) times daily for 7 days. 01/06/22 01/13/22  Volney Grumbles, PA-C  guaiFENesin  (ROBITUSSIN) 100 MG/5ML liquid Take 5-10 mLs (100-200 mg total) by mouth every 4 (four) hours as needed for cough or to loosen phlegm. 06/05/21   Autry, Lauren E, PA-C  loratadine  (CLARITIN ) 10 MG tablet Take 1 tablet (10 mg total) by mouth daily for 7 days. 01/06/22 01/13/22  Volney Grumbles, PA-C  methocarbamol  (ROBAXIN ) 500 MG tablet Take 1 tablet (500 mg total) by mouth 2 (two) times daily as needed for muscle spasms. 03/28/22   Early Glisson, MD  naproxen  (NAPROSYN ) 500 MG tablet Take 1 tablet (500 mg total) by mouth 2 (two) times daily with a meal. 03/28/22   Early Glisson, MD  omeprazole  (PRILOSEC) 40 MG capsule Take 40mg  daily before breakfast and take additional dose before supper if needed. 01/23/21   Lanney Pitts, PA-C  Prenatal Vit-Fe Fumarate-FA (MULTIVITAMIN-PRENATAL) 27-0.8 MG TABS tablet Take 1 tablet by mouth daily at 12 noon.    [provider]    Physical Exam: Vitals:   10/20/23 2049 10/20/23 2050 10/20/23 2228 10/20/23 2316  BP:  (!) 153/105 (!) 128/91 131/81  Pulse:  (!) 104 88 (!) 103  Resp:  20 17 18   Temp:  99.3 F (37.4 C)  98.1 F (36.7 C)  TempSrc:  Oral  Oral  SpO2:  100% 100% 100%  Weight: 110 kg   109.1 kg  Height: 5\' 4"  (1.626 m)      Physical Exam Constitutional:      Appearance: She is obese.  HENT:     Head: Normocephalic.  Eyes:     Extraocular Movements: Extraocular movements intact.  Cardiovascular:     Rate and Rhythm: Normal rate and regular rhythm.     Heart sounds: Normal heart sounds.  Pulmonary:     Effort: Pulmonary effort is normal.     Breath sounds: Normal breath sounds.  Abdominal:     General: Abdomen is flat. Bowel sounds are normal.     Palpations: Abdomen is soft.  Skin:    General: Skin is warm.     Capillary Refill: Capillary refill takes less than 2 seconds.  Neurological:     General: No focal deficit present.     Mental  Status: She is alert.  Psychiatric:        Mood and Affect: Mood normal.       Labs on Admission: I have personally reviewed the patients's labs and imaging studies.  Assessment/Plan Principal Problem:   Appendicitis   # Acute appendicitis - Presenting with abdominalPain and CT imaging of appendicitis. - Started on Zosyn and IV fluids  Plan: Continue IV fluids Continue Zosyn As needed morphine for pain control Surgical plans in morning so NPO  # Depression-continue BuSpar      Admission status: Inpatient Med-Surg  Certification: The appropriate patient status for this patient is INPATIENT. Inpatient status is  judged to be reasonable and necessary in order to provide the required intensity of service to ensure the patient's safety. The patient's presenting symptoms, physical exam findings, and initial radiographic and laboratory data in the context of their chronic comorbidities is felt to place them at high risk for further clinical deterioration. Furthermore, it is not anticipated that the patient will be medically stable for discharge from the hospital within 2 midnights of admission.   * I certify that at the point of admission it is my clinical judgment that the patient will require inpatient hospital care spanning beyond 2 midnights from the point of admission due to high intensity of service, high risk for further deterioration and high frequency of surveillance required.Myrl Askew MD [facility] If 7PM-7AM, please contact night-coverage [email or web address]  10/21/2023, 12:14 AM

## 2023-10-21 NOTE — Progress Notes (Signed)
   10/21/23 1326  TOC Brief Assessment  Insurance and Status Reviewed  Patient has primary care physician No (Add list to AVS.)  Home environment has been reviewed Home with Spouse  Prior level of function: Independent  Prior/Current Home Services No current home services  Social Drivers of Health Review SDOH reviewed no interventions necessary  Readmission risk has been reviewed Yes  Transition of care needs no transition of care needs at this time   Transition of Care Department [facility]) has reviewed patient and no TOC needs have been identified at this time. We will continue to monitor patient advancement through interdisciplinary progression rounds. If new patient transition needs arise, please place a TOC consult.

## 2023-10-21 NOTE — Progress Notes (Signed)
 [facility]  Left Husband a message. He is at work. If doing well can dc later today.   PRN For pain Diet   Deena Farrier, MD [facility] [address] Anise Barlow [place], [place] [address] [phone] (office)

## 2023-10-21 NOTE — Plan of Care (Signed)
   Problem: Education: Goal: Knowledge of General Education information will improve Description: Including pain rating scale, medication(s)/side effects and non-pharmacologic comfort measures Outcome: Progressing   Problem: Safety: Goal: Ability to remain free from injury will improve Outcome: Progressing   Problem: Skin Integrity: Goal: Risk for impaired skin integrity will decrease Outcome: Progressing

## 2023-10-21 NOTE — Anesthesia Procedure Notes (Signed)
 Procedure Name: Intubation Date/Time: 10/21/2023 11:34 AM  Performed by: Leeanne Puffer, CRNAPre-anesthesia Checklist: Patient identified, Patient being monitored, Timeout performed, Emergency Drugs available and Suction available Patient Re-evaluated:Patient Re-evaluated prior to induction Oxygen Delivery Method: Circle system utilized Preoxygenation: Pre-oxygenation with 100% oxygen Induction Type: IV induction Ventilation: Mask ventilation without difficulty Laryngoscope Size: Mac and 3 Grade View: Grade I Tube type: Oral Tube size: 7.0 mm Number of attempts: 1 Airway Equipment and Method: Stylet Placement Confirmation: ETT inserted through vocal cords under direct vision, positive ETCO2 and breath sounds checked- equal and bilateral Secured at: 21 cm Tube secured with: Tape Dental Injury: Teeth and Oropharynx as per pre-operative assessment

## 2023-10-21 NOTE — Consult Note (Signed)
 [facility] Consult  Reason for Consult: Acute appendicitis  Referring Physician:  Dr. Mason Sole MD   Chief Complaint   Abdominal Pain     HPI: Dana Lane is a 28 y.o. female with a history depression and GERD who presented with acute onset of RLQ pain. She was worked up in the ED and found to have a acute appendicitis with leukocytosis. She was admitted by the hospitalist and antibiotics were given. She was taken over by me this AM. She is still having RLQ pain.  Past Medical History:  Diagnosis Date   Abdominal pain 03/21/2020   Anemia    Ankle sprain    right   Anxiety 10/20/2023   Chest wall pain 09/14/2015   Constipation 03/21/2020   Depression    Fatty liver disease, nonalcoholic    Gastroesophageal reflux disease 06/18/2015   Generalized anxiety disorder 02/26/2023   GERD (gastroesophageal reflux disease)    Major depressive disorder, recurrent, moderate (HCC) 03/25/2018   Nausea without vomiting 09/19/2020   Odynophagia 01/23/2021   Pain of upper abdomen 03/17/2019   Patient desires pregnancy 05/05/2019   Pre-diabetes    Rectal bleeding 03/17/2019   Tonsillitis     Past Surgical History:  Procedure Laterality Date   BIOPSY  05/02/2019   Procedure: BIOPSY;  Surgeon: Suzette Espy, MD;  [facility];  Service: Endoscopy;;  gastric polyps    COLONOSCOPY WITH PROPOFOL  N/A 05/02/2019   Rourk: Normal exam, next colonoscopy at initial screening age, 65   ESOPHAGOGASTRODUODENOSCOPY (EGD) WITH PROPOFOL  N/A 05/02/2019   Rourk: Multiple gastric polyps, biopsy showed fundic gland polyp.  Gastric biopsy showed chronic inactive gastritis, no H. pylori.   EYE SURGERY  28yo   TONSILLECTOMY AND ADENOIDECTOMY Bilateral 11/16/2018   Procedure: TONSILLECTOMY AND ADENOIDECTOMY;  Surgeon: Reynold Caves, MD;  Location: [facility];  Service: ENT;  Laterality: Bilateral;    Family History  Problem Relation Age of Onset   Diabetes Mother     Cervical cancer Mother        ? patient isn't sure. Thinks mom had this when she was younger (<50 y.o.)   Cancer Father        prostate   Depression Sister    Cancer Sister        bladder   Heart disease Paternal Grandfather    Colon polyps Neg Hx    Colon cancer Neg Hx     Social History   Tobacco Use   Smoking status: Never   Smokeless tobacco: Never  Vaping Use   Vaping status: Never Used  Substance Use Topics   Alcohol use: Not Currently   Drug use: No    Medications: I have reviewed the patient's current medications. Prior to Admission:  Medications Prior to Admission  Medication Sig Dispense Refill Last Dose/Taking   amoxicillin -clavulanate (AUGMENTIN ) 875-125 MG tablet Take 1 tablet by mouth 2 (two) times daily. One po bid x 7 days 14 tablet 0 10/21/2023 Morning   busPIRone  (BUSPAR ) 15 MG tablet Take 1 tablet (15 mg total) by mouth 2 (two) times daily. 60 tablet 0    famotidine  (PEPCID ) 20 MG tablet Take 1 tablet (20 mg total) by mouth 2 (two) times daily for 7 days. 14 tablet 0    guaiFENesin  (ROBITUSSIN) 100 MG/5ML liquid Take 5-10 mLs (100-200 mg total) by mouth every 4 (four) hours as needed for cough or to loosen phlegm. 60 mL 0    loratadine  (CLARITIN ) 10 MG  tablet Take 1 tablet (10 mg total) by mouth daily for 7 days. 7 tablet 0    methocarbamol  (ROBAXIN ) 500 MG tablet Take 1 tablet (500 mg total) by mouth 2 (two) times daily as needed for muscle spasms. 20 tablet 0    naproxen  (NAPROSYN ) 500 MG tablet Take 1 tablet (500 mg total) by mouth 2 (two) times daily with a meal. 30 tablet 0    omeprazole  (PRILOSEC) 40 MG capsule Take 40mg  daily before breakfast and take additional dose before supper if needed. 180 capsule 1    Prenatal Vit-Fe Fumarate-FA (MULTIVITAMIN-PRENATAL) 27-0.8 MG TABS tablet Take 1 tablet by mouth daily at 12 noon.      Scheduled:  [MAR Hold] busPIRone   15 mg Oral BID   [MAR Hold] enoxaparin (LOVENOX) injection  40 mg Subcutaneous Q24H    Continuous:  cefoTEtan (CEFOTAN) IV     lactated ringers  125 mL/hr at 10/21/23 0109   [MAR Hold] piperacillin-tazobactam (ZOSYN)  IV 3.375 g (10/21/23 0607)   PRN:[MAR Hold] acetaminophen  **OR** [MAR Hold] acetaminophen , [MAR Hold]  morphine injection, [MAR Hold] ondansetron  **OR** [MAR Hold] ondansetron  (ZOFRAN ) IV  Allergies  Allergen Reactions   Poison Oak Extract [Poison Oak Extract] Itching and Rash   Chocolate Itching and Swelling   Egg-Derived Products Diarrhea and Other (See Comments)    High fever     ROS:  A comprehensive review of systems was negative except for: Gastrointestinal: positive for abdominal pain  Blood pressure 111/75, pulse 86, temperature 98.4 F (36.9 C), temperature source Oral, resp. rate 20, height 5\' 4"  (1.626 m), weight 109.8 kg, SpO2 100%. Physical Exam Vitals reviewed.  HENT:     Head: Normocephalic.  Eyes:     Extraocular Movements: Extraocular movements intact.  Cardiovascular:     Rate and Rhythm: Regular rhythm.  Abdominal:     General: There is no distension.     Palpations: Abdomen is soft.     Tenderness: There is abdominal tenderness in the right upper quadrant.  Musculoskeletal:     Comments: Moves all extremities   Skin:    General: Skin is warm.  Neurological:     General: No focal deficit present.     Mental Status: She is alert and oriented to person, place, and time.  Psychiatric:        Mood and Affect: Mood normal.     Results: Results for orders placed or performed during the hospital encounter of 10/20/23 (from the past 48 hours)  Urinalysis, Routine w reflex microscopic -Urine, Clean Catch     Status: Abnormal   Collection Time: 10/20/23  2:24 AM  Result Value Ref Range   Color, Urine YELLOW YELLOW   APPearance CLEAR CLEAR   Specific Gravity, Urine >1.046 (H) 1.005 - 1.030   pH 5.0 5.0 - 8.0   Glucose, UA NEGATIVE NEGATIVE mg/dL   Hgb urine dipstick NEGATIVE NEGATIVE   Bilirubin Urine NEGATIVE NEGATIVE    Ketones, ur NEGATIVE NEGATIVE mg/dL   Protein, ur NEGATIVE NEGATIVE mg/dL   Nitrite NEGATIVE NEGATIVE   Leukocytes,Ua NEGATIVE NEGATIVE    Comment: Performed at [facility], [address], [place], [place] [address]  CBC with Differential     Status: Abnormal   Collection Time: 10/20/23  9:21 PM  Result Value Ref Range   WBC 14.1 (H) 4.0 - 10.5 K/uL   RBC 4.55 3.87 - 5.11 MIL/uL   Hemoglobin 13.9 12.0 - 15.0 g/dL   HCT 60.4 54.0 - 98.1 %  MCV 91.2 80.0 - 100.0 fL   MCH 30.5 26.0 - 34.0 pg   MCHC 33.5 30.0 - 36.0 g/dL   RDW 13.2 44.0 - 10.2 %   Platelets 272 150 - 400 K/uL   nRBC 0.0 0.0 - 0.2 %   Neutrophils Relative % 79 %   Neutro Abs 11.1 (H) 1.7 - 7.7 K/uL   Lymphocytes Relative 17 %   Lymphs Abs 2.4 0.7 - 4.0 K/uL   Monocytes Relative 3 %   Monocytes Absolute 0.5 0.1 - 1.0 K/uL   Eosinophils Relative 0 %   Eosinophils Absolute 0.1 0.0 - 0.5 K/uL   Basophils Relative 1 %   Basophils Absolute 0.1 0.0 - 0.1 K/uL   Immature Granulocytes 0 %   Abs Immature Granulocytes 0.06 0.00 - 0.07 K/uL    Comment: Performed at [facility], [address], [place], [place] [address]  Comprehensive metabolic panel     Status: Abnormal   Collection Time: 10/20/23  9:21 PM  Result Value Ref Range   Sodium 135 135 - 145 mmol/L   Potassium 4.2 3.5 - 5.1 mmol/L   Chloride 104 98 - 111 mmol/L   CO2 26 22 - 32 mmol/L   Glucose, Bld 135 (H) 70 - 99 mg/dL    Comment: Glucose reference range applies only to samples taken after fasting for at least 8 hours.   BUN 11 6 - 20 mg/dL   Creatinine, Ser 6.44 0.44 - 1.00 mg/dL   Calcium 9.1 8.9 - 03.4 mg/dL   Total Protein 6.9 6.5 - 8.1 g/dL   Albumin 4.2 3.5 - 5.0 g/dL   AST 22 15 - 41 U/L   ALT 45 (H) 0 - 44 U/L   Alkaline Phosphatase 68 38 - 126 U/L   Total Bilirubin 0.5 0.0 - 1.2 mg/dL   GFR, Estimated >74 >25 mL/min    Comment: (NOTE) Calculated using the CKD-EPI Creatinine Equation (2021)    Anion gap 5 5 - 15    Comment: Performed at  [facility], [address] [place], [place] [address]  Lipase, blood     Status: None   Collection Time: 10/20/23  9:21 PM  Result Value Ref Range   Lipase 39 11 - 51 U/L    Comment: Performed at [facility], [address] [place], [place] [address]  hCG, serum, qualitative     Status: None   Collection Time: 10/20/23  9:21 PM  Result Value Ref Range   Preg, Serum NEGATIVE NEGATIVE    Comment:        THE SENSITIVITY OF THIS METHODOLOGY IS >10 mIU/mL. Performed at [facility], [address] [place], [place] [address]   Surgical PCR screen     Status: None   Collection Time: 10/20/23 11:34 PM   Specimen: Nasal Mucosa; Nasal Swab  Result Value Ref Range   MRSA, PCR NEGATIVE NEGATIVE   Staphylococcus aureus NEGATIVE NEGATIVE    Comment: (NOTE) The Xpert SA Assay (FDA approved for NASAL specimens in patients 6 years of age and older), is one component of a comprehensive surveillance program. It is not intended to diagnose infection nor to guide or monitor treatment. Performed at [facility], [address] [place], [place] [address]   Comprehensive metabolic panel     Status: Abnormal   Collection Time: 10/21/23  4:20 AM  Result Value Ref Range   Sodium 135 135 - 145 mmol/L   Potassium 3.8 3.5 - 5.1 mmol/L  Chloride 104 98 - 111 mmol/L   CO2 24 22 - 32 mmol/L   Glucose, Bld 136 (H) 70 - 99 mg/dL    Comment: Glucose reference range applies only to samples taken after fasting for at least 8 hours.   BUN 12 6 - 20 mg/dL   Creatinine, Ser 4.09 0.44 - 1.00 mg/dL   Calcium 8.4 (L) 8.9 - 10.3 mg/dL   Total Protein 6.2 (L) 6.5 - 8.1 g/dL   Albumin 3.8 3.5 - 5.0 g/dL   AST 16 15 - 41 U/L   ALT 36 0 - 44 U/L   Alkaline Phosphatase 58 38 - 126 U/L   Total Bilirubin 0.7 0.0 - 1.2 mg/dL   GFR, Estimated >81 >19 mL/min    Comment: (NOTE) Calculated using the CKD-EPI Creatinine Equation (2021)    Anion gap 7 5 - 15    Comment: Performed at [facility], [address]  [place], [place] [address]  CBC     Status: Abnormal   Collection Time: 10/21/23  4:20 AM  Result Value Ref Range   WBC 11.6 (H) 4.0 - 10.5 K/uL   RBC 4.35 3.87 - 5.11 MIL/uL   Hemoglobin 13.1 12.0 - 15.0 g/dL   HCT 95.6 21.3 - 08.6 %   MCV 92.0 80.0 - 100.0 fL   MCH 30.1 26.0 - 34.0 pg   MCHC 32.8 30.0 - 36.0 g/dL   RDW 57.8 46.9 - 62.9 %   Platelets 238 150 - 400 K/uL   nRBC 0.0 0.0 - 0.2 %    Comment: Performed at [facility], [address] [place], [place] [address]  Protime-INR     Status: None   Collection Time: 10/21/23  4:20 AM  Result Value Ref Range   Prothrombin Time 13.8 11.4 - 15.2 seconds   INR 1.0 0.8 - 1.2    Comment: (NOTE) INR goal varies based on device and disease states. Performed at [facility], [address], [place], [place] [address]    Personally reviewed the Ct and showed the patient- dilated appendix with stranding, no signs of perforation  CT ABDOMEN PELVIS W CONTRAST Result Date: 10/20/2023 CLINICAL DATA:  Right lower quadrant abdominal pain. EXAM: CT ABDOMEN AND PELVIS WITH CONTRAST TECHNIQUE: Multidetector CT imaging of the abdomen and pelvis was performed using the standard protocol following bolus administration of intravenous contrast. RADIATION DOSE REDUCTION: This exam was performed according to the departmental dose-optimization program which includes automated exposure control, adjustment of the mA and/or kV according to patient size and/or use of iterative reconstruction technique. CONTRAST:  100mL OMNIPAQUE IOHEXOL 300 MG/ML  SOLN COMPARISON:  Abdominal radiographs 03/21/2020. CT abdomen and pelvis 05/23/2019 FINDINGS: Lower chest: Lung bases are clear. Hepatobiliary: Mild diffuse fatty infiltration of the liver. No focal lesions. Gallbladder and bile ducts are normal. Pancreas: Unremarkable. No pancreatic ductal dilatation or surrounding inflammatory changes. Spleen: Normal in size without focal abnormality. Adrenals/Urinary Tract: Adrenal glands are  unremarkable. Kidneys are normal, without renal calculi, focal lesion, or hydronephrosis. Bladder is unremarkable. Stomach/Bowel: Stomach, small bowel, and colon are not abnormally distended. Mildly distended fluid-filled appendix with some wall thickening. Appendiceal diameter measures 1.1 cm. Periappendiceal stranding. Changes are consistent with early acute appendicitis. No abscess. The appendix is located posterolaterally to the cecum in the right lower quadrant. Vascular/Lymphatic: No significant vascular findings are present. No enlarged abdominal or pelvic lymph nodes. Reproductive: Uterus and bilateral adnexa are unremarkable. Other: No abdominal wall hernia or abnormality. No abdominopelvic ascites.  Musculoskeletal: No acute or significant osseous findings. IMPRESSION: 1. Acute appendicitis with appendiceal diameter measuring 1.1 cm and periappendiceal stranding present. No appendicolith or abscess. 2. No evidence of bowel obstruction. 3. Fatty infiltration of the liver. Electronically Signed   By: Boyce Byes M.D.   On: 10/20/2023 22:22     Assessment & Plan:  Dana Lane is a 28 y.o. female with acute appendicitis.   Discussed the risk of laparoscopic appendectomy and the option of antibiotics alone. Discussed that in [place] and some trials in the [place] , antibiotics are used for simple appendicitis. Discussed that research shows a 40% failure rate for antibiotics alone.  Discussed risk of surgery including but not limited to bleeding, infection, injury to other organs, normal appendix, and after this discussion the patient has decided to proceed with robotic assisted laparoscopic appendectomy.   All questions were answered to the satisfaction of the patient.   Dana Lane 10/21/2023, 11:05 AM

## 2023-10-21 NOTE — Discharge Instructions (Signed)
 Discharge Robotic Assisted Laparoscopic Surgery Instructions:  Common Complaints: Right shoulder pain is common after laparoscopic surgery. This is secondary to the gas used in the surgery being trapped under the diaphragm.  Walk to help your body absorb the gas. This will improve in a few days. Pain at the port sites are common, especially the larger port sites. This will improve with time.  Some nausea is common and poor appetite. The main goal is to stay hydrated the first few days after surgery.   Diet/ Activity: Diet as tolerated. You may not have an appetite, but it is important to stay hydrated. Drink 64 ounces of water a day. Your appetite will return with time.  Shower per your regular routine daily.  Do not take hot showers. Take warm showers that are less than 10 minutes. Rest and listen to your body, but do not remain in bed all day.  Walk everyday for at least 15-20 minutes. Deep cough and move around every 1-2 hours in the first few days after surgery.  Do not lift > 10 lbs, perform excessive bending, pushing, pulling, squatting for 1-2 weeks after surgery.  Do not pick at the dermabond glue on your incision sites.  This glue film will remain in place for 1-2 weeks and will start to peel off.  Do not place lotions or balms on your incision unless instructed to specifically by Dr. Henreitta Leber.   Pain Expectations and Narcotics: -After surgery you will have pain associated with your incisions and this is normal. The pain is muscular and nerve pain, and will get better with time. -You are encouraged and expected to take non narcotic medications like tylenol and ibuprofen (when able) to treat pain as multiple modalities can aid with pain treatment. -Narcotics are only used when pain is severe or there is breakthrough pain. -You are not expected to have a pain score of 0 after surgery, as we cannot prevent pain. A pain score of 3-4 that allows you to be functional, move, walk, and tolerate  some activity is the goal. The pain will continue to improve over the days after surgery and is dependent on your surgery. -Due to [place] law, we are only able to give a certain amount of pain medication to treat post operative pain, and we only give additional narcotics on a patient by patient basis.  -For most laparoscopic surgery, studies have shown that the majority of patients only need 10-15 narcotic pills, and for open surgeries most patients only need 15-20.   -Having appropriate expectations of pain and knowledge of pain management with non narcotics is important as we do not want anyone to become addicted to narcotic pain medication.  -Using ice packs in the first 48 hours and heating pads after 48 hours, wearing an abdominal binder (when recommended), and using over the counter medications are all ways to help with pain management.   -Simple acts like meditation and mindfulness practices after surgery can also help with pain control and research has proven the benefit of these practices.  Medication: Take tylenol and ibuprofen as needed for pain control, alternating every 4-6 hours.  Example:  Tylenol 1000mg  @ 6am, 12noon, 6pm, (Do not exceed 4000mg  of tylenol a day). Ibuprofen 800mg  @ 9am, 3pm, 9pm, 3am (Do not exceed 3600mg  of ibuprofen a day).  Take Roxicodone for breakthrough pain every 4 hours.  Take Colace for constipation related to narcotic pain medication. If you do not have a bowel movement in 2 days,  take Miralax over the counter.  Drink plenty of water to also prevent constipation.   Contact Information: If you have questions or concerns, please call our office, [phone], Monday- Thursday 8AM-5PM and Friday 8AM-12Noon.  If it is after hours or on the weekend, please call [facility] Main [facility], [phone], [phone], and ask to speak to the surgeon on call for Dr. Henreitta Leber at [facility].

## 2023-10-21 NOTE — Transfer of Care (Signed)
 Immediate Anesthesia Transfer of Care Note  Patient: Dana Lane  Procedure(s) Performed: APPENDECTOMY, ROBOT-ASSISTED, LAPAROSCOPIC (Abdomen)  Patient Location: PACU  Anesthesia Type:General  Level of Consciousness: awake  Airway & Oxygen Therapy: Patient Spontanous Breathing  Post-op Assessment: Report given to RN  Post vital signs: Reviewed and stable  Last Vitals:  Vitals Value Taken Time  BP    Temp    Pulse    Resp    SpO2      Last Pain:  Vitals:   10/21/23 1005  TempSrc: Oral  PainSc: 8       Patients Stated Pain Goal: 4 (10/21/23 1005)  Complications: No notable events documented.

## 2023-10-22 ENCOUNTER — Encounter (HOSPITAL_COMMUNITY): Payer: Self-pay | Admitting: General Surgery

## 2023-10-22 ENCOUNTER — Telehealth: Payer: Self-pay

## 2023-10-22 ENCOUNTER — Encounter: Payer: Self-pay | Admitting: *Deleted

## 2023-10-22 LAB — SURGICAL PATHOLOGY

## 2023-10-22 MED ORDER — OXYCODONE HCL 5 MG PO TABS: 5.0000 mg | ORAL_TABLET | ORAL | 0 refills | Status: AC | PRN

## 2023-10-22 MED ORDER — ONDANSETRON HCL 4 MG PO TABS: 4.0000 mg | ORAL_TABLET | Freq: Four times a day (QID) | ORAL | 0 refills | Status: AC | PRN

## 2023-10-22 NOTE — TOC Transition Note (Signed)
 Transition of Care [facility]) - Discharge Note   Patient Details  Name: Dana Lane MRN: [id number] Date of Birth: 01-29-1996  [facility] CM/SW Contact:  Orelia Binet, RN Phone Number: 10/22/2023, 11:26 AM   Clinical Narrative:   Patient discharging home, follow up appointment made with Dr Collene Dawson, Patient needs PCP, has no insurance. Patient is agreeable to the [facility] Connect program. Referral made, handout given to the patient and Add to AVS.    Final next level of care: Home/Self Care Barriers to Discharge: Barriers Resolved   Patient Goals and CMS Choice Patient states their goals for this hospitalization and ongoing recovery are:: to return home. CMS Medicare.gov Compare Post Acute Care list provided to:: Patient       Discharge Placement      Patient and family notified of of transfer: 10/22/23  Discharge Plan and Services Additional resources added to the After Visit Summary for          Social Drivers of Health (SDOH) Interventions SDOH Screenings   Food Insecurity: No Food Insecurity (10/20/2023)  Recent Concern: Food Insecurity - Medium Risk (08/05/2023)   Received from [facility]  Housing: Low Risk  (10/20/2023)  Transportation Needs: No Transportation Needs (10/20/2023)  Recent Concern: Transportation Needs - Unmet Transportation Needs (08/05/2023)   Received from [facility]  Utilities: Not At Risk (10/20/2023)  Depression (PHQ2-9): High Risk (02/19/2021)  Tobacco Use: Low Risk  (10/21/2023)  Recent Concern: Tobacco Use - Medium Risk (08/07/2023)   Received from [facility]     Readmission Risk Interventions    10/21/2023   10:14 AM  Readmission Risk Prevention Plan  Medication Screening Complete  Transportation Screening Complete

## 2023-10-22 NOTE — Discharge Summary (Signed)
 Physician Discharge Summary  Patient ID: Dana Lane MRN: [id number] DOB/AGE: 21-Apr-1996 28 y.o.  Admit date: 10/20/2023 Discharge date: 10/22/2023  Admission Diagnoses: Acute appendicitis   Discharge Diagnoses:  Principal Problem:   Appendicitis   Discharged Condition: good  Hospital Course: Dana Lane is a 28 yo with acute appendicitis. She was brought into the hospital for Iv antibiotics and surgery. She did well post operatively and stayed overnight for pain control. This Am she is feeling better and having good pain control.   She had been on amoxicillin  which was changed to Augmentin  preoperatively for a dental infection after she had a tooth pulled.   Consults:  Hospitalist admit- taken over by surgery  Significant Diagnostic Studies: CT a/p- acute appendicitis   Treatments: IV hydration, antibiotics: ceftriaxone , and surgery: Robotic appendectomy 10/21/23  Discharge Exam: Blood pressure 116/69, pulse 93, temperature 98.7 F (37.1 C), temperature source Oral, resp. rate 18, height 5\' 4"  (1.626 m), weight 109.8 kg, SpO2 100%. General appearance: alert and no distress Resp: normal work of breathing GI: soft, nondistended, appropriately tender, port sites with no erythema or drainage, bruising   Disposition: Discharge disposition: 01-Home or Self Care       Discharge Instructions     Call MD for:  difficulty breathing, headache or visual disturbances   Complete by: As directed    Call MD for:  extreme fatigue   Complete by: As directed    Call MD for:  persistant dizziness or light-headedness   Complete by: As directed    Call MD for:  persistant nausea and vomiting   Complete by: As directed    Call MD for:  redness, tenderness, or signs of infection (pain, swelling, redness, odor or green/yellow discharge around incision site)   Complete by: As directed    Call MD for:  severe uncontrolled pain   Complete by: As directed    Call MD for:  temperature >100.4    Complete by: As directed    Increase activity slowly   Complete by: As directed       Allergies as of 10/22/2023       Reactions   Bee Venom Hives, Itching, Swelling   Egg-derived Products Diarrhea, Other (See Comments)   High fever   Chocolate Itching, Swelling   Poison Oak Extract [poison Oak Extract] Itching, Rash        Medication List     TAKE these medications    amoxicillin -clavulanate 875-125 MG tablet Commonly known as: Augmentin  Take 1 tablet by mouth 2 (two) times daily. One po bid x 7 days   busPIRone  15 MG tablet Commonly known as: BUSPAR  Take 1 tablet (15 mg total) by mouth 2 (two) times daily.   fluticasone  50 MCG/ACT nasal spray Commonly known as: FLONASE  Place 2 sprays into both nostrils at bedtime.   ibuprofen  600 MG tablet Commonly known as: ADVIL  Take 600 mg by mouth every 6 (six) hours as needed for mild pain (pain score 1-3).   loratadine  10 MG tablet Commonly known as: CLARITIN  Take 1 tablet (10 mg total) by mouth daily for 7 days.   Magnesium 400 MG Tabs Take 400 mg by mouth at bedtime.   multivitamin-prenatal 27-0.8 MG Tabs tablet Take 1 tablet by mouth daily at 12 noon.   omeprazole  40 MG capsule Commonly known as: PRILOSEC Take 40mg  daily before breakfast and take additional dose before supper if needed.   ondansetron  4 MG tablet Commonly known as: ZOFRAN  Take 1 tablet (4  mg total) by mouth every 6 (six) hours as needed for nausea.   oxyCODONE  5 MG immediate release tablet Commonly known as: Oxy IR/ROXICODONE  Take 1 tablet (5 mg total) by mouth every 4 (four) hours as needed for severe pain (pain score 7-10) or breakthrough pain.        Follow-up Information     Awilda Bogus, MD Follow up on 11/03/2023.   Specialty: General Surgery Why: post op phone call, if you need to be seen in the office call Contact information: [address] Selene Dais [facility] [address] [phone]         [facility]. Schedule an  appointment as soon as possible for a visit.   Contact information: [facility]. [place], [place] [address] [phone]                Signed: Awilda Bogus 10/22/2023, 6:10 PM

## 2023-10-22 NOTE — Plan of Care (Signed)
   Problem: Education: Goal: Knowledge of General Education information will improve Description: Including pain rating scale, medication(s)/side effects and non-pharmacologic comfort measures Outcome: Progressing   Problem: Activity: Goal: Risk for activity intolerance will decrease Outcome: Progressing   Problem: Pain Managment: Goal: General experience of comfort will improve and/or be controlled Outcome: Progressing

## 2023-10-22 NOTE — Telephone Encounter (Signed)
 Received referral from Ivette Marks at [facility] to reach out to the patient. The patient was previously in the [facility] program back in 2023 before receiving insurance. Attempted to reach out to the patient today after being discharged, no answer, left voicemail.

## 2023-11-03 ENCOUNTER — Encounter: Payer: Self-pay | Admitting: General Surgery

## 2023-11-04 ENCOUNTER — Ambulatory Visit (INDEPENDENT_AMBULATORY_CARE_PROVIDER_SITE_OTHER): Payer: Self-pay | Admitting: General Surgery

## 2023-11-04 ENCOUNTER — Encounter: Payer: Self-pay | Admitting: *Deleted

## 2023-11-04 ENCOUNTER — Encounter: Payer: Self-pay | Admitting: General Surgery

## 2023-11-04 VITALS — BP 118/80 | HR 84 | Temp 98.0°F | Resp 14 | Ht 64.0 in | Wt 234.0 lb

## 2023-11-04 DIAGNOSIS — K353 Acute appendicitis with localized peritonitis, without perforation or gangrene: Secondary | ICD-10-CM

## 2023-11-04 NOTE — Patient Instructions (Signed)
 The left upper site is where the appendix is removed in the bag and is the largest site. This is closed with stitches. This will get better with time. If you are still having issues in mid June call us  and we will plan to do an US  to investigate the area.   Activity and diet as tolerated.

## 2023-11-04 NOTE — Progress Notes (Signed)
 [facility]  Pain in the left upper quadrant port site.  BP 118/80   Pulse 84   Temp 98 F (36.7 C) (Oral)   Resp 14   Ht 5\' 4"  (1.626 m)   Wt 234 lb (106.1 kg)   SpO2 97%   BMI 40.17 kg/m  Soft, port sites healing, dermabond in place, bruising around port sites, no obvious hernia or hematoma noted   APPENDIX, APPENDECTOMY:  - Acute appendicitis   Patient s/p robotic appendectomy. Pain in the left upper incision where appendix is removed.  The left upper site is where the appendix is removed in the bag and is the largest site. This is closed with stitches. This will get better with time. If you are still having issues in mid June call us  and we will plan to do an US  to investigate the area.   Activity and diet as tolerated.  Ok to return to work.    Deena Farrier, MD [facility] [address] [place]low [place], [place] [address] [phone] (office)
